# Patient Record
Sex: Female | Born: 1935 | Race: White | Hispanic: No | Smoking: Never smoker
Health system: Southern US, Community
[De-identification: ages and names within clinical notes are randomized; demographics above are authoritative.]

## PROBLEM LIST (undated history)

## (undated) DIAGNOSIS — M797 Fibromyalgia: Secondary | ICD-10-CM

## (undated) DIAGNOSIS — K219 Gastro-esophageal reflux disease without esophagitis: Secondary | ICD-10-CM

## (undated) DIAGNOSIS — K8020 Calculus of gallbladder without cholecystitis without obstruction: Secondary | ICD-10-CM

## (undated) DIAGNOSIS — G4700 Insomnia, unspecified: Secondary | ICD-10-CM

## (undated) DIAGNOSIS — E049 Nontoxic goiter, unspecified: Secondary | ICD-10-CM

## (undated) DIAGNOSIS — I10 Essential (primary) hypertension: Secondary | ICD-10-CM

## (undated) DIAGNOSIS — M1990 Unspecified osteoarthritis, unspecified site: Secondary | ICD-10-CM

## (undated) DIAGNOSIS — J189 Pneumonia, unspecified organism: Secondary | ICD-10-CM

## (undated) DIAGNOSIS — M549 Dorsalgia, unspecified: Secondary | ICD-10-CM

## (undated) DIAGNOSIS — M545 Low back pain, unspecified: Secondary | ICD-10-CM

## (undated) DIAGNOSIS — R7303 Prediabetes: Secondary | ICD-10-CM

## (undated) DIAGNOSIS — I82409 Acute embolism and thrombosis of unspecified deep veins of unspecified lower extremity: Secondary | ICD-10-CM

## (undated) DIAGNOSIS — E039 Hypothyroidism, unspecified: Secondary | ICD-10-CM

## (undated) DIAGNOSIS — H9319 Tinnitus, unspecified ear: Secondary | ICD-10-CM

## (undated) DIAGNOSIS — G8929 Other chronic pain: Secondary | ICD-10-CM

## (undated) DIAGNOSIS — R0602 Shortness of breath: Secondary | ICD-10-CM

## (undated) DIAGNOSIS — D689 Coagulation defect, unspecified: Secondary | ICD-10-CM

## (undated) DIAGNOSIS — E785 Hyperlipidemia, unspecified: Secondary | ICD-10-CM

## (undated) DIAGNOSIS — M041 Periodic fever syndromes: Secondary | ICD-10-CM

## (undated) DIAGNOSIS — Z8711 Personal history of peptic ulcer disease: Secondary | ICD-10-CM

## (undated) DIAGNOSIS — Z8719 Personal history of other diseases of the digestive system: Secondary | ICD-10-CM

## (undated) DIAGNOSIS — I809 Phlebitis and thrombophlebitis of unspecified site: Secondary | ICD-10-CM

## (undated) DIAGNOSIS — I2699 Other pulmonary embolism without acute cor pulmonale: Secondary | ICD-10-CM

## (undated) HISTORY — DX: Insomnia, unspecified: G47.00

## (undated) HISTORY — PX: HERNIA REPAIR: SHX51

## (undated) HISTORY — PX: OTHER SURGICAL HISTORY: SHX169

## (undated) HISTORY — PX: CATARACT EXTRACTION, BILATERAL: SHX1313

## (undated) HISTORY — DX: Hyperlipidemia, unspecified: E78.5

## (undated) HISTORY — PX: BREAST BIOPSY: SHX20

## (undated) HISTORY — PX: MIDDLE EAR SURGERY: SHX713

## (undated) HISTORY — DX: Coagulation defect, unspecified: D68.9

## (undated) HISTORY — DX: Other chronic pain: G89.29

## (undated) HISTORY — DX: Tinnitus, unspecified ear: H93.19

## (undated) HISTORY — DX: Dorsalgia, unspecified: M54.9

## (undated) HISTORY — DX: Acute embolism and thrombosis of unspecified deep veins of unspecified lower extremity: I82.409

## (undated) HISTORY — PX: EXTERNAL EAR SURGERY: SHX627

---

## 1958-06-09 HISTORY — PX: APPENDECTOMY: SHX54

## 1970-06-09 HISTORY — PX: ABDOMINAL HYSTERECTOMY: SHX81

## 1987-06-10 HISTORY — PX: CARPAL TUNNEL RELEASE: SHX101

## 1998-05-16 ENCOUNTER — Encounter: Admission: RE | Admit: 1998-05-16 | Discharge: 1998-08-14 | Payer: Self-pay | Admitting: Family Medicine

## 1998-08-15 ENCOUNTER — Ambulatory Visit (HOSPITAL_COMMUNITY): Admission: RE | Admit: 1998-08-15 | Discharge: 1998-08-15 | Payer: Self-pay | Admitting: Obstetrics & Gynecology

## 2000-11-10 ENCOUNTER — Ambulatory Visit (HOSPITAL_COMMUNITY): Admission: RE | Admit: 2000-11-10 | Discharge: 2000-11-10 | Payer: Self-pay | Admitting: *Deleted

## 2000-11-10 ENCOUNTER — Encounter: Payer: Self-pay | Admitting: *Deleted

## 2001-06-09 DIAGNOSIS — I2699 Other pulmonary embolism without acute cor pulmonale: Secondary | ICD-10-CM

## 2001-06-09 HISTORY — DX: Other pulmonary embolism without acute cor pulmonale: I26.99

## 2001-06-09 HISTORY — PX: THORACOTOMY: SUR1349

## 2001-06-25 ENCOUNTER — Encounter (INDEPENDENT_AMBULATORY_CARE_PROVIDER_SITE_OTHER): Payer: Self-pay | Admitting: *Deleted

## 2001-06-25 ENCOUNTER — Ambulatory Visit (HOSPITAL_COMMUNITY): Admission: RE | Admit: 2001-06-25 | Discharge: 2001-06-25 | Payer: Self-pay | Admitting: Gastroenterology

## 2001-11-29 ENCOUNTER — Encounter: Payer: Self-pay | Admitting: Family Medicine

## 2001-11-29 ENCOUNTER — Ambulatory Visit (HOSPITAL_COMMUNITY): Admission: RE | Admit: 2001-11-29 | Discharge: 2001-11-29 | Payer: Self-pay | Admitting: Family Medicine

## 2001-11-30 ENCOUNTER — Ambulatory Visit (HOSPITAL_COMMUNITY): Admission: RE | Admit: 2001-11-30 | Discharge: 2001-11-30 | Payer: Self-pay | Admitting: Family Medicine

## 2001-11-30 ENCOUNTER — Encounter: Payer: Self-pay | Admitting: Family Medicine

## 2001-12-07 HISTORY — PX: OTHER SURGICAL HISTORY: SHX169

## 2001-12-07 HISTORY — PX: MASS EXCISION: SHX2000

## 2001-12-09 ENCOUNTER — Ambulatory Visit (HOSPITAL_COMMUNITY)
Admission: RE | Admit: 2001-12-09 | Discharge: 2001-12-09 | Payer: Self-pay | Admitting: Thoracic Surgery (Cardiothoracic Vascular Surgery)

## 2001-12-24 ENCOUNTER — Encounter: Payer: Self-pay | Admitting: Thoracic Surgery (Cardiothoracic Vascular Surgery)

## 2001-12-28 ENCOUNTER — Encounter: Payer: Self-pay | Admitting: Thoracic Surgery (Cardiothoracic Vascular Surgery)

## 2001-12-28 ENCOUNTER — Encounter (INDEPENDENT_AMBULATORY_CARE_PROVIDER_SITE_OTHER): Payer: Self-pay | Admitting: *Deleted

## 2001-12-28 ENCOUNTER — Inpatient Hospital Stay (HOSPITAL_COMMUNITY)
Admission: RE | Admit: 2001-12-28 | Discharge: 2002-01-01 | Payer: Self-pay | Admitting: Thoracic Surgery (Cardiothoracic Vascular Surgery)

## 2001-12-29 ENCOUNTER — Encounter: Payer: Self-pay | Admitting: Thoracic Surgery (Cardiothoracic Vascular Surgery)

## 2001-12-30 ENCOUNTER — Encounter: Payer: Self-pay | Admitting: Thoracic Surgery (Cardiothoracic Vascular Surgery)

## 2001-12-31 ENCOUNTER — Encounter: Payer: Self-pay | Admitting: Thoracic Surgery (Cardiothoracic Vascular Surgery)

## 2001-12-31 ENCOUNTER — Encounter: Payer: Self-pay | Admitting: Internal Medicine

## 2002-01-01 ENCOUNTER — Encounter: Payer: Self-pay | Admitting: Thoracic Surgery (Cardiothoracic Vascular Surgery)

## 2002-01-19 ENCOUNTER — Encounter: Payer: Self-pay | Admitting: Thoracic Surgery (Cardiothoracic Vascular Surgery)

## 2002-01-19 ENCOUNTER — Encounter
Admission: RE | Admit: 2002-01-19 | Discharge: 2002-01-19 | Payer: Self-pay | Admitting: Thoracic Surgery (Cardiothoracic Vascular Surgery)

## 2002-01-19 ENCOUNTER — Inpatient Hospital Stay (HOSPITAL_COMMUNITY)
Admission: AD | Admit: 2002-01-19 | Discharge: 2002-01-25 | Payer: Self-pay | Admitting: Thoracic Surgery (Cardiothoracic Vascular Surgery)

## 2002-02-09 ENCOUNTER — Encounter: Payer: Self-pay | Admitting: Thoracic Surgery (Cardiothoracic Vascular Surgery)

## 2002-02-09 ENCOUNTER — Encounter
Admission: RE | Admit: 2002-02-09 | Discharge: 2002-02-09 | Payer: Self-pay | Admitting: Thoracic Surgery (Cardiothoracic Vascular Surgery)

## 2002-03-02 ENCOUNTER — Ambulatory Visit (HOSPITAL_COMMUNITY): Admission: RE | Admit: 2002-03-02 | Discharge: 2002-03-02 | Payer: Self-pay

## 2002-05-02 ENCOUNTER — Ambulatory Visit (HOSPITAL_COMMUNITY): Admission: RE | Admit: 2002-05-02 | Discharge: 2002-05-02 | Payer: Self-pay | Admitting: Family Medicine

## 2002-05-02 ENCOUNTER — Encounter: Payer: Self-pay | Admitting: Family Medicine

## 2002-08-25 ENCOUNTER — Ambulatory Visit: Admission: RE | Admit: 2002-08-25 | Discharge: 2002-08-25 | Payer: Self-pay | Admitting: Family Medicine

## 2002-11-02 ENCOUNTER — Ambulatory Visit (HOSPITAL_COMMUNITY): Admission: RE | Admit: 2002-11-02 | Discharge: 2002-11-02 | Payer: Self-pay | Admitting: Family Medicine

## 2002-11-02 ENCOUNTER — Encounter: Payer: Self-pay | Admitting: Family Medicine

## 2003-06-27 ENCOUNTER — Emergency Department (HOSPITAL_COMMUNITY): Admission: EM | Admit: 2003-06-27 | Discharge: 2003-06-28 | Payer: Self-pay | Admitting: Emergency Medicine

## 2003-06-30 ENCOUNTER — Inpatient Hospital Stay (HOSPITAL_COMMUNITY): Admission: RE | Admit: 2003-06-30 | Discharge: 2003-07-07 | Payer: Self-pay | Admitting: Family Medicine

## 2003-07-03 ENCOUNTER — Encounter (INDEPENDENT_AMBULATORY_CARE_PROVIDER_SITE_OTHER): Payer: Self-pay | Admitting: Cardiology

## 2004-08-05 ENCOUNTER — Encounter (INDEPENDENT_AMBULATORY_CARE_PROVIDER_SITE_OTHER): Payer: Self-pay | Admitting: *Deleted

## 2004-08-05 ENCOUNTER — Ambulatory Visit (HOSPITAL_COMMUNITY): Admission: RE | Admit: 2004-08-05 | Discharge: 2004-08-05 | Payer: Self-pay | Admitting: Gastroenterology

## 2006-11-13 ENCOUNTER — Ambulatory Visit: Payer: Self-pay | Admitting: Vascular Surgery

## 2010-10-25 NOTE — Discharge Summary (Signed)
[place]. [facility]  Patient:    Debbie Richards, Debbie Richards Visit Number: [id number] MRN: [id number]          Service Type: SUR Location: 2000 2015 01 Attending Physician:  Charlett Lango Dictated by:   Adair Patter, P.A. Admit Date:  12/28/2001 Discharge Date: 01/01/2002                             Discharge Summary  DATE OF BIRTH:  [birth year]  ADMITTING DIAGNOSES: 1. Paravertebral mass. 2. Anterior mediastinal mass.  DISCHARGE DIAGNOSES: 1. Bronchogenic cyst. 2. Thyroid goiter.  HOSPITAL COURSE:  The patient was admitted to [facility] on December 28, 2001, after being seen and evaluated by Dr. Viviann Spare C. Hendrickson for a paravertebral mass and anterior mediastinal mass.  Because of this, Dr. Dorris Fetch performed a right video-assisted thoracoscopy with mini-thoracotomy and resection of paravertebral mass and a needle biopsy of the anterior mediastinal mass.  No complications noted during the procedure. Postoperatively, the patient had a relatively uneventful hospital course.  She was able to tolerate regular diet postoperatively.  Pathology revealed that she had a bronchogenic cyst and thyroid goiter.  Hemoglobin and hematocrit were stable at 11.2 and 33.7.  BUN and creatinine were 9 and 1.0.  She was subsequently deemed stable for discharge on January 01, 2002.  DISCHARGE MEDICATIONS: 1. Tylox 1-2 tablets every 4-6 hours as needed for pain. 2. Flexeril 10 mg one daily at bedtime. 3. Premarin 0.625 mg one daily. 4. Atenolol 25 mg one daily. 5. Protonix 40 mg one daily. 6. Colchicine 0.6 mg one daily. 7. Voltaren 75 mg one tablet twice daily.  DISCHARGE ACTIVITIES:  The patient was told no driving, no strenuous activity or lifting heavy objects.  She was told to walk daily and continue breathing exercises.  DISCHARGE DIET:  Low fat and low salt.  WOUND CARE:  The patient was told she could shower and clean her incision with soap and  water.  DISPOSITION:  Home.  FOLLOWUP:  The patient was told to see Dr. Dorris Fetch on January 19, 2002 at 10:45 a.m.  She was told to go to the [facility] one hour before this appointment for chest x-ray. Dictated by:   Adair Patter, P.A. Attending Physician:  Charlett Lango DD:  12/31/01 TD:  01/04/02 Job: [id number]

## 2010-10-25 NOTE — Op Note (Signed)
[place]. [facility]  Patient:    Debbie Richards, Debbie Richards Visit Number: [id number] MRN: [id number]          Service Type: SUR Location: 2000 2015 01 Attending Physician:  Charlett Lango Proc. Date: 12/28/01 Admit Date:  12/28/2001                             Operative Report  INDICATION:  [name] is a 77 year old white female with the diagnosis of a paravertebral chest mass who is scheduled for thoracotomy/thoracoscopy with resection or biopsy of the chest mass and for whom epidural anesthesia required in the postoperative period.  DESCRIPTION OF PROCEDURE:  After termination of surgery still under general anesthesia, [name] remains in the left lateral decubitus position.  Her back is prepped with Betadine and draped in the usual sterile fashion.  Using a midline approach and loss of resistance technique, a peritoneal tap was accomplished at the T11 and T12 interspace with the 17 gauge Tuohy needle. After aspiration for blood and CSF were negative, a total of 10 cc of 1% lidocaine were infused.  This was followed by the passage of a peridural catheter at 20 cc cephalad.  The catheter was then checked for patency and affixed to the patients back.  The patient was then returned to the supine position, awakened, extubated, and brought to the post anesthesia care unit where her peridural catheter will be connected to a mixture of fentanyl 5 mcg per cc and Marcaine at an initial rate of 12 cc an hour to be adjusted as needed.  There were no complications.  She did do well and will be followed in the usual fashion. Attending Physician:  Charlett Lango DD:  12/28/01 TD:  12/31/01 Job: [id number]

## 2010-10-25 NOTE — Discharge Summary (Signed)
   NAME:  Debbie Richards, Debbie Richards                        ACCOUNT NO.:  [id number]   MEDICAL RECORD NO.:  [id number]                   PATIENT TYPE:  INP   LOCATION:  5742                                 FACILITY:  [facility]   PHYSICIAN:  Myrlene Broker, P.A.                DATE OF BIRTH:  1930/06/25   DATE OF ADMISSION:  01/19/2002  DATE OF DISCHARGE:  01/25/2002                                 DISCHARGE SUMMARY   ADDENDUM:  This patient was initially ready for discharge pending a  therapeutic INR on January 24, 2002. Nevertheless her INR was only 1.7.  On  January 25, 2002, her INR is 2.1. She will be going home today in stable  condition with her primary care physician following her Coumadin. She was  instructed to have her Coumadin level checked in a couple days at her  primary care physician's. She was to take 10 mg on January 25, 2002, and she  will have her blood drawn tomorrow at Dr. Otelia Santee office, and he  will let her know how much to take after that. All other medications are the  same. Her office will notify her of a followup time.                                               Myrlene Broker, P.A.    CML/MEDQ  D:  01/25/2002  T:  01/27/2002  Job:  [id number]   cc:   Meredith Staggers, M.D.

## 2010-10-25 NOTE — Op Note (Signed)
[place]. [facility]  Patient:    Debbie Richards, Debbie Richards Visit Number: [id number] MRN: [id number]          Service Type: SUR Location: 2000 2015 01 Attending Physician:  Charlett Lango Dictated by:   Salvatore Decent. Dorris Fetch, M.D. Proc. Date: 12/28/01 Admit Date:  12/28/2001 Discharge Date: 01/01/2002   CC:         Meredith Staggers, M.D.   Operative Report  PREOPERATIVE DIAGNOSIS:  Posterior mediastinal mass.  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  PROCEDURE: 1. Right thoracotomy with video assistance. 2. Resection of esophageal duplication cyst. 3. Biopsy of anterior mediastinal mass.  SURGEON:  Salvatore Decent. Dorris Fetch, M.D.  ASSISTANTS: 1. D. Karle Plumber, M.D. 2. Lissa Merlin, P.A.  ANESTHESIA:  General.  FINDINGS:  Approximately 4 cm in diameter thin-walled simple cyst in the posterior mediastinum, in close proximity to the esophagus.  No true communication with bronchus or esophagus identified.  Tru-Cut needle biopsies performed of anterior mediastinal mass which was vascular and had the appearance of thyroid.  CLINICAL NOTE:  The patient is a 77 year old female who presented with right-sided rib pain.  Her evaluation included a chest x-ray which showed possible mediastinal adenopathy.  A CT scan was performed which revealed a posterior mediastinal mass in a paravertebral location, as well as a right peritracheal mass which appeared to be in continuity with the thyroid gland. The patient was referred for diagnosis and treatment.  It is felt that the symptoms most likely were related to the posterior mediastinal mass given their radicular nature.  The plan was for resection of the posterior mass with a possible attempt at biopsy of the anterior mediastinal mass if possible. The indications, risks, benefits and alternative  treatments were discussed in detail with the patient.  She understood and accepted the risks and agreed to proceed.  OPERATIVE NOTE:  The patient was brought to the preop holding area on 12/28/01. Intravenous access was obtained.  Arterial blood pressure monitoring catheter was placed.  The patient was taken to the operating room, anesthetized and intubated with a double-lumen endotracheal tube.  Intravenous antibiotics were administered.  PAS hose were placed for DVT prophylaxis.  A Foley catheter was placed.  The patient was placed in the left lateral decubitus position, and the right chest was prepped and draped in usual fashion.  Single lung ventilation was carried out.  The left lung was ventilated.  The right lung was deflated.  Transverse incision was made in approximately the seventh intercostal space, carried through the skin and subcutaneous tissue.  The chest then was entered bluntly using a hemostat.  A port was inserted and the thoracoscope was inserted.  There was good exposure with good deflation of the right lung. Additional incisions were made in approximately the fifth interspace anteriorly and posteriorly for instrumentation.  In both cases, the chest was entered using thoracoscopic visualization.  The right upper lobe was grasped and retracted anteromedially to expose the right paravertebral space.  There was a smooth wall of what appeared to be a simple cyst in that region. Dr. Franky Macho from neurosurgery was asked to see this and evaluate the CT scans, to make sure this was not a syringocele prior to resection.  He felt that was unlikely given its location anterior to the vertebral body.  This most likely represented a bronchogenic cyst or esophageal duplication cyst.  The pleura was incised around the cyst.  There were large feeding veins which were displaced by the cyst, but the veins themselves were not in communication with the cyst or part of the cyst wall.  These  were doubly clipped and divided. During the dissection of the cyst, the thin wall ruptured.  There was clear fluid within the cyst with some fibrinous material as well.  The azygous vein was very carefully dissected off the cyst.  Bleeding from the azygous vein at one point in the dissection required repair with a 4-0 Prolene figure-of-eight suture.  The cyst then was continued to be dissected out using sharp and blunt dissection.  It was in close proximity to the esophagus but no true communication with the esophagus or bronchus could be established.  The base of the cyst was suture ligated with a 2-0 silk suture, and the cyst was removed and sent for permanent pathology.  Hemostasis was achieved.  Next the pleura was incised over the right peritracheal region.  Multiple normal-appearing lymph nodes were identified.  The right peritracheal mass was identified.  This appeared consistent with thyroid tissue.  Tru-Cut needle biopsies were performed and sent for permanent section.  Bleeding was controlled with direct pressure using a sponge stick.  The chest was irrigated with one liter of warm normal saline containing 1 g of vancomycin.  Hemostasis was achieved.  A single 36 French right-angled chest tube was placed through the seventh intercostal space incision and directed posteriorly along the paravertebral gutter overlying the esophagus near the dissection.  It was secured with a #1 silk suture.  The fourth interspace thoracotomy then was closed with interrupted #2 Vicryl pericostal sutures.  The lung was reinflated.  The serratus muscle was reattached laterally using a running 0 Vicryl suture.  The serratus was spared with a thoracotomy.  The latissimus fascia was closed with a running #1 Vicryl suture.  Subcutaneous tissue was closed with running 2-0 Vicryl suture, and the skin was closed with a 3-0 Vicryl subcuticular suture.  The remaining anterior port site was closed in standard  fashion with a 2-0 Vicryl fascial suture, a subcutaneous suture and a  3-0 Vicryl subcuticular suture.  The patient remained hemodynamically stable.  Intercostal blocks were performed with 0.25% Marcaine and epidural catheter then was placed by the anesthesiologist for postoperative pain control.  The patient was placed back in the supine position, was extubated in the operating room and taken to the postanesthetic care unit in stable condition.  All sponge, needle and instrument counts were correct at the end of the procedure.  There were no intraoperative complications. Dictated by:   Salvatore Decent Dorris Fetch, M.D. Attending Physician:  Charlett Lango DD:  12/28/01 TD:  12/31/01 Job: [id number]

## 2010-10-25 NOTE — Discharge Summary (Signed)
NAME:  Debbie Richards, Debbie Richards                        ACCOUNT NO.:  [id number]   MEDICAL RECORD NO.:  [id number]                   PATIENT TYPE:  INP   LOCATION:  5742                                 FACILITY:  [facility]   PHYSICIAN:  Salvatore Decent. Dorris Fetch, M.D.         DATE OF BIRTH:  Dec 08, 1930   DATE OF ADMISSION:  01/19/2002  DATE OF DISCHARGE:  01/25/2002                                 DISCHARGE SUMMARY   PRIMARY ADMISSION DIAGNOSIS:  Right deep venous thrombosis.   SECONDARY DIAGNOSES/PAST MEDICAL HISTORY:  1. Hypertension.  2. __________ .  3. Hiatal hernia.  4. Osteoarthritis.  5. Status post right video-assisted thoracic surgery/mini-thoracotomy with     removal of bronchogenic cyst.   NEW DIAGNOSES/DISCHARGE DIAGNOSES:  1. Right deep venous thrombosis.  2. Hypertension.  3. __________ .  4. Hiatal hernia.  5. Osteoarthritis.  6. Status post right video-assisted thoracic surgery/mini-thoracotomy with     removal of bronchogenic cyst.   PROCEDURES:  None.   HOSPITAL COURSE:  The patient was admitted on January 19, 2002 after being  seen at a followup appointment at Dr. Viviann Spare C. Hendrickson's office and was  found to have some right lower extremity swelling.  A venous Duplex was done  and revealed a right deep venous thrombosis.  The patient was admitted on  August 13th for IV heparinization, to keep her legs elevated and bedrest.  Heparin was started as well as Coumadin and the patient remained stable from  a cardiac as well as pulmonary standpoint.  She was ambulating without  difficulty, tolerating a regular diet.  Her INR had slowly increased.  Upon  the time it is greater than 2.0, she will be stable for discharge, which is  anticipated on January 24, 2002.   DISCHARGE CONDITION:  Stable.   DISCHARGE MEDICATIONS:  1. Flexeril 10 mg p.o. daily.  2. Premarin 0.625 mg daily.  3. Atenolol 25 mg daily.  4. Protonix 30 mg daily.  5. Colchicine 0.5 mg daily.  6.  Voltaren 75 mg twice a day.  7. Darvocet one to two tablets every four to six hours as needed for pain.   ACTIVITY:  The patient is instructed not to do any strenuous activity and to  keep her legs elevated.   DIET:  She is to continue her diet as before.   WOUND CARE:  There is no applicable wound care.   SPECIAL DISCHARGE INSTRUCTIONS:  She is to notify the office of any  temperature of 101 or if there is any increased swelling or pain in her legs  or if she has any increased shortness of breath.   FOLLOWUP:  She is to see Dr. Dorris Fetch in approximately two weeks and our  office will call her with that appointment date and time.     Myrlene Broker, P.A.  Salvatore Decent Dorris Fetch, M.D.    CML/MEDQ  D:  01/23/2002  T:  01/26/2002  Job:  [id number]

## 2010-10-25 NOTE — H&P (Signed)
NAME:  Debbie Richards, Debbie Richards                        ACCOUNT NO.:  [id number]   MEDICAL RECORD NO.:  [id number]                   PATIENT TYPE:  INP   LOCATION:  5742                                 FACILITY:  [facility]   PHYSICIAN:  Myrlene Broker, P.A.                DATE OF BIRTH:  15-Mar-1931   DATE OF ADMISSION:  01/19/2002  DATE OF DISCHARGE:                                HISTORY & PHYSICAL   CHIEF COMPLAINT:  Right deep venous thrombosis of the lower extremity.   HISTORY OF PRESENT ILLNESS:  This is a 77 year old Caucasian female who is  status post a right video assisted thoracoscopy with mini thoracotomy and  later diagnosed with a bronchogenic cyst.  She was sent home on January 01, 2002, after an unremarkable hospital admission.  Since she has been home,  she has been doing okay, although she has been having intermittent fevers  and her shortness of breath has persisted.  On follow-up visit today at Dr.  Viviann Spare C. Hendrickson's office, she was found to have a right lower  extremity deep venous thrombosis.   PAST MEDICAL HISTORY:  1. Hypertension.  2. Mediterranean fever.  3. Hiatal hernia.  4. Osteoarthritis.   PAST SURGICAL HISTORY:  1. On December 28, 2001, right VATS/mini thoracotomy.  2. Appendectomy.  3. Complete hysterectomy.  4. Three ear operations on the left side.  5. Ventral hernia repair.  6. Bladder tacking.  7. Carpal tunnel repair.   FAMILY HISTORY:  The patient's mother is deceased from congestive heart  failure at 6.  The patient's father is deceased from a stroke at 59.  The  patient's brother has heart disease.  There is no history of deep venous  thrombosis.   SOCIAL HISTORY:  The patient denies any tobacco, alcohol, or illicit drug  use.  She is widowed.  She has two children.  She is retired.  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   MEDICATIONS:  1. Flexeril 10 mg one tablet at bedtime.  2. Premarin 0.625 mg daily.  3. Atenolol 25 mg  daily.  4. Protonix 40 mg daily.  5. Colchicine 0.6 mg daily.  6. Voltaren 75 mg twice a day.   ALLERGIES:  No known drug allergies.   REVIEW OF SYMPTOMS:  GENERAL:  The patient states that she has had  intermittent fevers anywhere from 101-102 degrees since she has been home.  She denies any fatigue.  She does have some decreased appetite.  There have  been no weight changes.  HEENT:  Denies any headache, eyes, ears, nose, or  throat problems.  PULMONARY:  She does describe being short of breath with  exertion.  Denies any cough or hemoptysis.  She still is complaining of some  pain secondary to her surgery which inhibits her ability to take a deep  breath.  CARDIOVASCULAR:  She denies  any chest pain or unstable angina.  She  does have sharp pains intermittently, which is felt to be related to her  recent thoracotomy.  She denies any orthopnea, palpitations, history of  arrhythmia, coronary artery disease, or myocardial infarction.  GASTROINTESTINAL:  The patient denies any abdominal pain, history of GI  bleed, ulcers, or melena.  She does have a history of benign polyps which  have been removed, as well as a history of hemorrhoids.  GENITOURINARY:  She  denies any dysuria, hematuria, and frequency of urination.  ENDOCRINE:  She  denies any diabetes.  She does not have thyroid disease, although she does  have a benign growth on her thyroid gland.  RENAL:  She denies any history  of kidney disease.  CIRCULATORY:  Denies any history of peripheral vascular  disease or cranial, carotid, or cerebrovascular occlusive disease.  SKIN AND  MUSCULOSKELETAL:  She has a history of osteoarthritis.  No history of any  rashes.  NEUROLOGIC:  She denies any history of strokes, TIAs, amaurosis  fugax, or weakness over one side of her body more than the other.   PHYSICAL EXAMINATION:  VITAL SIGNS:  The blood pressure is 124/53, pulse 74,  respiratory rate 20, and temperature 97.9 degrees.  GENERAL  APPEARANCE:  This is a 77 year old Caucasian female who is mildly  obese.  She is in no acute distress.  She is alert and oriented x 3.  Sitting upright in a hospital bed.  HEENT:  The head is normocephalic and atraumatic.  The eyes are PERRLA/EOMI  without any cataracts, glaucoma, or macular degeneration.  NECK:  Supple without JVD, bruits, lymphadenopathy, or thyromegaly.  CHEST:  Clear to auscultation bilaterally without any rales, rhonchi, or  wheezes.  Her right lateral chest posteriorly demonstrates a well-healed  mini thoracotomy site, as well as chest tube side.  HEART:  Regular rate and rhythm without murmur, rub, or gallop.  ABDOMEN:  Mildly obese, soft, nontender, and nondistended.  She has positive  bowel sounds.  There are no masses.  EXTREMITIES:  The right lower extremity demonstrates swelling greater than  the left.  She does have calf tenderness in the right side, as well as  behind her knee.  She does have a positive Homan's sign.  She has 2+  dorsalis pedis and 2+ radial pulses bilaterally.  NEUROLOGIC:  She is alert and oriented x 3.  Her cranial nerves II-XII are  grossly intact.   LABORATORY DATA:  Currently pending.   IMPRESSION:  Right deep venous thrombosis, status post right mini  thoracotomy.   PLAN:  She is to be admitted to the hospital for IV heparinization, to keep  her legs elevated, and at bed rest for resolution of this deep venous  thrombosis, as well as investigation of this fever that she has been having.                                               Myrlene Broker, P.A.    CML/MEDQ  D:  01/19/2002  T:  01/22/2002  Job:  [id number]   cc:   [facility] Office

## 2010-10-25 NOTE — Op Note (Signed)
NAMENATSUMI, WHITSITT NO.:  [id number]   MEDICAL RECORD NO.:  [id number]          PATIENT TYPE:  AMB   LOCATION:  ENDO                         FACILITY:  [facility]   PHYSICIAN:  John C. Madilyn Fireman, M.D.    DATE OF BIRTH:  08-06-1930   DATE OF PROCEDURE:  08/05/2004  DATE OF DISCHARGE:                                 OPERATIVE REPORT   PROCEDURE:  Colonoscopy with polypectomy.   INDICATIONS FOR PROCEDURE:  History of adenomatous colon polyps on initial  colonoscopy three years ago.   DESCRIPTION OF PROCEDURE:  The patient was placed in the left lateral  decubitus position and placed on the pulse monitor with continuous low flow  oxygen delivered by nasal cannula.  She was sedated with 87.5 mcg of IV  Fentanyl and 8 mg of IV Versed.  The Olympus videocolonoscope was inserted  into the rectum and advanced to the cecum, confirmed by transillumination of  McBurney's point and visualization of the ileocecal valve and appendiceal  orifice.  The prep was excellent.  The cecum and ascending colon appeared  normal, with no masses, polyps, diverticula, or other mucosal abnormalities.  Within the transverse colon, there was an 8-mm sessile polyp which was  removed by snare.  The remainder of the transverse, descending, sigmoid, and  rectum appeared normal, with no further polyps, masses, diverticula, or  other mucosal abnormalities.  The scope was then withdrawn and the patient  returned to the recovery room in stable condition.  She tolerated the  procedure well, and there were no immediate complications.   IMPRESSION:  Transverse polyp.  Otherwise normal study.   PLAN:  Will await histology and probably repeat colonoscopy in five years.      JCH/MEDQ  D:  08/05/2004  T:  08/05/2004  Job:  [id number]   cc:   Deatra James, M.D.  Fax: [phone]

## 2010-10-25 NOTE — Procedures (Signed)
[facility]  Patient:    Debbie Richards, Debbie Richards Visit Number: [id number] MRN: [id number]          Service Type: END Location: ENDO Attending Physician:  Louie Bun Dictated by:   Everardo All Madilyn Fireman, M.D. Proc. Date: 06/25/01 Admit Date:  06/25/2001 Discharge Date: 06/25/2001   CC:         Meredith Staggers, M.D.   Procedure Report  PROCEDURE:  Colonoscopy with polypectomy.  INDICATION FOR PROCEDURE:  Screening colonoscopy in a 77 year old patient with no previous colon screening.  DESCRIPTION OF PROCEDURE:  The patient was placed in the left lateral decubitus position and placed on the pulse monitor with continuous low-flow oxygen delivered by nasal cannula.  She was sedated with 85 mg of IV Demerol and 7 mg of IV Versed.  The Olympus video colonoscope was inserted into the rectum and advanced to the cecum, confirmed by transillumination at McBurneys point and visualization of the ileocecal valve and appendiceal orifice.  The prep was excellent.  The cecum appeared normal with no masses, polyps, diverticula, or other mucosal abnormalities.  Within the ascending colon, there was seen an 8 mm sessile polyp which was fulgurated by hot biopsy.  The remainder of the ascending, transverse, and proximal descending colon appeared normal with no further polyps, masses, diverticula or other mucosal abnormalities.  Within the sigmoid colon there were seen several diverticula and no other abnormalities.  The rectum appeared normal, and retroflexed view of the anus revealed no obvious internal hemorrhoids.  The colonoscope was then withdrawn, and the patient returned to the recovery room in stable condition.  She tolerated the procedure well, and there were no immediate complications.  IMPRESSION: 1. Small ascending colon polyp. 2. Sigmoid diverticulosis.  PLAN:  Await histology for determination of method and interval for future colon  screening. Dictated by:   Everardo All Madilyn Fireman, M.D. Attending Physician:  Louie Bun DD:  06/25/01 TD:  06/27/01 Job: [id number]

## 2010-10-25 NOTE — Discharge Summary (Signed)
NAME:  Debbie Richards, Debbie Richards                        ACCOUNT NO.:  [id number]   MEDICAL RECORD NO.:  [id number]                   PATIENT TYPE:  INP   LOCATION:  0340                                 FACILITY:  [facility]   PHYSICIAN:  Deirdre Peer. Polite, M.D.              DATE OF BIRTH:  09/22/1930   DATE OF ADMISSION:  06/30/2003  DATE OF DISCHARGE:  07/07/2003                                 DISCHARGE SUMMARY   DISCHARGE DIAGNOSES:  1. Pulmonary embolus right upper lobe, right lower lobe. Left leg deep     venous thrombosis. INR at discharge 2.0.  2. History of goiter.  3. History of bronchogenic cyst.  4. History of __________ fever.  5. Hypertension.  6. History of direct visualization in August 2003.   DISCHARGE MEDICATIONS:  1. Coumadin 10 mg q.d. further dosing based on INR.  2. Atenolol 25 mg p.o. q.d.  3. Protonix 40 mg q.d.  4. __________ 0.6 mg q.d.  5. Synthroid 75 mcg q.d.   DISPOSITION:  The patient was discharged to home and asked to followup with  primary M.D. in one week.  The patient asked to have PT and INR measured in  one day.   HISTORY OF PRESENT ILLNESS:  A 77 year old female with above medical  problems who presented to the ED for evaluation of chest discomfort. The  patient had a CT which was significant for pulmonary embolus.  Admission was  recommended for further evaluation and treatment.   PAST MEDICAL HISTORY:  As stated above.   MEDICATIONS ON ADMISSION:  As stated above.   SOCIAL HISTORY:  Negative for tobacco, alcohol or drugs.   FAMILY HISTORY:  Noncontributory.   HOSPITAL COURSE:  #1.  PE, left leg DVT.  As stated Ms. Wilmott has had DVT in  the past August 2003 and now presents to the hospital for evaluation and has  been diagnosed with a PE.  The patient was treated with subcu Lovenox and  Coumadin. Because of no precipitating causes, hypocoagulable evaluation has  been obtained.  Studies are pending at the time of this dictation. The  patient's  hospital course was without complications; however, her INR was  very slow to rise. The patient was admitted on January 21. The patient's INR  did not become therapeutic until January 28.  Throughout this  hospitalization, there were no bleeding tendencies. As this was the  patient's second DVT, it is most likely recommended that she will require  prolonged i.e. lifelong anticoagulation.  As for the patient's  hypercoagulable studies, antithrombin 3 study 87 within normal limits,  protein C11, 70, protein C function 121,  protein S total 87, protein S function 97 above within normal limits.  Anticardiolipin antibody none detected.  As stated the patient is ready for  discharge at this time and will followup with her primary M.D. for further  evaluation of PT, INR.  Deirdre Peer. Polite, M.D.    RDP/MEDQ  D:  07/07/2003  T:  07/08/2003  Job:  [id number]

## 2010-10-25 NOTE — Discharge Summary (Signed)
   NAME:  Debbie Richards, Debbie Richards                        ACCOUNT NO.:  [id number]   MEDICAL RECORD NO.:  [id number]                   PATIENT TYPE:  INP   LOCATION:  2015                                 FACILITY:  [facility]   PHYSICIAN:  Salvatore Decent. Dorris Fetch, M.D.         DATE OF BIRTH:  25-Aug-1930   DATE OF ADMISSION:  12/28/2001  DATE OF DISCHARGE:  01/01/2002                                 DISCHARGE SUMMARY   ADMIT DIAGNOSES:  1. Paravertebral mass.  2. Anterior mediastinal mass.   DISCHARGE DIAGNOSES:  1. Paravertebral mass.  2. Anterior mediastinal mass.   HOSPITAL COURSE:  The patient was admitted to [facility] on December 28, 2001 after being seen and evaluated by Dr. Viviann Spare C. Hendrickson  secondary to admission diagnoses.  Because of this, Dr. Dorris Fetch  performed a right video-assisted thoracoscopy with minithoracotomy and  resection of paravertebral mass and needle biopsy of anterior mediastinal  mass.  No complications were noted during procedure.  Postoperatively, the  patient had an uneventful hospital course.  Her chest tubes were  discontinued appropriately.  Her pathology was consistent with a  bronchogenic cyst and the anterior mediastinal mass was consistent with a  thyroid goiter.  She was subsequently deemed stable for discharge home on  January 01, 2002.   MEDICATIONS AT TIME OF DISCHARGE:  1. Tylox one to two tablets every four to six hours as needed for pain.  2. Flexeril 10 mg one daily.  3. Premarin 0.625 mg one daily.  4. Atenolol 25 mg one daily.  5. Protonix 40 mg one daily.  6. Colchicine 0.6 mg one daily.  7. Voltaren 75 mg one tablet twice daily.   ACTIVITY:  The patient was told to avoid driving and strenuous activity  until seen in the office.  She was told to walk daily and continue breathing  exercises.   DISCHARGE DIET:  Low fat, low salt.   WOUND CARE:  The patient was told she could shower and clean her incision  with soap and  water.   DISPOSITION:  Home.   FOLLOWUP:  The patient was told to see Dr. Dorris Fetch on Wednesday, January 19, 2002 at 10:45 a.m.  She was told to go to the [facility] one hour before this appointment for chest x-ray.    Levin Erp. Steward, P.A.                      Salvatore Decent Dorris Fetch, M.D.   Henrene Hawking  D:  02/09/2002  T:  02/10/2002  Job:  [id number]

## 2011-07-03 DIAGNOSIS — Z7901 Long term (current) use of anticoagulants: Secondary | ICD-10-CM | POA: Diagnosis not present

## 2011-07-03 DIAGNOSIS — E7800 Pure hypercholesterolemia, unspecified: Secondary | ICD-10-CM | POA: Diagnosis not present

## 2011-07-31 DIAGNOSIS — E7800 Pure hypercholesterolemia, unspecified: Secondary | ICD-10-CM | POA: Diagnosis not present

## 2011-07-31 DIAGNOSIS — Z131 Encounter for screening for diabetes mellitus: Secondary | ICD-10-CM | POA: Diagnosis not present

## 2011-07-31 DIAGNOSIS — I4891 Unspecified atrial fibrillation: Secondary | ICD-10-CM | POA: Diagnosis not present

## 2011-08-14 DIAGNOSIS — Z7901 Long term (current) use of anticoagulants: Secondary | ICD-10-CM | POA: Diagnosis not present

## 2011-08-22 DIAGNOSIS — Z7901 Long term (current) use of anticoagulants: Secondary | ICD-10-CM | POA: Diagnosis not present

## 2011-08-29 DIAGNOSIS — Z7901 Long term (current) use of anticoagulants: Secondary | ICD-10-CM | POA: Diagnosis not present

## 2011-08-29 DIAGNOSIS — I4891 Unspecified atrial fibrillation: Secondary | ICD-10-CM | POA: Diagnosis not present

## 2011-09-09 ENCOUNTER — Encounter (HOSPITAL_COMMUNITY): Payer: Self-pay

## 2011-09-09 ENCOUNTER — Inpatient Hospital Stay (HOSPITAL_COMMUNITY)
Admission: EM | Admit: 2011-09-09 | Discharge: 2011-09-18 | DRG: 418 | Disposition: A | Discharge status: Home / Self Care | Payer: Medicare Other | Attending: General Surgery | Admitting: General Surgery

## 2011-09-09 ENCOUNTER — Emergency Department (HOSPITAL_COMMUNITY): Payer: Medicare Other

## 2011-09-09 ENCOUNTER — Other Ambulatory Visit: Payer: Self-pay

## 2011-09-09 DIAGNOSIS — R079 Chest pain, unspecified: Secondary | ICD-10-CM | POA: Diagnosis not present

## 2011-09-09 DIAGNOSIS — E049 Nontoxic goiter, unspecified: Secondary | ICD-10-CM | POA: Insufficient documentation

## 2011-09-09 DIAGNOSIS — D696 Thrombocytopenia, unspecified: Secondary | ICD-10-CM | POA: Diagnosis present

## 2011-09-09 DIAGNOSIS — K8000 Calculus of gallbladder with acute cholecystitis without obstruction: Secondary | ICD-10-CM | POA: Diagnosis present

## 2011-09-09 DIAGNOSIS — I2789 Other specified pulmonary heart diseases: Secondary | ICD-10-CM | POA: Diagnosis present

## 2011-09-09 DIAGNOSIS — K8066 Calculus of gallbladder and bile duct with acute and chronic cholecystitis without obstruction: Secondary | ICD-10-CM | POA: Diagnosis not present

## 2011-09-09 DIAGNOSIS — K8020 Calculus of gallbladder without cholecystitis without obstruction: Secondary | ICD-10-CM | POA: Diagnosis not present

## 2011-09-09 DIAGNOSIS — I2782 Chronic pulmonary embolism: Secondary | ICD-10-CM | POA: Diagnosis not present

## 2011-09-09 DIAGNOSIS — I82509 Chronic embolism and thrombosis of unspecified deep veins of unspecified lower extremity: Secondary | ICD-10-CM | POA: Diagnosis present

## 2011-09-09 DIAGNOSIS — Z86718 Personal history of other venous thrombosis and embolism: Secondary | ICD-10-CM | POA: Diagnosis not present

## 2011-09-09 DIAGNOSIS — IMO0001 Reserved for inherently not codable concepts without codable children: Secondary | ICD-10-CM | POA: Diagnosis present

## 2011-09-09 DIAGNOSIS — K8050 Calculus of bile duct without cholangitis or cholecystitis without obstruction: Secondary | ICD-10-CM | POA: Diagnosis present

## 2011-09-09 DIAGNOSIS — R0989 Other specified symptoms and signs involving the circulatory and respiratory systems: Secondary | ICD-10-CM | POA: Diagnosis not present

## 2011-09-09 DIAGNOSIS — R072 Precordial pain: Secondary | ICD-10-CM | POA: Diagnosis not present

## 2011-09-09 DIAGNOSIS — Z7901 Long term (current) use of anticoagulants: Secondary | ICD-10-CM

## 2011-09-09 DIAGNOSIS — Z01810 Encounter for preprocedural cardiovascular examination: Secondary | ICD-10-CM | POA: Diagnosis not present

## 2011-09-09 DIAGNOSIS — I10 Essential (primary) hypertension: Secondary | ICD-10-CM | POA: Diagnosis present

## 2011-09-09 DIAGNOSIS — I517 Cardiomegaly: Secondary | ICD-10-CM | POA: Diagnosis not present

## 2011-09-09 DIAGNOSIS — K8010 Calculus of gallbladder with chronic cholecystitis without obstruction: Secondary | ICD-10-CM | POA: Diagnosis not present

## 2011-09-09 DIAGNOSIS — R0609 Other forms of dyspnea: Secondary | ICD-10-CM | POA: Diagnosis not present

## 2011-09-09 DIAGNOSIS — R109 Unspecified abdominal pain: Secondary | ICD-10-CM | POA: Diagnosis not present

## 2011-09-09 DIAGNOSIS — R112 Nausea with vomiting, unspecified: Secondary | ICD-10-CM | POA: Diagnosis not present

## 2011-09-09 DIAGNOSIS — Z86711 Personal history of pulmonary embolism: Secondary | ICD-10-CM | POA: Insufficient documentation

## 2011-09-09 DIAGNOSIS — M797 Fibromyalgia: Secondary | ICD-10-CM | POA: Insufficient documentation

## 2011-09-09 DIAGNOSIS — K828 Other specified diseases of gallbladder: Secondary | ICD-10-CM | POA: Diagnosis not present

## 2011-09-09 DIAGNOSIS — R0602 Shortness of breath: Secondary | ICD-10-CM | POA: Diagnosis not present

## 2011-09-09 DIAGNOSIS — I4891 Unspecified atrial fibrillation: Secondary | ICD-10-CM | POA: Diagnosis not present

## 2011-09-09 HISTORY — DX: Other chronic pain: G89.29

## 2011-09-09 HISTORY — DX: Pneumonia, unspecified organism: J18.9

## 2011-09-09 HISTORY — DX: Nontoxic goiter, unspecified: E04.9

## 2011-09-09 HISTORY — DX: Personal history of peptic ulcer disease: Z87.11

## 2011-09-09 HISTORY — DX: Gastro-esophageal reflux disease without esophagitis: K21.9

## 2011-09-09 HISTORY — DX: Prediabetes: R73.03

## 2011-09-09 HISTORY — DX: Personal history of other diseases of the digestive system: Z87.19

## 2011-09-09 HISTORY — DX: Fibromyalgia: M79.7

## 2011-09-09 HISTORY — DX: Shortness of breath: R06.02

## 2011-09-09 HISTORY — DX: Low back pain: M54.5

## 2011-09-09 HISTORY — DX: Essential (primary) hypertension: I10

## 2011-09-09 HISTORY — DX: Low back pain, unspecified: M54.50

## 2011-09-09 HISTORY — DX: Phlebitis and thrombophlebitis of unspecified site: I80.9

## 2011-09-09 HISTORY — DX: Other pulmonary embolism without acute cor pulmonale: I26.99

## 2011-09-09 HISTORY — DX: Acute embolism and thrombosis of unspecified deep veins of unspecified lower extremity: I82.409

## 2011-09-09 HISTORY — DX: Periodic fever syndromes: M04.1

## 2011-09-09 HISTORY — DX: Unspecified osteoarthritis, unspecified site: M19.90

## 2011-09-09 HISTORY — DX: Hypothyroidism, unspecified: E03.9

## 2011-09-09 HISTORY — DX: Calculus of gallbladder without cholecystitis without obstruction: K80.20

## 2011-09-09 LAB — COMPREHENSIVE METABOLIC PANEL
ALT: 12 U/L (ref 0–35)
AST: 24 U/L (ref 0–37)
Albumin: 3.9 g/dL (ref 3.5–5.2)
Alkaline Phosphatase: 59 U/L (ref 39–117)
BUN: 21 mg/dL (ref 6–23)
CO2: 23 mEq/L (ref 19–32)
Calcium: 9.5 mg/dL (ref 8.4–10.5)
Chloride: 103 mEq/L (ref 96–112)
Creatinine, Ser: 0.71 mg/dL (ref 0.50–1.10)
GFR calc Af Amer: >90 mL/min (ref >90)
GFR calc non Af Amer: 80 mL/min — ABNORMAL LOW (ref >90)
Glucose, Bld: 159 mg/dL — ABNORMAL HIGH (ref 70–99)
Potassium: 3.9 mEq/L (ref 3.5–5.1)
Sodium: 139 mEq/L (ref 135–145)
Total Bilirubin: 0.5 mg/dL (ref 0.3–1.2)
Total Protein: 6.7 g/dL (ref 6.0–8.3)

## 2011-09-09 LAB — CBC
HCT: 43.3 % (ref 36.0–46.0)
Hemoglobin: 14.5 g/dL (ref 12.0–15.0)
MCH: 31.4 pg (ref 26.0–34.0)
MCHC: 33.5 g/dL (ref 30.0–36.0)
MCV: 93.7 fL (ref 78.0–100.0)
Platelets: 149 10*3/uL — ABNORMAL LOW (ref 150–400)
RBC: 4.62 MIL/uL (ref 3.87–5.11)
RDW: 12.5 % (ref 11.5–15.5)
WBC: 9.8 10*3/uL (ref 4.0–10.5)

## 2011-09-09 LAB — URINALYSIS, ROUTINE W REFLEX MICROSCOPIC
Bilirubin Urine: NEGATIVE
Glucose, UA: NEGATIVE mg/dL
Ketones, ur: NEGATIVE mg/dL
Nitrite: NEGATIVE
Protein, ur: NEGATIVE mg/dL
Specific Gravity, Urine: 1.03 (ref 1.005–1.030)
Urobilinogen, UA: 0.2 mg/dL (ref 0.0–1.0)
pH: 5 (ref 5.0–8.0)

## 2011-09-09 LAB — DIFFERENTIAL
Basophils Absolute: 0 10*3/uL (ref 0.0–0.1)
Basophils Relative: 0 % (ref 0–1)
Eosinophils Absolute: 0 10*3/uL (ref 0.0–0.7)
Eosinophils Relative: 0 % (ref 0–5)
Lymphocytes Relative: 12 % (ref 12–46)
Lymphs Abs: 1.2 10*3/uL (ref 0.7–4.0)
Monocytes Absolute: 0.4 10*3/uL (ref 0.1–1.0)
Monocytes Relative: 4 % (ref 3–12)
Neutro Abs: 8.2 10*3/uL — ABNORMAL HIGH (ref 1.7–7.7)
Neutrophils Relative %: 84 % — ABNORMAL HIGH (ref 43–77)

## 2011-09-09 LAB — HEPARIN LEVEL (UNFRACTIONATED): Heparin Unfractionated: 0.43 IU/mL (ref 0.30–0.70)

## 2011-09-09 LAB — URINE MICROSCOPIC-ADD ON

## 2011-09-09 LAB — PRO B NATRIURETIC PEPTIDE: Pro B Natriuretic peptide (BNP): 333.5 pg/mL (ref 0–450)

## 2011-09-09 LAB — PROTIME-INR
INR: 1.65 — ABNORMAL HIGH (ref 0.00–1.49)
Prothrombin Time: 19.8 seconds — ABNORMAL HIGH (ref 11.6–15.2)

## 2011-09-09 LAB — LIPASE, BLOOD: Lipase: 35 U/L (ref 11–59)

## 2011-09-09 MED ORDER — HYDROMORPHONE HCL PF 1 MG/ML IJ SOLN
1.0000 mg | Freq: Once | INTRAMUSCULAR | Status: AC
  Administered 2011-09-09: 1 mg via INTRAVENOUS
  Filled 2011-09-09: qty 1

## 2011-09-09 MED ORDER — HEPARIN (PORCINE) IN NACL 100-0.45 UNIT/ML-% IJ SOLN
1350.0000 [IU]/h | INTRAMUSCULAR | Status: AC
  Administered 2011-09-09 – 2011-09-10 (×2): 1350 [IU]/h via INTRAVENOUS
  Filled 2011-09-09 (×3): qty 250

## 2011-09-09 MED ORDER — SODIUM CHLORIDE 0.9 % IV SOLN
Freq: Once | INTRAVENOUS | Status: AC
  Administered 2011-09-09: 04:00:00 via INTRAVENOUS

## 2011-09-09 MED ORDER — MORPHINE SULFATE 2 MG/ML IJ SOLN: 1.0000 mg | INTRAMUSCULAR | Status: DC | PRN

## 2011-09-09 MED ORDER — DIPHENHYDRAMINE HCL 50 MG/ML IJ SOLN: 12.5000 mg | Freq: Four times a day (QID) | INTRAMUSCULAR | Status: DC | PRN

## 2011-09-09 MED ORDER — ACETAMINOPHEN 650 MG RE SUPP: 650.0000 mg | Freq: Four times a day (QID) | RECTAL | Status: DC | PRN

## 2011-09-09 MED ORDER — ONDANSETRON HCL 4 MG/2ML IJ SOLN
4.0000 mg | Freq: Four times a day (QID) | INTRAMUSCULAR | Status: DC | PRN
  Administered 2011-09-09: 4 mg via INTRAVENOUS
  Filled 2011-09-09: qty 2

## 2011-09-09 MED ORDER — KCL IN DEXTROSE-NACL 20-5-0.45 MEQ/L-%-% IV SOLN: INTRAVENOUS | Status: DC

## 2011-09-09 MED ORDER — LEVOTHYROXINE SODIUM 75 MCG PO TABS
75.0000 ug | ORAL_TABLET | Freq: Every day | ORAL | Status: DC
  Filled 2011-09-09: qty 1

## 2011-09-09 MED ORDER — ATENOLOL 25 MG PO TABS
25.0000 mg | ORAL_TABLET | Freq: Every day | ORAL | Status: DC
  Administered 2011-09-09 – 2011-09-11 (×3): 25 mg via ORAL
  Filled 2011-09-09 (×4): qty 1

## 2011-09-09 MED ORDER — PANTOPRAZOLE SODIUM 40 MG IV SOLR: 40.0000 mg | Freq: Every day | INTRAVENOUS | Status: DC

## 2011-09-09 MED ORDER — ONDANSETRON HCL 4 MG/2ML IJ SOLN
4.0000 mg | Freq: Once | INTRAMUSCULAR | Status: AC
  Administered 2011-09-09: 4 mg via INTRAVENOUS
  Filled 2011-09-09: qty 2

## 2011-09-09 MED ORDER — CHLORHEXIDINE GLUCONATE 0.12 % MT SOLN
15.0000 mL | Freq: Two times a day (BID) | OROMUCOSAL | Status: DC
  Administered 2011-09-09 – 2011-09-11 (×5): 15 mL via OROMUCOSAL
  Filled 2011-09-09 (×4): qty 15

## 2011-09-09 MED ORDER — DIPHENHYDRAMINE HCL 12.5 MG/5ML PO ELIX: 12.5000 mg | ORAL_SOLUTION | Freq: Four times a day (QID) | ORAL | Status: DC | PRN

## 2011-09-09 MED ORDER — KCL IN DEXTROSE-NACL 20-5-0.45 MEQ/L-%-% IV SOLN
INTRAVENOUS | Status: DC
  Administered 2011-09-09 – 2011-09-12 (×7): via INTRAVENOUS
  Administered 2011-09-13: 20 mL/h via INTRAVENOUS
  Administered 2011-09-13: 02:00:00 via INTRAVENOUS
  Filled 2011-09-09 (×15): qty 1000

## 2011-09-09 MED ORDER — ACETAMINOPHEN 325 MG PO TABS: 650.0000 mg | ORAL_TABLET | Freq: Four times a day (QID) | ORAL | Status: DC | PRN

## 2011-09-09 MED ORDER — ONDANSETRON HCL 4 MG/2ML IJ SOLN: 4.0000 mg | Freq: Four times a day (QID) | INTRAMUSCULAR | Status: DC | PRN

## 2011-09-09 MED ORDER — IOHEXOL 300 MG/ML  SOLN
100.0000 mL | Freq: Once | INTRAMUSCULAR | Status: AC | PRN
  Administered 2011-09-09: 100 mL via INTRAVENOUS

## 2011-09-09 MED ORDER — BIOTENE DRY MOUTH MT LIQD
15.0000 mL | Freq: Two times a day (BID) | OROMUCOSAL | Status: DC
  Administered 2011-09-09 – 2011-09-11 (×5): 15 mL via OROMUCOSAL

## 2011-09-09 MED ORDER — MORPHINE SULFATE 2 MG/ML IJ SOLN
1.0000 mg | INTRAMUSCULAR | Status: DC | PRN
  Administered 2011-09-09 (×3): 2 mg via INTRAVENOUS
  Filled 2011-09-09 (×4): qty 1

## 2011-09-09 MED ORDER — DIPHENHYDRAMINE HCL 12.5 MG/5ML PO ELIX
12.5000 mg | ORAL_SOLUTION | Freq: Four times a day (QID) | ORAL | Status: DC | PRN
  Filled 2011-09-09: qty 5

## 2011-09-09 MED ORDER — HEPARIN BOLUS VIA INFUSION
2000.0000 [IU] | Freq: Once | INTRAVENOUS | Status: AC
  Administered 2011-09-09: 2000 [IU] via INTRAVENOUS
  Filled 2011-09-09: qty 2000

## 2011-09-09 MED ORDER — PANTOPRAZOLE SODIUM 40 MG IV SOLR
40.0000 mg | Freq: Every day | INTRAVENOUS | Status: DC
  Administered 2011-09-09 – 2011-09-11 (×3): 40 mg via INTRAVENOUS
  Filled 2011-09-09 (×4): qty 40

## 2011-09-09 MED ORDER — ONDANSETRON HCL 4 MG/2ML IJ SOLN
INTRAMUSCULAR | Status: AC
  Administered 2011-09-09: 4 mg
  Filled 2011-09-09: qty 2

## 2011-09-09 MED ORDER — LEVOTHYROXINE SODIUM 75 MCG PO TABS
75.0000 ug | ORAL_TABLET | Freq: Every day | ORAL | Status: DC
  Administered 2011-09-09 – 2011-09-11 (×3): 75 ug via ORAL
  Filled 2011-09-09 (×5): qty 1

## 2011-09-09 MED ORDER — COLCHICINE 0.6 MG PO TABS
0.6000 mg | ORAL_TABLET | Freq: Every day | ORAL | Status: DC
  Administered 2011-09-09 – 2011-09-11 (×3): 0.6 mg via ORAL
  Filled 2011-09-09 (×4): qty 1

## 2011-09-09 NOTE — Consult Note (Signed)
Admit date: 09/09/2011 Referring Physician  [facility] Primary Physician  Joycelyn Rua, MD Primary Cardiologist:  S. Donnie Aho, MD Reason for Consultation:  Chest pain  ASSESSMENT: 1. Dyspnea on exertion, uncertain cause. This problem is chronic and long-standing  2. Probable biliary colic, diagnosed 09/09/2011  3. History of DVT and prior pulmonary embolism  4. Hypertension  5. Lower chest, interscapular,  jaw, and epigastric discomfort, uncertain cause  PLAN:  1. Agree with 2-D Doppler echocardiogram to assess right heart and to exclude pulmonary hypertension.  2. Further recommendations pending echo results.  3. Lexiscan nuclear study to r/o CAD    HPI: The patient was admitted with nausea and epigastric discomfort. Workup has identified an impacted cystic duct. Cardiology consultation has been obtained due to dyspnea and epigastric discomfort. We are asked to clear the patient for surgery. She has no prior history of heart disease. She had a normal myocardial perfusion study in 2003 and [facility] cardiology by Dr. Deborah Chalk   PMH:   Past Medical History  Diagnosis Date  . Hypertension   . Fever   . Familial Mediterranean fever      PSH:   Past Surgical History  Procedure Date  . Appendectomy   . Abdominal hysterectomy   . Thoracotomy 2003  . Excision of goiter   . Middle ear surgery     x3  . Bladder tack   . Ventral hernia repair     Allergies:  Review of patient's allergies indicates no known allergies. Prior to Admit Meds:   Prescriptions prior to admission  Medication Sig Dispense Refill  . acetaminophen (TYLENOL) 500 MG tablet Take 500 mg by mouth every 6 (six) hours as needed. For pain      . atenolol (TENORMIN) 25 MG tablet Take 25 mg by mouth daily.      . colesevelam (WELCHOL) 625 MG tablet Take 1,250 mg by mouth daily after lunch daily after lunch.      . levothyroxine (SYNTHROID, LEVOTHROID) 75 MCG tablet Take 75 mcg by mouth daily.      Marland Kitchen  warfarin (COUMADIN) 1 MG tablet Take 7-8 mg by mouth daily. Alternating doses. Take 2 tablets (1mg ) along with a 5mg  tablet to make a total of 7mg  one day. The next day take 3 tablets (1mg ) along with 5mg  tablet to make a total dose of 8mg .      . warfarin (COUMADIN) 5 MG tablet Take 7-8 mg by mouth daily. Alternating doses. Take 1 tablet (5mg ) plus two 1mg  tablets to make a total of 7mg  one day. On the next day take 1 tablet (5mg ) and three 1mg  tablets to make a total of 8mg .       Fam HX:    Family History  Problem Relation Age of Onset  . Heart disease Mother     had CHF   Social HX:    History   Social History  . Marital Status: Widowed    Spouse Name: N/A    Number of Children: N/A  . Years of Education: N/A   Occupational History  . Not on file.   Social History Main Topics  . Smoking status: Never Smoker   . Smokeless tobacco: Not on file  . Alcohol Use: No  . Drug Use:   . Sexually Active:    Other Topics Concern  . Not on file   Social History Narrative  . No narrative on file     Review of Systems: Multiple complaints including  decreased hearing, fibromyalgia, and arthritis.  Physical Exam: Blood pressure 137/79, pulse 92, temperature 97.4 F (36.3 C), temperature source Oral, resp. rate 22, height 5\' 10"  (1.778 m), weight 118.888 kg (262 lb 1.6 oz), SpO2 92.00%. Weight change:   Pale.  Chest clear Cardiac without murmur or rub. Rhythm is irregularly irregular. Abdomen is tender. Bowel sounds diminished. Neuro demonstrates decreased hearing. Labs:   Lab Results  Component Value Date   WBC 9.8 09/09/2011   HGB 14.5 09/09/2011   HCT 43.3 09/09/2011   MCV 93.7 09/09/2011   PLT 149* 09/09/2011    Lab 09/09/11 0354  NA 139  K 3.9  CL 103  CO2 23  BUN 21  CREATININE 0.71  CALCIUM 9.5  PROT 6.7  BILITOT 0.5  ALKPHOS 59  ALT 12  AST 24  GLUCOSE 159*   No results found for this basename: PTT   Lab Results  Component Value Date   INR 1.65* 09/09/2011        Radiology:  Dg Chest 2 View  09/09/2011  *RADIOLOGY REPORT*  Clinical Data: Shortness of breath, nausea, vomiting.  CHEST - 2 VIEW  Comparison: Report from study 06/27/2003  Findings: Mild cardiomegaly.  No confluent airspace opacities, effusions or edema.  Degenerative changes in the thoracic spine.  IMPRESSION: Mild cardiomegaly.  No active disease.  Original Report Authenticated By: Cyndie Chime, M.D.   US Abdomen Complete  09/09/2011  *RADIOLOGY REPORT*  Clinical Data:  Abdominal pain, gallstones  COMPLETE ABDOMINAL ULTRASOUND  Comparison:  CT scan same day  Findings:  Gallbladder:  Again noted distention of the gallbladder.  Gallstone in the gallbladder neck region again noted measures 1.1 cm.  No thickening of gallbladder wall.  There is positive sonographic Murphy's sign.  Clinical correlation is necessary to exclude early cholecystitis.  Common bile duct:  Measures 4.8 mm in diameter within normal limits.  Liver:  No focal lesion identified.  Within normal limits in parenchymal echogenicity.  IVC:  Appears normal.  Pancreas:  Limited visualization due to abundant bowel gas.  Spleen:  Measures 9.1 cm in length.  Normal echogenicity.  Right Kidney:  Measures 11.5 cm in length.  No mass, hydronephrosis or diagnostic renal calculus  Left Kidney:  Measures 10.7 cm in length.  No mass, hydronephrosis or diagnostic renal calculi  Abdominal aorta:  No aneurysm identified. Measures up to 2.4 cm in diameter.  Limited visualization due to abundant bowel gas.  IMPRESSION:  1.  Again noted distended gallbladder.  Gallstones and gallbladder neck region measures 1.1 cm.  No thickening of gallbladder wall. There is positive sonographic Murphy's sign.  Clinical correlation is necessary to exclude early cholecystitis. 2.  Normal CBD. 3.  No hydronephrosis or diagnostic renal calculus.  Original Report Authenticated By: Natasha Mead, M.D.   Ct Abdomen Pelvis W Contrast  09/09/2011  *RADIOLOGY REPORT*  Clinical Data:  Abdominal pain and distension.  CT ABDOMEN AND PELVIS WITH CONTRAST  Technique:  Multidetector CT imaging of the abdomen and pelvis was performed following the standard protocol during bolus administration of intravenous contrast.  Contrast:  100 ml Omnipaque 300  Comparison: None.  Findings: Fibrosis or atelectasis in the lung bases.  The liver, spleen, pancreas, adrenal glands, and retroperitoneal lymph nodes are unremarkable.  Stone in the gallbladder neck with moderate gallbladder distension.  No pericholecystic inflammatory change.  Parenchymal and parapelvic cysts in the kidneys.  No solid renal mass or hydronephrosis.  Small accessory spleen.  The proximal stomach  is distended but the pyloric region appears to be decompressed with suggestion of wall thickening.  This could be due to normal peristalsis but does appear persistent early and delayed images.  Gastric mass, stricture, or inflammatory process are not excluded.  Consider endoscopy if the patient has symptoms referable to the stomach.  Small bowel are decompressed.  Stool filled colon without distension.  Prominent visceral adipose tissue.  No free air or free fluid in the abdomen.  Surgical clips along the midline abdominal wall.  Pelvis:  The uterus and appendix are surgically absent.  No abnormal adnexal mass lesions.  No significant pelvic lymphadenopathy.  No free or loculated pelvic fluid collections. The bladder wall is not thickened.  No inflammatory changes in the sigmoid colon despite scattered diverticula.  Degenerative changes in the lumbar spine.  IMPRESSION: Stone in the gallbladder neck with moderate gallbladder distension. No wall thickening or inflammatory infiltration.  Decompression of the distal stomach with suggestion of wall thickening.  Changes may represent normal peristalsis versus inflammatory or neoplastic process. Consider endoscopy.  Original Report Authenticated By: Marlon Pel, M.D.   CXR:  RADIOLOGY REPORT*    Clinical Data: Shortness of breath, nausea, vomiting.  CHEST - 2 VIEW  Comparison: Report from study 06/27/2003  Findings: Mild cardiomegaly. No confluent airspace opacities,  effusions or edema. Degenerative changes in the thoracic spine.  IMPRESSION:  Mild cardiomegaly. No active disease.  Original Report Authenticated By: Cyndie Chime, M.D.  EKG:  Normal sinus rhythm. Nonspecific T wave abnormality.    Lesleigh Noe 09/09/2011 3:12 PM

## 2011-09-09 NOTE — H&P (Signed)
Debbie Richards 22-Mar-1931  [id number].   Primary Care MD: Dr. Lanier Ensign  Requesting MD: Dr. 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  The patient states that last night she developed epigastric pain that radiated to her chest and her back, between her shoulder blades.  She then developed nausea and vomiting.  Her pain persisted and states that it remained epigastric but could also feel it in her RUQ.  Due to persistent pain, she presented to the [facility].  She had a work up which revealed no evidence of acute cholecystitis, but she does have evidence of a stone lodged in the neck of her gallbladder.  All of her labs are normal, but her pain persists.  We have been asked to evaluate secondary to this finding.  She does admit to some chest pain related to this episode, but otherwise no other chest pain.  She does admit to shortness of breath with exertion most of the time.  She states that just walking from the garage down her driveway makes her short of breath.  She sleeps in a hospital bed at night with the [facility] at 45 degrees or so.  She states this is related to her reflux and not secondary to her SOB.  Review of Systems: Please see HPI, otherwise all other systems are essentially negative, except she does complain of leg pain with palpation (her daughter states she always has this), she also admits to chronic anticoagulation secondary to her DVTs and PEs.  + hard of hearing  Family History  Problem Relation Age of Onset  . Heart disease Mother     had CHF    Past Medical History  Diagnosis Date  . Hypertension   . Fever   . Familial Mediterranean fever   Fibromyalgia Shortness of breath on exertion Goiter, s/p removal Chronic anticoagulation H/o DVTs and PEs  Past Surgical History  Procedure Date  . Appendectomy   . Abdominal hysterectomy   .  Thoracotomy 2003  . Excision of goiter   . Middle ear surgery     x3  . Bladder tack   . Ventral hernia repair     Social History:  reports that she has never smoked. She does not have any smokeless tobacco history on file. She reports that she does not drink alcohol. Her drug history not on file.  Allergies: No Known Allergies  Medications Prior to Admission  Medication Dose Route Frequency Provider Last Rate Last Dose  . 0.9 %  sodium chloride infusion   Intravenous Once Dione Booze, MD 125 mL/hr at 09/09/11 0415    . HYDROmorphone (DILAUDID) injection 1 mg  1 mg Intravenous Once Dione Booze, MD   1 mg at 09/09/11 0415  . HYDROmorphone (DILAUDID) injection 1 mg  1 mg Intravenous Once Dione Booze, MD   1 mg at 09/09/11 0735  . HYDROmorphone (DILAUDID) injection 1 mg  1 mg Intravenous Once Toy Baker, MD   1 mg at 09/09/11 1001  . iohexol (OMNIPAQUE) 300 MG/ML solution 100 mL  100 mL Intravenous Once PRN Medication Radiologist, MD   100 mL at 09/09/11 0541  . ondansetron (ZOFRAN) 4 MG/2ML injection        4 mg at 09/09/11 0519  . ondansetron (ZOFRAN) 4 MG/2ML injection        4 mg at 09/09/11 1007  .  ondansetron (ZOFRAN) injection 4 mg  4 mg Intravenous Once Dione Booze, MD   4 mg at 09/09/11 0415  . pantoprazole (PROTONIX) injection 40 mg  40 mg Intravenous QHS Letha Cape, PA      . DISCONTD: acetaminophen (TYLENOL) suppository 650 mg  650 mg Rectal Q6H PRN Letha Cape, PA      . DISCONTD: acetaminophen (TYLENOL) tablet 650 mg  650 mg Oral Q6H PRN Letha Cape, PA      . DISCONTD: dextrose 5 % and 0.45 % NaCl with KCl 20 mEq/L infusion   Intravenous Continuous Letha Cape, PA      . DISCONTD: diphenhydrAMINE (BENADRYL) 12.5 MG/5ML elixir 12.5 mg  12.5 mg Oral Q6H PRN Letha Cape, PA      . DISCONTD: diphenhydrAMINE (BENADRYL) injection 12.5 mg  12.5 mg Intravenous Q6H PRN Letha Cape, PA      . DISCONTD: morphine 2 MG/ML injection 1-4 mg  1-4 mg Intravenous  Q2H PRN Letha Cape, PA      . DISCONTD: ondansetron (ZOFRAN) injection 4 mg  4 mg Intravenous Q6H PRN Letha Cape, PA       Medications Prior to Admission  Medication Sig Dispense Refill  . atenolol (TENORMIN) 25 MG tablet Take 25 mg by mouth daily.      . colesevelam (WELCHOL) 625 MG tablet Take 1,250 mg by mouth daily after lunch daily after lunch.      . levothyroxine (SYNTHROID, LEVOTHROID) 75 MCG tablet Take 75 mcg by mouth daily.      Marland Kitchen warfarin (COUMADIN) 1 MG tablet Take 7-8 mg by mouth daily. Alternating doses. Take 2 tablets (1mg ) along with a 5mg  tablet to make a total of 7mg  one day. The next day take 3 tablets (1mg ) along with 5mg  tablet to make a total dose of 8mg .        Blood pressure 125/39, pulse 84, temperature 97.8 F (36.6 C), temperature source Oral, resp. rate 20, SpO2 100.00%. Physical Exam: General: pleasant, obese white female who is laying in bed in NAD HEENT: head is normocephalic, atraumatic.  Sclera are noninjected.  PERRL.  Ears and nose without any masses or lesions.  Mouth is pink and moist Heart: regular, rate, and rhythm.  Normal s1,s2. No obvious murmurs, gallops, or rubs noted.  Palpable radial and pedal pulses bilaterally Lungs: CTAB, no wheezes, rhonchi, or rales noted.  Respiratory effort nonlabored, she does have a nasal cannula in place with 2 L of O2  Abd: soft, tender in epigastrum and RUQ.  No murphy's sign noted. ND, +BS, no masses, hernias, or organomegaly.  She does have a paramedian and lower midline scars noted from her prior abdominal operations. MS: all 4 extremities are symmetrical with no cyanosis, clubbing.  She does have some edema noted, right leg seems chronically more edematous than left (suspect secondary to DVT).  Some chronic venous stasis changes noted to LEs as well. Skin: warm and dry with no masses, lesions, or rashes Psych: A&Ox3 with an appropriate affect. She is hard of hearing    Results for orders placed during  the hospital encounter of 09/09/11 (from the past 48 hour(s))  CBC     Status: Abnormal   Collection Time   09/09/11  3:54 AM      Component Value Range Comment   WBC 9.8  4.0 - 10.5 (K/uL)    RBC 4.62  3.87 - 5.11 (MIL/uL)    Hemoglobin 14.5  12.0 - 15.0 (g/dL)    HCT 11.9  14.7 - 82.9 (%)    MCV 93.7  78.0 - 100.0 (fL)    MCH 31.4  26.0 - 34.0 (pg)    MCHC 33.5  30.0 - 36.0 (g/dL)    RDW 56.2  13.0 - 86.5 (%)    Platelets 149 (*) 150 - 400 (K/uL)   DIFFERENTIAL     Status: Abnormal   Collection Time   09/09/11  3:54 AM      Component Value Range Comment   Neutrophils Relative 84 (*) 43 - 77 (%)    Neutro Abs 8.2 (*) 1.7 - 7.7 (K/uL)    Lymphocytes Relative 12  12 - 46 (%)    Lymphs Abs 1.2  0.7 - 4.0 (K/uL)    Monocytes Relative 4  3 - 12 (%)    Monocytes Absolute 0.4  0.1 - 1.0 (K/uL)    Eosinophils Relative 0  0 - 5 (%)    Eosinophils Absolute 0.0  0.0 - 0.7 (K/uL)    Basophils Relative 0  0 - 1 (%)    Basophils Absolute 0.0  0.0 - 0.1 (K/uL)   COMPREHENSIVE METABOLIC PANEL     Status: Abnormal   Collection Time   09/09/11  3:54 AM      Component Value Range Comment   Sodium 139  135 - 145 (mEq/L)    Potassium 3.9  3.5 - 5.1 (mEq/L)    Chloride 103  96 - 112 (mEq/L)    CO2 23  19 - 32 (mEq/L)    Glucose, Bld 159 (*) 70 - 99 (mg/dL)    BUN 21  6 - 23 (mg/dL)    Creatinine, Ser 7.84  0.50 - 1.10 (mg/dL)    Calcium 9.5  8.4 - 10.5 (mg/dL)    Total Protein 6.7  6.0 - 8.3 (g/dL)    Albumin 3.9  3.5 - 5.2 (g/dL)    AST 24  0 - 37 (U/L)    ALT 12  0 - 35 (U/L)    Alkaline Phosphatase 59  39 - 117 (U/L)    Total Bilirubin 0.5  0.3 - 1.2 (mg/dL)    GFR calc non Af Amer 80 (*) >90 (mL/min)    GFR calc Af Amer >90  >90 (mL/min)   LIPASE, BLOOD     Status: Normal   Collection Time   09/09/11  3:54 AM      Component Value Range Comment   Lipase 35  11 - 59 (U/L)   PROTIME-INR     Status: Abnormal   Collection Time   09/09/11  3:54 AM      Component Value Range Comment    Prothrombin Time 19.8 (*) 11.6 - 15.2 (seconds)    INR 1.65 (*) 0.00 - 1.49    URINALYSIS, ROUTINE W REFLEX MICROSCOPIC     Status: Abnormal   Collection Time   09/09/11  5:58 AM      Component Value Range Comment   Color, Urine YELLOW  YELLOW     APPearance CLEAR  CLEAR     Specific Gravity, Urine 1.030  1.005 - 1.030     pH 5.0  5.0 - 8.0     Glucose, UA NEGATIVE  NEGATIVE (mg/dL)    Hgb urine dipstick MODERATE (*) NEGATIVE     Bilirubin Urine NEGATIVE  NEGATIVE     Ketones, ur NEGATIVE  NEGATIVE (mg/dL)    Protein, ur NEGATIVE  NEGATIVE (  mg/dL)    Urobilinogen, UA 0.2  0.0 - 1.0 (mg/dL)    Nitrite NEGATIVE  NEGATIVE     Leukocytes, UA SMALL (*) NEGATIVE    URINE MICROSCOPIC-ADD ON     Status: Abnormal   Collection Time   09/09/11  5:58 AM      Component Value Range Comment   Squamous Epithelial / LPF FEW (*) RARE     WBC, UA 3-6  <3 (WBC/hpf)    RBC / HPF 3-6  <3 (RBC/hpf)    Bacteria, UA FEW (*) RARE     Casts HYALINE CASTS (*) NEGATIVE     Urine-Other MUCOUS PRESENT      US Abdomen Complete  09/09/2011  *RADIOLOGY REPORT*  Clinical Data:  Abdominal pain, gallstones  COMPLETE ABDOMINAL ULTRASOUND  Comparison:  CT scan same day  Findings:  Gallbladder:  Again noted distention of the gallbladder.  Gallstone in the gallbladder neck region again noted measures 1.1 cm.  No thickening of gallbladder wall.  There is positive sonographic Murphy's sign.  Clinical correlation is necessary to exclude early cholecystitis.  Common bile duct:  Measures 4.8 mm in diameter within normal limits.  Liver:  No focal lesion identified.  Within normal limits in parenchymal echogenicity.  IVC:  Appears normal.  Pancreas:  Limited visualization due to abundant bowel gas.  Spleen:  Measures 9.1 cm in length.  Normal echogenicity.  Right Kidney:  Measures 11.5 cm in length.  No mass, hydronephrosis or diagnostic renal calculus  Left Kidney:  Measures 10.7 cm in length.  No mass, hydronephrosis or diagnostic renal  calculi  Abdominal aorta:  No aneurysm identified. Measures up to 2.4 cm in diameter.  Limited visualization due to abundant bowel gas.  IMPRESSION:  1.  Again noted distended gallbladder.  Gallstones and gallbladder neck region measures 1.1 cm.  No thickening of gallbladder wall. There is positive sonographic Murphy's sign.  Clinical correlation is necessary to exclude early cholecystitis. 2.  Normal CBD. 3.  No hydronephrosis or diagnostic renal calculus.  Original Report Authenticated By: Natasha Mead, M.D.   Ct Abdomen Pelvis W Contrast  09/09/2011  *RADIOLOGY REPORT*  Clinical Data: Abdominal pain and distension.  CT ABDOMEN AND PELVIS WITH CONTRAST  Technique:  Multidetector CT imaging of the abdomen and pelvis was performed following the standard protocol during bolus administration of intravenous contrast.  Contrast:  100 ml Omnipaque 300  Comparison: None.  Findings: Fibrosis or atelectasis in the lung bases.  The liver, spleen, pancreas, adrenal glands, and retroperitoneal lymph nodes are unremarkable.  Stone in the gallbladder neck with moderate gallbladder distension.  No pericholecystic inflammatory change.  Parenchymal and parapelvic cysts in the kidneys.  No solid renal mass or hydronephrosis.  Small accessory spleen.  The proximal stomach is distended but the pyloric region appears to be decompressed with suggestion of wall thickening.  This could be due to normal peristalsis but does appear persistent early and delayed images.  Gastric mass, stricture, or inflammatory process are not excluded.  Consider endoscopy if the patient has symptoms referable to the stomach.  Small bowel are decompressed.  Stool filled colon without distension.  Prominent visceral adipose tissue.  No free air or free fluid in the abdomen.  Surgical clips along the midline abdominal wall.  Pelvis:  The uterus and appendix are surgically absent.  No abnormal adnexal mass lesions.  No significant pelvic lymphadenopathy.  No free  or loculated pelvic fluid collections. The bladder wall is not thickened.  No inflammatory changes in the sigmoid colon despite scattered diverticula.  Degenerative changes in the lumbar spine.  IMPRESSION: Stone in the gallbladder neck with moderate gallbladder distension. No wall thickening or inflammatory infiltration.  Decompression of the distal stomach with suggestion of wall thickening.  Changes may represent normal peristalsis versus inflammatory or neoplastic process. Consider endoscopy.  Original Report Authenticated By: Marlon Pel, M.D.       Assessment/Plan 1. Biliary colic  [Apparent stone impacted in the neck of the GB.  DN] 2. SOB on exertion 3. Chronic anticoagulation 4. H/o DVTs, and PEs  Plan: 1. I have d/w Dr. Ezzard Standing as well as Dr. Jerral Ralph with the hospitalist service.  We will admit the patient for eventual cholecystectomy.    However, in the meantime, as the patient has not had a cardiac evaluation since 2003 and her SOB, we will have the hospitalist medically evaluate her and if needed get a cardiology consult prior to surgical intervention.  He has also recommended she be put on a heparin drip right now while she is subtherapeutic on her coumadin.  We will hold her coumadin and ask pharmacy to consult for heparin drip.  We will stop this about 8 hours prior to surgical intervention once we are able to proceed.    We will put her on clear liquids for now and make NPO p MN in case we can proceed tomorrow.  We appreciate all consultants assistance with this pleasant patient.  I have d/w the patient and her daughter who is present all of this information.  They understand and are willing to proceed.  The patient is understandably nervous about surgery given her history of DVT and PE after surgery.  I did inform her that this is a risk, but we were going to start her on medicine to help reduce that possibility.  OSBORNE,KELLY E 09/09/2011, 11:02 AM  Ovidio Kin, MD,  [facility] Surgery Pager: [phone] Office phone:  [phone]

## 2011-09-09 NOTE — ED Notes (Signed)
Pt from home with complaints of abdominal pain and distention since yesterday. Pt denies any shortness of breath or n/v/d. EMS report re-producable upon palpation, pt has hot flashes in route without diaphoresis.

## 2011-09-09 NOTE — Progress Notes (Signed)
ANTICOAGULATION CONSULT NOTE - Initial Consult  Pharmacy Consult for Heparin Indication: Hx multiple PEs, subtherapeutic INR  No Known Allergies  Patient Measurements:  Weight: 120kg Heparin Dosing Weight: 96kg  Vital Signs: Temp: 97.8 F (36.6 C) (04/02 0613) Temp src: Oral (04/02 0613) BP: 125/39 mmHg (04/02 0800) Pulse Rate: 84  (04/02 0800)  Labs:  Basename 09/09/11 0354  HGB 14.5  HCT 43.3  PLT 149*  APTT --  LABPROT 19.8*  INR 1.65*  HEPARINUNFRC --  CREATININE 0.71  CKTOTAL --  CKMB --  TROPONINI --   CrCl is unknown because there is no height on file for the current visit.  Medical History: Past Medical History  Diagnosis Date  . Hypertension   . Fever   . Familial Mediterranean fever     Medications:  See electronic Med Rec  Assessment: 80yof to start heparin for h/o multiple PEs with INR currently subtherapeutic (1.65). Coumadin is currently on hold. Patient reports home Coumadin regimen is 8mg  alternating with 7mg  doses - last dose was last PM (4/1 - 8mg ). Pt reported weight 120kg (heparin dosing weight 96kg).  - H/H wnl, Plts low normal - Patient reports no bleeding  Goal of Therapy:  Heparin level 0.3-0.7 units/ml   Plan:  1. Heparin IV bolus 2000 units x 1 2. Heparin drip 1350 units/hr (13.5 ml/hr) 3. Check heparin level 8 hours after heparin initiation 4. Daily heparin level and CBC  Cleon Dew [phone] 09/09/2011,11:17 AM

## 2011-09-09 NOTE — ED Provider Notes (Signed)
History     CSN: [id number]  Arrival date & time 09/09/11  0227   First MD Initiated Contact with Patient 09/09/11 0340      Chief Complaint  Patient presents with  . Abdominal Pain    (Consider location/radiation/quality/duration/timing/severity/associated sxs/prior treatment) Patient is a 78 y.o. female presenting with abdominal pain. The history is provided by the patient.  Abdominal Pain The primary symptoms of the illness include abdominal pain.  She had onset at 8:30 PM of a burning pain in her upper abdomen without radiation. Pain got progressively worse and became severe. It did not radiate. There was no associated nausea but she forced herself to vomit which factor for any relief. There's been no diarrhea. She denies fever, chills, sweats. Pain is worse when she lays down and better when she sits up. She rates the pain at 8/10 currently but it has been as severe as 10 out of 10. She has not had pain microscope or.  Past Medical History  Diagnosis Date  . Hypertension   . Fever   . Familial Mediterranean fever     Past Surgical History  Procedure Date  . Appendectomy   . Abdominal hysterectomy     No family history on file.  History  Substance Use Topics  . Smoking status: Not on file  . Smokeless tobacco: Not on file  . Alcohol Use:     OB History    Grav Para Term Preterm Abortions TAB SAB Ect Mult Living                  Review of Systems  Gastrointestinal: Positive for abdominal pain.  All other systems reviewed and are negative.    Allergies  Review of patient's allergies indicates no known allergies.  Home Medications   Current Outpatient Rx  Name Route Sig Dispense Refill  . ACETAMINOPHEN 500 MG PO TABS Oral Take 500 mg by mouth every 6 (six) hours as needed. For pain    . ATENOLOL 25 MG PO TABS Oral Take 25 mg by mouth daily.    . COLESEVELAM HCL 625 MG PO TABS Oral Take 1,250 mg by mouth daily after lunch daily after lunch.    Marland Kitchen  LEVOTHYROXINE SODIUM 75 MCG PO TABS Oral Take 75 mcg by mouth daily.    . WARFARIN SODIUM 1 MG PO TABS Oral Take 7-8 mg by mouth daily. Alternating doses. Take 2 tablets (1mg ) along with a 5mg  tablet to make a total of 7mg  one day. The next day take 3 tablets (1mg ) along with 5mg  tablet to make a total dose of 8mg .    . WARFARIN SODIUM 5 MG PO TABS Oral Take 7-8 mg by mouth daily. Alternating doses. Take 1 tablet (5mg ) plus two 1mg  tablets to make a total of 7mg  one day. On the next day take 1 tablet (5mg ) and three 1mg  tablets to make a total of 8mg .      BP 150/57  Resp 20  SpO2 100%  Physical Exam  Nursing note and vitals reviewed.  78 year old female appears uncomfortable. Vital signs are significant for mild hypertension with blood pressure 150/57. Oxygen saturation is 100% which is normal. Head is normocephalic and atraumatic. PERRLA, EOMI. There is no scleral icterus. Oropharynx is clear. Neck is nontender and supple. Lungs are clear without rales, wheezes, or rhonchi. Heart has regular rate and rhythm without murmur. Abdomen is soft, flat, with moderate tenderness across the upper abdomen without rebound or guarding. Peristalsis is  diminished. There's no CVA tenderness. Extremities have 1+ edema and venous stasis changes are present. Skin is warm and dry with no rash other than changes of venous stasis. Neurologic: Mental status is normal, cranial nerves are intact, there are no focal motor or sensory deficits.  ED Course  Procedures (including critical care time)  Results for orders placed during the hospital encounter of 09/09/11  CBC      Component Value Range   WBC 9.8  4.0 - 10.5 (K/uL)   RBC 4.62  3.87 - 5.11 (MIL/uL)   Hemoglobin 14.5  12.0 - 15.0 (g/dL)   HCT 16.1  09.6 - 04.5 (%)   MCV 93.7  78.0 - 100.0 (fL)   MCH 31.4  26.0 - 34.0 (pg)   MCHC 33.5  30.0 - 36.0 (g/dL)   RDW 40.9  81.1 - 91.4 (%)   Platelets 149 (*) 150 - 400 (K/uL)  DIFFERENTIAL      Component Value  Range   Neutrophils Relative 84 (*) 43 - 77 (%)   Neutro Abs 8.2 (*) 1.7 - 7.7 (K/uL)   Lymphocytes Relative 12  12 - 46 (%)   Lymphs Abs 1.2  0.7 - 4.0 (K/uL)   Monocytes Relative 4  3 - 12 (%)   Monocytes Absolute 0.4  0.1 - 1.0 (K/uL)   Eosinophils Relative 0  0 - 5 (%)   Eosinophils Absolute 0.0  0.0 - 0.7 (K/uL)   Basophils Relative 0  0 - 1 (%)   Basophils Absolute 0.0  0.0 - 0.1 (K/uL)  COMPREHENSIVE METABOLIC PANEL      Component Value Range   Sodium 139  135 - 145 (mEq/L)   Potassium 3.9  3.5 - 5.1 (mEq/L)   Chloride 103  96 - 112 (mEq/L)   CO2 23  19 - 32 (mEq/L)   Glucose, Bld 159 (*) 70 - 99 (mg/dL)   BUN 21  6 - 23 (mg/dL)   Creatinine, Ser 7.82  0.50 - 1.10 (mg/dL)   Calcium 9.5  8.4 - 95.6 (mg/dL)   Total Protein 6.7  6.0 - 8.3 (g/dL)   Albumin 3.9  3.5 - 5.2 (g/dL)   AST 24  0 - 37 (U/L)   ALT 12  0 - 35 (U/L)   Alkaline Phosphatase 59  39 - 117 (U/L)   Total Bilirubin 0.5  0.3 - 1.2 (mg/dL)   GFR calc non Af Amer 80 (*) >90 (mL/min)   GFR calc Af Amer >90  >90 (mL/min)  LIPASE, BLOOD      Component Value Range   Lipase 35  11 - 59 (U/L)  URINALYSIS, ROUTINE W REFLEX MICROSCOPIC      Component Value Range   Color, Urine YELLOW  YELLOW    APPearance CLEAR  CLEAR    Specific Gravity, Urine 1.030  1.005 - 1.030    pH 5.0  5.0 - 8.0    Glucose, UA NEGATIVE  NEGATIVE (mg/dL)   Hgb urine dipstick MODERATE (*) NEGATIVE    Bilirubin Urine NEGATIVE  NEGATIVE    Ketones, ur NEGATIVE  NEGATIVE (mg/dL)   Protein, ur NEGATIVE  NEGATIVE (mg/dL)   Urobilinogen, UA 0.2  0.0 - 1.0 (mg/dL)   Nitrite NEGATIVE  NEGATIVE    Leukocytes, UA SMALL (*) NEGATIVE   PROTIME-INR      Component Value Range   Prothrombin Time 19.8 (*) 11.6 - 15.2 (seconds)   INR 1.65 (*) 0.00 - 1.49   URINE MICROSCOPIC-ADD ON  Component Value Range   Squamous Epithelial / LPF FEW (*) RARE    WBC, UA 3-6  <3 (WBC/hpf)   RBC / HPF 3-6  <3 (RBC/hpf)   Bacteria, UA FEW (*) RARE    Casts  HYALINE CASTS (*) NEGATIVE    Urine-Other MUCOUS PRESENT     Ct Abdomen Pelvis W Contrast  09/09/2011  *RADIOLOGY REPORT*  Clinical Data: Abdominal pain and distension.  CT ABDOMEN AND PELVIS WITH CONTRAST  Technique:  Multidetector CT imaging of the abdomen and pelvis was performed following the standard protocol during bolus administration of intravenous contrast.  Contrast:  100 ml Omnipaque 300  Comparison: None.  Findings: Fibrosis or atelectasis in the lung bases.  The liver, spleen, pancreas, adrenal glands, and retroperitoneal lymph nodes are unremarkable.  Stone in the gallbladder neck with moderate gallbladder distension.  No pericholecystic inflammatory change.  Parenchymal and parapelvic cysts in the kidneys.  No solid renal mass or hydronephrosis.  Small accessory spleen.  The proximal stomach is distended but the pyloric region appears to be decompressed with suggestion of wall thickening.  This could be due to normal peristalsis but does appear persistent early and delayed images.  Gastric mass, stricture, or inflammatory process are not excluded.  Consider endoscopy if the patient has symptoms referable to the stomach.  Small bowel are decompressed.  Stool filled colon without distension.  Prominent visceral adipose tissue.  No free air or free fluid in the abdomen.  Surgical clips along the midline abdominal wall.  Pelvis:  The uterus and appendix are surgically absent.  No abnormal adnexal mass lesions.  No significant pelvic lymphadenopathy.  No free or loculated pelvic fluid collections. The bladder wall is not thickened.  No inflammatory changes in the sigmoid colon despite scattered diverticula.  Degenerative changes in the lumbar spine.  IMPRESSION: Stone in the gallbladder neck with moderate gallbladder distension. No wall thickening or inflammatory infiltration.  Decompression of the distal stomach with suggestion of wall thickening.  Changes may represent normal peristalsis versus  inflammatory or neoplastic process. Consider endoscopy.  Original Report Authenticated By: Marlon Pel, M.D.    ECG shows normal sinus rhythm with a rate of 82, no ectopy. Normal axis. Normal P wave. Normal QRS. Normal intervals. Normal ST and T waves. Impression: normal ECG. When compared with ECG of 07/01/2003, at QT interval has shortened.  She was given hydromorphone and ondansetron with mild relief of pain. She's given an additional dose of hydromorphone. CT shows a stone lodged in the neck of the gallbladder with gallbladder distention but no wall thickening or inflammatory changes. Ultrasound has been ordered to evaluate for possible cholecystitis. Of note, liver enzymes are normal. Consultation is being obtained with Dr. Ezzard Standing of [facility] surgery who agrees to see the patient in the ED.  1. Abdominal pain   2. Cholelithiasis       MDM  Abdominal pain etiology of which is unclear. She will be sent for CT scan. Laboratory workup was initiated and INR will be checked. She will be given hydromorphone and ondansetron for pain and nausea.        Dione Booze, MD 09/09/11 [phone]

## 2011-09-09 NOTE — Progress Notes (Signed)
ANTICOAGULATION CONSULT NOTE - Follow Up Consult  Pharmacy Consult for Heparin Indication: pulmonary embolus (history) with subtherapeutic INR, bridging for possible surgery  No Known Allergies  Patient Measurements: Height: 5\' 10"  (177.8 cm) Weight: 262 lb 1.6 oz (118.888 kg) IBW/kg (Calculated) : 68.5  Heparin Dosing Weight: 96 kg  Vital Signs: Temp: 98.4 F (36.9 C) (04/02 1741) BP: 140/62 mmHg (04/02 1741) Pulse Rate: 74  (04/02 1741)  Labs:  Basename 09/09/11 2019 09/09/11 0354  HGB -- 14.5  HCT -- 43.3  PLT -- 149*  APTT -- --  LABPROT -- 19.8*  INR -- 1.65*  HEPARINUNFRC 0.43 --  CREATININE -- 0.71  CKTOTAL -- --  CKMB -- --  TROPONINI -- --   Estimated Creatinine Clearance: 81.2 ml/min (by C-G formula based on Cr of 0.71).   Medications:  Scheduled:    . sodium chloride   Intravenous Once  . antiseptic oral rinse  15 mL Mouth Rinse q12n4p  . atenolol  25 mg Oral Daily  . chlorhexidine  15 mL Mouth Rinse BID  . colchicine  0.6 mg Oral Daily  . heparin  2,000 Units Intravenous Once  . HYDROmorphone  1 mg Intravenous Once  . HYDROmorphone  1 mg Intravenous Once  .  HYDROmorphone (DILAUDID) injection  1 mg Intravenous Once  . levothyroxine  75 mcg Oral QAC breakfast  . ondansetron      . ondansetron      . ondansetron (ZOFRAN) IV  4 mg Intravenous Once  . pantoprazole (PROTONIX) IV  40 mg Intravenous QHS  . DISCONTD: levothyroxine  75 mcg Oral QAC breakfast  . DISCONTD: pantoprazole (PROTONIX) IV  40 mg Intravenous QHS   Infusions:    . dextrose 5 % and 0.45 % NaCl with KCl 20 mEq/L 100 mL/hr at 09/09/11 1231  . heparin 1,350 Units/hr (09/09/11 1235)  . DISCONTD: dextrose 5 % and 0.45 % NaCl with KCl 20 mEq/L      Assessment: 78 yo F on heparin for history of multiple PEs.  Pt is currently off home Coumadin pending surgical eval for biliary colic.  Heparin level is therapeutic on 1350 units/hr.  Goal of Therapy:  Heparin level 0.3-0.7 units/ml   Plan:  Continue heparin at 1350 units/hr. Follow up with AM heparin level and CBC. Follow up with surgical plans.  [employer], Pharm.D., BCPS Clinical Pharmacist Pager [phone] 09/09/2011 9:02 PM

## 2011-09-09 NOTE — Consult Note (Signed)
Internal Medicine Consult done by Jeoffrey Massed, MD.  PATIENT DETAILS Name: Debbie Richards Age: 78 y.o. Sex: female Date of Birth: [birth year] Admit Date: 09/09/2011 NWG:[id number], Jeannett Senior, MD, MD Requesting MD: Bishop Limbo, MD, On 09/09/2011    REASON FOR CONSULTATION:  Exertional Dyspnea  HPI: Patient is a 78 year old female, Past medical history of chronic Venous Thromboembolism-with h/o PE and DVT in the past,presented today to the ED for abdominal pain.The patient states that last night she developed epigastric pain that radiated to her chest and her back, between her shoulder blades. She then developed nausea and vomiting. Her pain persisted and states that it remained epigastric but could also feel it in her RUQ. Due to persistent pain, she presented to the [facility]. She had a work up which revealed no evidence of acute cholecystitis, but she does have evidence of a stone lodged in the neck of her gallbladder. She does admit to shortness of breath with exertion most of the time and as a result the hospitalist service was consulted. She claims to get short of breath just walking 20 feet, apparently this has been going on for years, no orthopnea or PND, no h/o COPD or chronic smoking. No h/o of prior oxygen use.Claims she has had this evaluated by Dr Donnie Aho a few years back.  ALLERGIES:  No Known Allergies  PAST MEDICAL HISTORY: Past Medical History  Diagnosis Date  . Hypertension   . Fever   . Familial Mediterranean fever     PAST SURGICAL HISTORY: Past Surgical History  Procedure Date  . Appendectomy   . Abdominal hysterectomy   . Thoracotomy 2003  . Excision of goiter   . Middle ear surgery     x3  . Bladder tack   . Ventral hernia repair     MEDICATIONS AT HOME: Prior to Admission medications   Medication Sig Start Date End Date Taking? Authorizing Provider  acetaminophen (TYLENOL) 500 MG tablet Take 500 mg by mouth every 6 (six) hours as needed. For pain   Yes Historical  Provider, MD  atenolol (TENORMIN) 25 MG tablet Take 25 mg by mouth daily.   Yes Historical Provider, MD  colesevelam (WELCHOL) 625 MG tablet Take 1,250 mg by mouth daily after lunch daily after lunch.   Yes Historical Provider, MD  levothyroxine (SYNTHROID, LEVOTHROID) 75 MCG tablet Take 75 mcg by mouth daily.   Yes Historical Provider, MD  warfarin (COUMADIN) 1 MG tablet Take 7-8 mg by mouth daily. Alternating doses. Take 2 tablets (1mg ) along with a 5mg  tablet to make a total of 7mg  one day. The next day take 3 tablets (1mg ) along with 5mg  tablet to make a total dose of 8mg .   Yes Historical Provider, MD  warfarin (COUMADIN) 5 MG tablet Take 7-8 mg by mouth daily. Alternating doses. Take 1 tablet (5mg ) plus two 1mg  tablets to make a total of 7mg  one day. On the next day take 1 tablet (5mg ) and three 1mg  tablets to make a total of 8mg .   Yes Historical Provider, MD    FAMILY HISTORY: Family History  Problem Relation Age of Onset  . Heart disease Mother     had CHF    SOCIAL HISTORY:  reports that she has never smoked. She does not have any smokeless tobacco history on file. She reports that she does not drink alcohol. Her drug history not on file.  REVIEW OF SYSTEMS:  Constitutional:   No  weight loss, night sweats,  Fevers,  chills, fatigue.  HEENT:    No headaches, Difficulty swallowing,Tooth/dental problems,Sore throat,  No sneezing, itching, ear ache, nasal congestion, post nasal drip,   Cardio-vascular: No chest pain,  Orthopnea, PND, swelling in lower extremities, anasarca,  dizziness, palpitations  GI:  No heartburn, indigestion,diarrhea, change in  bowel habits, loss of appetite  Resp: No shortness of breath with exertion or at rest.  No excess mucus, no productive cough, No non-productive cough,  No coughing up of blood.No change in color of mucus.No wheezing.No chest wall deformity  Skin:  no rash or lesions.  GU:  no dysuria, change in color of urine, no urgency or  frequency.  No flank pain.  Musculoskeletal: No joint pain or swelling.  No decreased range of motion.  No back pain.  Psych: No change in mood or affect. No depression or anxiety.  No memory loss.   PHYSICAL EXAM: Blood pressure 137/79, pulse 92, temperature 97.4 F (36.3 C), temperature source Oral, resp. rate 22, height 5\' 10"  (1.778 m), weight 118.888 kg (262 lb 1.6 oz), SpO2 92.00%.  General appearance :Awake, alert, not in any distress. Speech Clear. Not toxic Looking HEENT: Atraumatic and Normocephalic, pupils equally reactive to light and accomodation Neck: supple, no JVD. No cervical lymphadenopathy.  Chest:Good air entry bilaterally, no added sounds  CVS: S1 S2 regular, no murmurs.  Abdomen: Bowel sounds present, Non tender and not distended with no gaurding, rigidity or rebound. Extremities: B/L Lower Ext shows no edema, both legs are warm to touch, with  dorsalis pedis pulses palpable. Neurology: Awake alert, and oriented X 3, CN II-XII intact, Non focal, Deep Tendon Reflex-2+ all over, plantar's downgoing B/L, sensory exam is grossly intact.  Skin:No Rash Wounds:N/A  LABS ON ADMISSION:   Basename 09/09/11 0354  NA 139  K 3.9  CL 103  CO2 23  GLUCOSE 159*  BUN 21  CREATININE 0.71  CALCIUM 9.5  MG --  PHOS --    Basename 09/09/11 0354  AST 24  ALT 12  ALKPHOS 59  BILITOT 0.5  PROT 6.7  ALBUMIN 3.9    Basename 09/09/11 0354  LIPASE 35  AMYLASE --    Basename 09/09/11 0354  WBC 9.8  NEUTROABS 8.2*  HGB 14.5  HCT 43.3  MCV 93.7  PLT 149*   No results found for this basename: CKTOTAL:3,CKMB:3,CKMBINDEX:3,TROPONINI:3 in the last 72 hours No results found for this basename: DDIMER:2 in the last 72 hours No components found with this basename: POCBNP:3   RADIOLOGIC STUDIES ON ADMISSION: Dg Chest 2 View  09/09/2011  *RADIOLOGY REPORT*  Clinical Data: Shortness of breath, nausea, vomiting.  CHEST - 2 VIEW  Comparison: Report from study 06/27/2003   Findings: Mild cardiomegaly.  No confluent airspace opacities, effusions or edema.  Degenerative changes in the thoracic spine.  IMPRESSION: Mild cardiomegaly.  No active disease.  Original Report Authenticated By: Cyndie Chime, M.D.   US Abdomen Complete  09/09/2011  *RADIOLOGY REPORT*  Clinical Data:  Abdominal pain, gallstones  COMPLETE ABDOMINAL ULTRASOUND  Comparison:  CT scan same day  Findings:  Gallbladder:  Again noted distention of the gallbladder.  Gallstone in the gallbladder neck region again noted measures 1.1 cm.  No thickening of gallbladder wall.  There is positive sonographic Murphy's sign.  Clinical correlation is necessary to exclude early cholecystitis.  Common bile duct:  Measures 4.8 mm in diameter within normal limits.  Liver:  No focal lesion identified.  Within normal limits in parenchymal echogenicity.  IVC:  Appears normal.  Pancreas:  Limited visualization due to abundant bowel gas.  Spleen:  Measures 9.1 cm in length.  Normal echogenicity.  Right Kidney:  Measures 11.5 cm in length.  No mass, hydronephrosis or diagnostic renal calculus  Left Kidney:  Measures 10.7 cm in length.  No mass, hydronephrosis or diagnostic renal calculi  Abdominal aorta:  No aneurysm identified. Measures up to 2.4 cm in diameter.  Limited visualization due to abundant bowel gas.  IMPRESSION:  1.  Again noted distended gallbladder.  Gallstones and gallbladder neck region measures 1.1 cm.  No thickening of gallbladder wall. There is positive sonographic Murphy's sign.  Clinical correlation is necessary to exclude early cholecystitis. 2.  Normal CBD. 3.  No hydronephrosis or diagnostic renal calculus.  Original Report Authenticated By: Natasha Mead, M.D.   Ct Abdomen Pelvis W Contrast  09/09/2011  *RADIOLOGY REPORT*  Clinical Data: Abdominal pain and distension.  CT ABDOMEN AND PELVIS WITH CONTRAST  Technique:  Multidetector CT imaging of the abdomen and pelvis was performed following the standard protocol  during bolus administration of intravenous contrast.  Contrast:  100 ml Omnipaque 300  Comparison: None.  Findings: Fibrosis or atelectasis in the lung bases.  The liver, spleen, pancreas, adrenal glands, and retroperitoneal lymph nodes are unremarkable.  Stone in the gallbladder neck with moderate gallbladder distension.  No pericholecystic inflammatory change.  Parenchymal and parapelvic cysts in the kidneys.  No solid renal mass or hydronephrosis.  Small accessory spleen.  The proximal stomach is distended but the pyloric region appears to be decompressed with suggestion of wall thickening.  This could be due to normal peristalsis but does appear persistent early and delayed images.  Gastric mass, stricture, or inflammatory process are not excluded.  Consider endoscopy if the patient has symptoms referable to the stomach.  Small bowel are decompressed.  Stool filled colon without distension.  Prominent visceral adipose tissue.  No free air or free fluid in the abdomen.  Surgical clips along the midline abdominal wall.  Pelvis:  The uterus and appendix are surgically absent.  No abnormal adnexal mass lesions.  No significant pelvic lymphadenopathy.  No free or loculated pelvic fluid collections. The bladder wall is not thickened.  No inflammatory changes in the sigmoid colon despite scattered diverticula.  Degenerative changes in the lumbar spine.  IMPRESSION: Stone in the gallbladder neck with moderate gallbladder distension. No wall thickening or inflammatory infiltration.  Decompression of the distal stomach with suggestion of wall thickening.  Changes may represent normal peristalsis versus inflammatory or neoplastic process. Consider endoscopy.  Original Report Authenticated By: Marlon Pel, M.D.    ASSESSMENT AND PLAN: Present on Admission:  .Biliary colic -will need cholecystectomy, CCS managiing  .Shortness of breath on exertion -CXR normal and no anemia, ? Pul HTN/CHF -apparently patient  has SOB on exertion even on walking just 20 feet.She denies a h/o CHF, claims to have seen Dr Donnie Aho in the past, will try to get recoards and input from Dr Donnie Aho. She has a h/o VTE-both DVT and PE and could have Pulmonary HTN causing the symptoms. In interim will get a 2 D Echo, and await cards input-before proceeding to cholecystectomy.  DVT/PE -subtherapeutic INR -Place on heparin gtt for perioperative period and resume coumadin when able  Thank you for the consult, we will follow the patient with you in the Hospital.  Total time spent 45 minutes.  Jeoffrey Massed 09/09/2011, 1:43 PM

## 2011-09-09 NOTE — Progress Notes (Signed)
UR completed. Genella Rife [facility] 09/09/2011

## 2011-09-10 ENCOUNTER — Inpatient Hospital Stay (HOSPITAL_COMMUNITY): Payer: Medicare Other

## 2011-09-10 LAB — HEPARIN LEVEL (UNFRACTIONATED): Heparin Unfractionated: 0.37 IU/mL (ref 0.30–0.70)

## 2011-09-10 LAB — CBC
HCT: 40.5 % (ref 36.0–46.0)
Hemoglobin: 13.7 g/dL (ref 12.0–15.0)
MCH: 31.8 pg (ref 26.0–34.0)
MCHC: 33.8 g/dL (ref 30.0–36.0)
MCV: 94 fL (ref 78.0–100.0)
Platelets: 124 10*3/uL — ABNORMAL LOW (ref 150–400)
RBC: 4.31 MIL/uL (ref 3.87–5.11)
RDW: 12.7 % (ref 11.5–15.5)
WBC: 11.2 10*3/uL — ABNORMAL HIGH (ref 4.0–10.5)

## 2011-09-10 LAB — COMPREHENSIVE METABOLIC PANEL
ALT: 10 U/L (ref 0–35)
AST: 19 U/L (ref 0–37)
Albumin: 3.3 g/dL — ABNORMAL LOW (ref 3.5–5.2)
Alkaline Phosphatase: 58 U/L (ref 39–117)
BUN: 12 mg/dL (ref 6–23)
CO2: 27 mEq/L (ref 19–32)
Calcium: 8.7 mg/dL (ref 8.4–10.5)
Chloride: 102 mEq/L (ref 96–112)
Creatinine, Ser: 0.71 mg/dL (ref 0.50–1.10)
GFR calc Af Amer: >90 mL/min (ref >90)
GFR calc non Af Amer: 80 mL/min — ABNORMAL LOW (ref >90)
Glucose, Bld: 157 mg/dL — ABNORMAL HIGH (ref 70–99)
Potassium: 4 mEq/L (ref 3.5–5.1)
Sodium: 136 mEq/L (ref 135–145)
Total Bilirubin: 1.1 mg/dL (ref 0.3–1.2)
Total Protein: 6 g/dL (ref 6.0–8.3)

## 2011-09-10 LAB — PROTIME-INR
INR: 2.27 — ABNORMAL HIGH (ref 0.00–1.49)
Prothrombin Time: 25.4 seconds — ABNORMAL HIGH (ref 11.6–15.2)

## 2011-09-10 MED ORDER — REGADENOSON 0.4 MG/5ML IV SOLN
0.4000 mg | Freq: Once | INTRAVENOUS | Status: AC
  Administered 2011-09-10: 0.4 mg via INTRAVENOUS
  Filled 2011-09-10: qty 5

## 2011-09-10 MED ORDER — CIPROFLOXACIN IN D5W 400 MG/200ML IV SOLN
400.0000 mg | Freq: Two times a day (BID) | INTRAVENOUS | Status: DC
  Administered 2011-09-10 – 2011-09-13 (×8): 400 mg via INTRAVENOUS
  Filled 2011-09-10 (×10): qty 200

## 2011-09-10 NOTE — Progress Notes (Signed)
Unfortunately, she will require a 2 day Nuclear study(obesity) to r/o ischemia. Will clear if no evidence of ischemia.

## 2011-09-10 NOTE — Progress Notes (Signed)
Lexiscan infusion completed.  No arrhythmia or significant ST segment changes. Rest images tomorrow.

## 2011-09-10 NOTE — Progress Notes (Signed)
  Echocardiogram 2D Echocardiogram has been performed.  Mercy Moore 09/10/2011, 12:47 PM

## 2011-09-10 NOTE — Progress Notes (Signed)
ANTICOAGULATION CONSULT NOTE - Follow Up Consult  Pharmacy Consult for Heparin Indication: Hx PE, bridging for possible surgery in setting of subtherapeutic INR  No Known Allergies  Patient Measurements: Height: 5\' 10"  (177.8 cm) Weight: 161 lb 1.6 oz (118.888 kg) IBW/kg (Calculated) : 68.5  Heparin Dosing Weight: 96 kg  Vital Signs: Temp: 98.5 F (36.9 C) (04/03 1119) Temp src: Oral (04/03 0554) BP: 155/47 mmHg (04/03 0944) Pulse Rate: 91  (04/03 1119)  Labs:  Basename 09/10/11 0530 09/09/11 2019 09/09/11 0354  HGB 13.7 -- 14.5  HCT 40.5 -- 43.3  PLT 124* -- 149*  APTT -- -- --  LABPROT 25.4* -- 19.8*  INR 2.27* -- 1.65*  HEPARINUNFRC 0.37 0.43 --  CREATININE 0.71 -- 0.71  CKTOTAL -- -- --  CKMB -- -- --  TROPONINI -- -- --   Estimated Creatinine Clearance: 81.2 ml/min (by C-G formula based on Cr of 0.71).   Medications:  Scheduled:     . antiseptic oral rinse  15 mL Mouth Rinse q12n4p  . atenolol  25 mg Oral Daily  . chlorhexidine  15 mL Mouth Rinse BID  . ciprofloxacin  400 mg Intravenous Q12H  . colchicine  0.6 mg Oral Daily  . levothyroxine  75 mcg Oral QAC breakfast  . pantoprazole (PROTONIX) IV  40 mg Intravenous QHS  . regadenoson  0.4 mg Intravenous Once  . DISCONTD: levothyroxine  75 mcg Oral QAC breakfast   Infusions:     . dextrose 5 % and 0.45 % NaCl with KCl 20 mEq/L 100 mL/hr at 09/10/11 0530  . heparin 1,350 Units/hr (09/10/11 0425)    Assessment: 78 yo F on heparin for history of multiple PEs currently off of home warfarin while awaiting surgery for biliary colic/cholelithiasis on Thursday (if cleared by cardiology).  Heparin level this a.m is therapeutic (HL 0.37, goal of 0.3-0.7). Noted plans to hold tomorrow a.m at 0200 if cleared by cardiology to go to surgery on Thursday. INR trended up today despite last warfarin dose on 4/1 (INR 2.27 << 1.65) --discussed with surgery team and it was decided not to hold heparin at this time. Will  re-address if INR continues to rise tomorrow.   Goal of Therapy:  Heparin level 0.3-0.7 units/ml   Plan:  1. Continue heparin at current rate of 1350 units/hr (13.5 ml/hr) 2. Will continue to monitor INR rise, surgery plans, signs/symptoms of bleeding, and will follow up with heparin level in the a.m.   Georgina Pillion, PharmD, BCPS Clinical Pharmacist Pager: [phone] 09/10/2011 1:50 PM

## 2011-09-10 NOTE — Consult Note (Signed)
Followup Consult Note  Debbie Richards NWG:[id number] DOB: 1930/12/29 DOA: 09/09/2011 PCP: Joycelyn Rua, MD, MD  Attending service: General surgery  Brief narrative: 78 year old woman present with a history of epigastric pain. Found to have evidence of stone lodged in neck of gallbladder. Admitted by surgical service for biliary colic. Abdominal pain and distention times one day prior to admission.  Past medical history: Multiple DVT, pulmonary embolism, hypertension, familial Mediterranean fever  Other Consultants:  Cardiology   Interim History: Chart reviewed in detail. Low-grade temperature. Vital signs otherwise stable.  Subjective: No complaints.  Objective: Filed Vitals:   09/10/11 0940 09/10/11 0942 09/10/11 0944 09/10/11 1119  BP: 154/48 157/50 155/47   Pulse: 109 109 104 91  Temp:    98.5 F (36.9 C)  TempSrc:      Resp:      Height:      Weight:      SpO2:    95%    Intake/Output Summary (Last 24 hours) at 09/10/11 1153 Last data filed at 09/10/11 0825  Gross per 24 hour  Intake 1995.33 ml  Output    253 ml  Net 1742.33 ml    Exam:   General:  Appears calm and comfortable.  Cardiovascular: Regular rate and rhythm. No murmur, rub, gallop. No lower extremity edema  Respiratory: Clear to auscultation bilaterally. No wheezes, rales, rhonchi. Normal respiratory effort  Psychiatric: Grossly normal mood and affect. Speech fluent and appropriate  Data Reviewed: Basic Metabolic Panel:  Lab 09/10/11 5784 09/09/11 0354  NA 136 139  K 4.0 3.9  CL 102 103  CO2 27 23  GLUCOSE 157* 159*  BUN 12 21  CREATININE 0.71 0.71  CALCIUM 8.7 9.5  MG -- --  PHOS -- --   Liver Function Tests:  Lab 09/10/11 0530 09/09/11 0354  AST 19 24  ALT 10 12  ALKPHOS 58 59  BILITOT 1.1 0.5  PROT 6.0 6.7  ALBUMIN 3.3* 3.9    Lab 09/09/11 0354  LIPASE 35  AMYLASE --   CBC:  Lab 09/10/11 0530 09/09/11 0354  WBC 11.2* 9.8  NEUTROABS -- 8.2*  HGB 13.7 14.5    HCT 40.5 43.3  MCV 94.0 93.7  PLT 124* 149*   Studies: Dg Chest 2 View  09/09/2011  *RADIOLOGY REPORT*  Clinical Data: Shortness of breath, nausea, vomiting.  CHEST - 2 VIEW  Comparison: Report from study 06/27/2003  Findings: Mild cardiomegaly.  No confluent airspace opacities, effusions or edema.  Degenerative changes in the thoracic spine.  IMPRESSION: Mild cardiomegaly.  No active disease.  Original Report Authenticated By: Cyndie Chime, M.D.   US Abdomen Complete  09/09/2011  *RADIOLOGY REPORT*  Clinical Data:  Abdominal pain, gallstones  COMPLETE ABDOMINAL ULTRASOUND  Comparison:  CT scan same day  Findings:  Gallbladder:  Again noted distention of the gallbladder.  Gallstone in the gallbladder neck region again noted measures 1.1 cm.  No thickening of gallbladder wall.  There is positive sonographic Murphy's sign.  Clinical correlation is necessary to exclude early cholecystitis.  Common bile duct:  Measures 4.8 mm in diameter within normal limits.  Liver:  No focal lesion identified.  Within normal limits in parenchymal echogenicity.  IVC:  Appears normal.  Pancreas:  Limited visualization due to abundant bowel gas.  Spleen:  Measures 9.1 cm in length.  Normal echogenicity.  Right Kidney:  Measures 11.5 cm in length.  No mass, hydronephrosis or diagnostic renal calculus  Left Kidney:  Measures 10.7 cm  in length.  No mass, hydronephrosis or diagnostic renal calculi  Abdominal aorta:  No aneurysm identified. Measures up to 2.4 cm in diameter.  Limited visualization due to abundant bowel gas.  IMPRESSION:  1.  Again noted distended gallbladder.  Gallstones and gallbladder neck region measures 1.1 cm.  No thickening of gallbladder wall. There is positive sonographic Murphy's sign.  Clinical correlation is necessary to exclude early cholecystitis. 2.  Normal CBD. 3.  No hydronephrosis or diagnostic renal calculus.  Original Report Authenticated By: Natasha Mead, M.D.   Ct Abdomen Pelvis W  Contrast  09/09/2011  *RADIOLOGY REPORT*  Clinical Data: Abdominal pain and distension.  CT ABDOMEN AND PELVIS WITH CONTRAST  Technique:  Multidetector CT imaging of the abdomen and pelvis was performed following the standard protocol during bolus administration of intravenous contrast.  Contrast:  100 ml Omnipaque 300  Comparison: None.  Findings: Fibrosis or atelectasis in the lung bases.  The liver, spleen, pancreas, adrenal glands, and retroperitoneal lymph nodes are unremarkable.  Stone in the gallbladder neck with moderate gallbladder distension.  No pericholecystic inflammatory change.  Parenchymal and parapelvic cysts in the kidneys.  No solid renal mass or hydronephrosis.  Small accessory spleen.  The proximal stomach is distended but the pyloric region appears to be decompressed with suggestion of wall thickening.  This could be due to normal peristalsis but does appear persistent early and delayed images.  Gastric mass, stricture, or inflammatory process are not excluded.  Consider endoscopy if the patient has symptoms referable to the stomach.  Small bowel are decompressed.  Stool filled colon without distension.  Prominent visceral adipose tissue.  No free air or free fluid in the abdomen.  Surgical clips along the midline abdominal wall.  Pelvis:  The uterus and appendix are surgically absent.  No abnormal adnexal mass lesions.  No significant pelvic lymphadenopathy.  No free or loculated pelvic fluid collections. The bladder wall is not thickened.  No inflammatory changes in the sigmoid colon despite scattered diverticula.  Degenerative changes in the lumbar spine.  IMPRESSION: Stone in the gallbladder neck with moderate gallbladder distension. No wall thickening or inflammatory infiltration.  Decompression of the distal stomach with suggestion of wall thickening.  Changes may represent normal peristalsis versus inflammatory or neoplastic process. Consider endoscopy.  Original Report Authenticated By:  Marlon Pel, M.D.    Scheduled Meds:   . antiseptic oral rinse  15 mL Mouth Rinse q12n4p  . atenolol  25 mg Oral Daily  . chlorhexidine  15 mL Mouth Rinse BID  . ciprofloxacin  400 mg Intravenous Q12H  . colchicine  0.6 mg Oral Daily  . heparin  2,000 Units Intravenous Once  . levothyroxine  75 mcg Oral QAC breakfast  . pantoprazole (PROTONIX) IV  40 mg Intravenous QHS  . regadenoson  0.4 mg Intravenous Once  . DISCONTD: levothyroxine  75 mcg Oral QAC breakfast   Continuous Infusions:   . dextrose 5 % and 0.45 % NaCl with KCl 20 mEq/L 100 mL/hr at 09/10/11 0530  . heparin 1,350 Units/hr (09/10/11 0425)     Assessment/Plan: 1. Biliary colic: Per general surgery. 2. Dyspnea on exertion/shortness of breath: Followup 2-D echocardiogram. Cardiology evaluation. 3. Chest pain: Per cardiology. Followup rest images tomorrow. 4. Thrombocytopenia: Followup in a.m. 5. History of DVT/PE: Heparin infusion in the perioperative period. Resume Coumadin when able.  Code Status: Full code  Disposition Plan: Per surgery   Brendia Sacks, MD  [facility] Pager [phone] 09/10/2011, 11:53  AM    LOS: 1 day

## 2011-09-10 NOTE — Progress Notes (Signed)
Nutrition Brief Note:  Pt determined to be at nutrition risk via health history screen; pt reported dysphagia.  Discussed dysphagia with pt who described rare difficultly swallowing since enlarged thyroid surgery 10 yrs ago.  Pt denies concern for swallowing or intake.  She reports that last week she attempted to diet for wt loss, however no other recent wt change or change in dietary habits.  Pt reports hunger due to clear liquid diet.  Feels like she has been eating sweets all day- 'juice, jello, soda.'  No nutrition dx or warranted interventions at this time.  RD to continue to screen for nutritional adequacy and change in status.  Please consult if warranted.  Hoyt Koch Pager: [phone]

## 2011-09-10 NOTE — Progress Notes (Signed)
Subjective: Low grade temperature overnight. Patient states her pain is much improved today. No nausea or emesis. No BM or flatus noted. SCDs in place.   Objective: Vital signs in last 24 hours: Temp:  [97.4 F (36.3 C)-100.5 F (38.1 C)] 99.6 F (37.6 C) (04/03 0554) Pulse Rate:  [74-92] 76  (04/03 0554) Resp:  [18-22] 18  (04/03 0554) BP: (99-140)/(39-79) 137/64 mmHg (04/03 0554) SpO2:  [92 %-100 %] 94 % (04/03 0554) Weight:  [262 lb 1.6 oz (118.888 kg)] 262 lb 1.6 oz (118.888 kg) (04/02 1201) Weight change:  Last BM Date: 09/08/11  Intake/Output from previous day: 04/02 0701 - 04/03 0700 In: 1995.3 [P.O.:120; I.V.:1875.3] Out: 3 [Urine:2; Emesis/NG output:1] Intake/Output this shift:    General appearance: alert, cooperative and no distress Head: Normocephalic, without obvious abnormality, atraumatic Resp: clear to auscultation bilaterally Cardio: irregular rhythm at times. no murmur. GI: Soft, ND. TTP in periumbilical and RUQ, LUQ. BS present but diminished.  Extremities: edema R>L nonpitting, trace-1+. Tender at baseline. Skin changes of venous insufficiency. Neurologic: Grossly normal  Lab Results:  Basename 09/10/11 0530 09/09/11 0354  WBC 11.2* 9.8  HGB 13.7 14.5  HCT 40.5 43.3  PLT 124* 149*   BMET  Basename 09/10/11 0530 09/09/11 0354  NA 136 139  K 4.0 3.9  CL 102 103  CO2 27 23  GLUCOSE 157* 159*  BUN 12 21  CREATININE 0.71 0.71  CALCIUM 8.7 9.5    Studies/Results: Dg Chest 2 View  09/09/2011  *RADIOLOGY REPORT*  Clinical Data: Shortness of breath, nausea, vomiting.  CHEST - 2 VIEW  Comparison: Report from study 06/27/2003  Findings: Mild cardiomegaly.  No confluent airspace opacities, effusions or edema.  Degenerative changes in the thoracic spine.  IMPRESSION: Mild cardiomegaly.  No active disease.  Original Report Authenticated By: Cyndie Chime, M.D.   US Abdomen Complete  09/09/2011  *RADIOLOGY REPORT*  Clinical Data:  Abdominal pain,  gallstones  COMPLETE ABDOMINAL ULTRASOUND  Comparison:  CT scan same day  Findings:  Gallbladder:  Again noted distention of the gallbladder.  Gallstone in the gallbladder neck region again noted measures 1.1 cm.  No thickening of gallbladder wall.  There is positive sonographic Murphy's sign.  Clinical correlation is necessary to exclude early cholecystitis.  Common bile duct:  Measures 4.8 mm in diameter within normal limits.  Liver:  No focal lesion identified.  Within normal limits in parenchymal echogenicity.  IVC:  Appears normal.  Pancreas:  Limited visualization due to abundant bowel gas.  Spleen:  Measures 9.1 cm in length.  Normal echogenicity.  Right Kidney:  Measures 11.5 cm in length.  No mass, hydronephrosis or diagnostic renal calculus  Left Kidney:  Measures 10.7 cm in length.  No mass, hydronephrosis or diagnostic renal calculi  Abdominal aorta:  No aneurysm identified. Measures up to 2.4 cm in diameter.  Limited visualization due to abundant bowel gas.  IMPRESSION:  1.  Again noted distended gallbladder.  Gallstones and gallbladder neck region measures 1.1 cm.  No thickening of gallbladder wall. There is positive sonographic Murphy's sign.  Clinical correlation is necessary to exclude early cholecystitis. 2.  Normal CBD. 3.  No hydronephrosis or diagnostic renal calculus.  Original Report Authenticated By: Natasha Mead, M.D.   Ct Abdomen Pelvis W Contrast  09/09/2011  *RADIOLOGY REPORT*  Clinical Data: Abdominal pain and distension.  CT ABDOMEN AND PELVIS WITH CONTRAST  Technique:  Multidetector CT imaging of the abdomen and pelvis was performed following the standard  protocol during bolus administration of intravenous contrast.  Contrast:  100 ml Omnipaque 300  Comparison: None.  Findings: Fibrosis or atelectasis in the lung bases.  The liver, spleen, pancreas, adrenal glands, and retroperitoneal lymph nodes are unremarkable.  Stone in the gallbladder neck with moderate gallbladder distension.  No  pericholecystic inflammatory change.  Parenchymal and parapelvic cysts in the kidneys.  No solid renal mass or hydronephrosis.  Small accessory spleen.  The proximal stomach is distended but the pyloric region appears to be decompressed with suggestion of wall thickening.  This could be due to normal peristalsis but does appear persistent early and delayed images.  Gastric mass, stricture, or inflammatory process are not excluded.  Consider endoscopy if the patient has symptoms referable to the stomach.  Small bowel are decompressed.  Stool filled colon without distension.  Prominent visceral adipose tissue.  No free air or free fluid in the abdomen.  Surgical clips along the midline abdominal wall.  Pelvis:  The uterus and appendix are surgically absent.  No abnormal adnexal mass lesions.  No significant pelvic lymphadenopathy.  No free or loculated pelvic fluid collections. The bladder wall is not thickened.  No inflammatory changes in the sigmoid colon despite scattered diverticula.  Degenerative changes in the lumbar spine.  IMPRESSION: Stone in the gallbladder neck with moderate gallbladder distension. No wall thickening or inflammatory infiltration.  Decompression of the distal stomach with suggestion of wall thickening.  Changes may represent normal peristalsis versus inflammatory or neoplastic process. Consider endoscopy.  Original Report Authenticated By: Marlon Pel, M.D.    Medications:  Scheduled:   . antiseptic oral rinse  15 mL Mouth Rinse q12n4p  . atenolol  25 mg Oral Daily  . chlorhexidine  15 mL Mouth Rinse BID  . colchicine  0.6 mg Oral Daily  . heparin  2,000 Units Intravenous Once  .  HYDROmorphone (DILAUDID) injection  1 mg Intravenous Once  . levothyroxine  75 mcg Oral QAC breakfast  . ondansetron      . pantoprazole (PROTONIX) IV  40 mg Intravenous QHS  . DISCONTD: levothyroxine  75 mcg Oral QAC breakfast  . DISCONTD: pantoprazole (PROTONIX) IV  40 mg Intravenous QHS    ZOX:[id number], acetaminophen, diphenhydrAMINE, diphenhydrAMINE, morphine, ondansetron, DISCONTD: acetaminophen, DISCONTD: acetaminophen, DISCONTD: diphenhydrAMINE, DISCONTD: diphenhydrAMINE, DISCONTD: morphine, DISCONTD: ondansetron  Assessment/Plan: 78 yo female with history PE/DVT presents with biliary colic, cholelithiasis of gallbladder neck.  1. Abdominal pain/cholelithiasis. Increase in WBC, temperature-likely some developing cholecystitis with known 1cm stone at GB neck. Will start ciprofloxacin IV today. Dilaudid prn for pain. Surgical clearance pending cardiac evaluation. Patient is NPO for cardiac stress today, but may have clears after this until midnight NPO.   She actually feels a lot better.  She jokingly said that she is ready to go home.  2. Cardiac. Echo pending. Patient is NPO pending myoview planned by Cards.   Seen by Dr. Mendel Ryder (appreciate his assistance)  Echo results pending.  3. Hx VTE. Recurrent post-operative DVT and PE, possible pulm HTN with chronic SOB. Heparin bridge and SCDs. Plan to DC heparin at 2am for pre-operative preparation in am Thursday, as long as patient has been cleared by cardiology.   4.  Thrombocytopenia - to follow 5.  PT/INR going the wrong way - will recheck tomorrow AM.  May need Vit K to correct if not better.  DN   LOS: 1 day   KONKOL,JILL PGY-2 09/10/2011, 7:53 AM  Ovidio Kin, MD, [facility] Surgery  Pager: [phone] Office phone:  [phone]

## 2011-09-10 NOTE — Progress Notes (Signed)
Clinical Social Worker received referral due to pt "living alone".  CSW went to pt's room.  Pt currently in with medical personnel.  CSW to return to complete assessment when pt is available.  CSW to continue to follow and assist as needed.  Angelia Mould, MSW, [place] [phone]

## 2011-09-11 ENCOUNTER — Other Ambulatory Visit (HOSPITAL_COMMUNITY): Payer: Medicare Other

## 2011-09-11 LAB — DIFFERENTIAL
Basophils Absolute: 0 10*3/uL (ref 0.0–0.1)
Basophils Relative: 0 % (ref 0–1)
Eosinophils Absolute: 0.1 10*3/uL (ref 0.0–0.7)
Eosinophils Relative: 1 % (ref 0–5)
Lymphocytes Relative: 18 % (ref 12–46)
Lymphs Abs: 1.9 10*3/uL (ref 0.7–4.0)
Monocytes Absolute: 0.7 10*3/uL (ref 0.1–1.0)
Monocytes Relative: 7 % (ref 3–12)
Neutro Abs: 7.8 10*3/uL — ABNORMAL HIGH (ref 1.7–7.7)
Neutrophils Relative %: 74 % (ref 43–77)

## 2011-09-11 LAB — PROTIME-INR
INR: 1.66 — ABNORMAL HIGH (ref 0.00–1.49)
Prothrombin Time: 19.9 seconds — ABNORMAL HIGH (ref 11.6–15.2)

## 2011-09-11 LAB — CBC
HCT: 42.2 % (ref 36.0–46.0)
Hemoglobin: 13.9 g/dL (ref 12.0–15.0)
MCH: 31.6 pg (ref 26.0–34.0)
MCHC: 32.9 g/dL (ref 30.0–36.0)
MCV: 95.9 fL (ref 78.0–100.0)
Platelets: 127 10*3/uL — ABNORMAL LOW (ref 150–400)
RBC: 4.4 MIL/uL (ref 3.87–5.11)
RDW: 12.8 % (ref 11.5–15.5)
WBC: 10.5 10*3/uL (ref 4.0–10.5)

## 2011-09-11 LAB — HEPARIN LEVEL (UNFRACTIONATED)
Heparin Unfractionated: <0.1 IU/mL — ABNORMAL LOW (ref 0.30–0.70)
Heparin Unfractionated: 0.18 IU/mL — ABNORMAL LOW (ref 0.30–0.70)

## 2011-09-11 MED ORDER — TECHNETIUM TC 99M TETROFOSMIN IV KIT
30.0000 | PACK | Freq: Once | INTRAVENOUS | Status: AC | PRN
  Administered 2011-09-11: 30 via INTRAVENOUS

## 2011-09-11 MED ORDER — HEPARIN (PORCINE) IN NACL 100-0.45 UNIT/ML-% IJ SOLN
1550.0000 [IU]/h | INTRAMUSCULAR | Status: DC
  Administered 2011-09-11: 1350 [IU]/h via INTRAVENOUS
  Administered 2011-09-11: 1550 [IU]/h via INTRAVENOUS
  Filled 2011-09-11 (×2): qty 250

## 2011-09-11 MED ORDER — HEPARIN BOLUS VIA INFUSION
2000.0000 [IU] | Freq: Once | INTRAVENOUS | Status: AC
  Administered 2011-09-11: 2000 [IU] via INTRAVENOUS
  Filled 2011-09-11: qty 2000

## 2011-09-11 NOTE — Progress Notes (Signed)
Subjective: Pain resolved. No nausea or emesis. No BM or flatus. Patient is hungry for solids, but NPO currently.  Objective: Vital signs in last 24 hours: Temp:  [98 F (36.7 C)-99.8 F (37.7 C)] 98.2 F (36.8 C) (04/04 0635) Pulse Rate:  [64-109] 64  (04/04 0635) Resp:  [16-18] 16  (04/04 0635) BP: (114-157)/(47-56) 129/53 mmHg (04/04 0635) SpO2:  [93 %-100 %] 95 % (04/04 0635) Weight change:  Last BM Date: 09/08/11  Intake/Output from previous day: 04/03 0701 - 04/04 0700 In: 1281 [P.O.:240; I.V.:841; IV Piggyback:200] Out: 700 [Urine:700] Intake/Output this shift:    General appearance: alert, cooperative and no distress Head: Normocephalic, without obvious abnormality, atraumatic Resp: clear to auscultation bilaterally Cardio: irregular rhythm at times. no murmur. GI: Soft, ND. TTP in periumbilical area, LUQ. BS present but diminished.  Extremities: nonpitting edema bilateral LE, trace-1+. Tender at baseline. Skin changes of venous insufficiency. Neurologic: Grossly normal  Lab Results:  Basename 09/11/11 0655 09/10/11 0530  WBC 10.5 11.2*  HGB 13.9 13.7  HCT 42.2 40.5  PLT 127* 124*   BMET  Basename 09/10/11 0530 09/09/11 0354  NA 136 139  K 4.0 3.9  CL 102 103  CO2 27 23  GLUCOSE 157* 159*  BUN 12 21  CREATININE 0.71 0.71  CALCIUM 8.7 9.5   INR/Prothrombin Time Lab Results  Component Value Date   INR 1.66* 09/11/2011   INR 2.27* 09/10/2011   INR 1.65* 09/09/2011     Studies/Results: Dg Chest 2 View  09/09/2011  *RADIOLOGY REPORT*  Clinical Data: Shortness of breath, nausea, vomiting.  CHEST - 2 VIEW  Comparison: Report from study 06/27/2003  Findings: Mild cardiomegaly.  No confluent airspace opacities, effusions or edema.  Degenerative changes in the thoracic spine.  IMPRESSION: Mild cardiomegaly.  No active disease.  Original Report Authenticated By: Cyndie Chime, M.D.   US Abdomen Complete  09/09/2011  *RADIOLOGY REPORT*  Clinical Data:  Abdominal  pain, gallstones  COMPLETE ABDOMINAL ULTRASOUND  Comparison:  CT scan same day  Findings:  Gallbladder:  Again noted distention of the gallbladder.  Gallstone in the gallbladder neck region again noted measures 1.1 cm.  No thickening of gallbladder wall.  There is positive sonographic Murphy's sign.  Clinical correlation is necessary to exclude early cholecystitis.  Common bile duct:  Measures 4.8 mm in diameter within normal limits.  Liver:  No focal lesion identified.  Within normal limits in parenchymal echogenicity.  IVC:  Appears normal.  Pancreas:  Limited visualization due to abundant bowel gas.  Spleen:  Measures 9.1 cm in length.  Normal echogenicity.  Right Kidney:  Measures 11.5 cm in length.  No mass, hydronephrosis or diagnostic renal calculus  Left Kidney:  Measures 10.7 cm in length.  No mass, hydronephrosis or diagnostic renal calculi  Abdominal aorta:  No aneurysm identified. Measures up to 2.4 cm in diameter.  Limited visualization due to abundant bowel gas.  IMPRESSION:  1.  Again noted distended gallbladder.  Gallstones and gallbladder neck region measures 1.1 cm.  No thickening of gallbladder wall. There is positive sonographic Murphy's sign.  Clinical correlation is necessary to exclude early cholecystitis. 2.  Normal CBD. 3.  No hydronephrosis or diagnostic renal calculus.  Original Report Authenticated By: Natasha Mead, M.D.   2D Echo: EF 55-60%, trivial mitral valve prolapse, PAP 33 mmHg  Medications:  Scheduled:    . antiseptic oral rinse  15 mL Mouth Rinse q12n4p  . atenolol  25 mg Oral Daily  .  chlorhexidine  15 mL Mouth Rinse BID  . ciprofloxacin  400 mg Intravenous Q12H  . colchicine  0.6 mg Oral Daily  . levothyroxine  75 mcg Oral QAC breakfast  . pantoprazole (PROTONIX) IV  40 mg Intravenous QHS  . regadenoson  0.4 mg Intravenous Once   AOZ:[id number], acetaminophen, diphenhydrAMINE, diphenhydrAMINE, morphine, ondansetron  Assessment/Plan: 77 yo female with history  PE/DVT presents with biliary colic, cholelithiasis of gallbladder neck.  1. Abdominal pain/cholelithiasis. Mild component of cholecystitis with low grade temp and leukocytosis yesterday, stable and improved on IV ciprofloxacin. Dilaudid prn for pain-minimal. Surgical clearance pending cardiac evaluation-2 day myoview test. Patient is NPO for cardiac stress today, but may have clears after this until midnight NPO.   2. Cardiac. Echo shows normal systolic function, mild increase in PA pressure. Completion of myoview planned for today, appreciate cards following.   Per Dr. Katrinka Blazing - if nuclear scan negative, can proceed with surgery.  DN  3. Hx VTE. Hx of recurrent post-operative DVT and PE. Heparin bridge and SCDs while INR subtherapeutic. Plan to DC heparin at 2am for pre-operative preparation Friday, as long as patient has been cleared by cardiology. F/u daily INR, pharmacy to dose heparin gtt.   PT/INR - 19.9/1.66 - 09/11/2011.  I'd like the INR just a little lower.  Repeat labs in AM.  DN  4.  Thrombocytopenia - to follow, stable today on heparin.  Plts - 127,000 - 4/4/42013  5. HTN. On home dose atenolol, stable.    LOS: 2 days   KONKOL,JILL PGY-2 09/11/2011, 7:31 AM   Ovidio Kin, MD, [facility] Surgery Pager: [phone] Office phone:  [phone]

## 2011-09-11 NOTE — Consult Note (Signed)
Followup Consult Note  Debbie Richards ZOX:[id number] DOB: 1930/08/08 DOA: 09/09/2011 PCP: Joycelyn Rua, MD, MD  Attending service: General surgery  Brief narrative: 78 year old woman present with a history of epigastric pain. Found to have evidence of stone lodged in neck of gallbladder. Admitted by surgical service for biliary colic. Abdominal pain and distention times one day prior to admission.  Past medical history: Multiple DVT, pulmonary embolism, hypertension, familial Mediterranean fever  Other Consultants:  Cardiology  Interim History: Interval documentation reviewed.  Subjective: No complaints.  Objective: Filed Vitals:   09/10/11 1748 09/10/11 2121 09/11/11 0145 09/11/11 0635  BP: 118/48 130/52 135/52 129/53  Pulse: 86 78 79 64  Temp: 99.4 F (37.4 C) 99.8 F (37.7 C) 98 F (36.7 C) 98.2 F (36.8 C)  TempSrc:  Oral Oral Oral  Resp:  18 16 16   Height:      Weight:      SpO2: 93% 96% 100% 95%    Intake/Output Summary (Last 24 hours) at 09/11/11 1325 Last data filed at 09/10/11 1400  Gross per 24 hour  Intake   1041 ml  Output      0 ml  Net   1041 ml    Exam:   General:  Appears calm and comfortable.  Cardiovascular: Regular rate and rhythm. No murmur, rub, gallop. No lower extremity edema  Respiratory: Clear to auscultation bilaterally. No wheezes, rales, rhonchi. Normal respiratory effort  Psychiatric: Grossly normal mood and affect. Speech fluent and appropriate  Data Reviewed: Basic Metabolic Panel:  Lab 09/10/11 8119 09/09/11 0354  NA 136 139  K 4.0 3.9  CL 102 103  CO2 27 23  GLUCOSE 157* 159*  BUN 12 21  CREATININE 0.71 0.71  CALCIUM 8.7 9.5  MG -- --  PHOS -- --   Liver Function Tests:  Lab 09/10/11 0530 09/09/11 0354  AST 19 24  ALT 10 12  ALKPHOS 58 59  BILITOT 1.1 0.5  PROT 6.0 6.7  ALBUMIN 3.3* 3.9    Lab 09/09/11 0354  LIPASE 35  AMYLASE --   CBC:  Lab 09/11/11 0655 09/10/11 0530 09/09/11 0354  WBC 10.5  11.2* 9.8  NEUTROABS 7.8* -- 8.2*  HGB 13.9 13.7 14.5  HCT 42.2 40.5 43.3  MCV 95.9 94.0 93.7  PLT 127* 124* 149*   Studies:  Scheduled Meds:    . antiseptic oral rinse  15 mL Mouth Rinse q12n4p  . atenolol  25 mg Oral Daily  . chlorhexidine  15 mL Mouth Rinse BID  . ciprofloxacin  400 mg Intravenous Q12H  . colchicine  0.6 mg Oral Daily  . heparin  2,000 Units Intravenous Once  . levothyroxine  75 mcg Oral QAC breakfast  . pantoprazole (PROTONIX) IV  40 mg Intravenous QHS   Continuous Infusions:    . dextrose 5 % and 0.45 % NaCl with KCl 20 mEq/L 100 mL/hr at 09/10/11 1719  . heparin 1,350 Units/hr (09/10/11 0425)  . heparin 1,350 Units/hr (09/11/11 0830)     Assessment/Plan: 1. Biliary colic: Per general surgery. 2. Dyspnea on exertion/shortness of breath: 2-D echocardiogram notable for an ejection fraction of 55-60%. Normal wall motion. Deferred to cardiology. 3. Chest pain: Per cardiology. Results of nuclear medicine myocardial perfusion scan pending. Defer interpretation and recommendations to cardiology. 4. Thrombocytopenia: Stable. 5. History of DVT/PE: Heparin infusion in the perioperative period. Resume Coumadin when able.  Medical issues appear to be stable. I will sign off at this time. I remain available throughout  the coming weekend. Please contact me if I can be of further assistance.  Code Status: Full code  Disposition Plan: Per surgery   Brendia Sacks, MD  [facility] Pager [phone] 09/11/2011, 1:25 PM    LOS: 2 days

## 2011-09-11 NOTE — Progress Notes (Signed)
ANTICOAGULATION CONSULT NOTE - Follow Up Consult  Pharmacy Consult for Heparin Indication: Hx PE, bridging for possible surgery in setting of subtherapeutic INR  No Known Allergies  Patient Measurements: Height: 5\' 10"  (177.8 cm) Weight: 262 lb 1.6 oz (118.888 kg) IBW/kg (Calculated) : 68.5  Heparin Dosing Weight: 96 kg  Vital Signs: Temp: 98.2 F (36.8 C) (04/04 0635) Temp src: Oral (04/04 0635) BP: 129/53 mmHg (04/04 0635) Pulse Rate: 64  (04/04 0635)  Labs:  Basename 09/11/11 0655 09/10/11 0530 09/09/11 2019 09/09/11 0354  HGB 13.9 13.7 -- --  HCT 42.2 40.5 -- 43.3  PLT 127* 124* -- 149*  APTT -- -- -- --  LABPROT 19.9* 25.4* -- 19.8*  INR 1.66* 2.27* -- 1.65*  HEPARINUNFRC <0.10* 0.37 0.43 --  CREATININE -- 0.71 -- 0.71  CKTOTAL -- -- -- --  CKMB -- -- -- --  TROPONINI -- -- -- --   Estimated Creatinine Clearance: 81.2 ml/min (by C-G formula based on Cr of 0.71).   Medications:  Scheduled:     . antiseptic oral rinse  15 mL Mouth Rinse q12n4p  . atenolol  25 mg Oral Daily  . chlorhexidine  15 mL Mouth Rinse BID  . ciprofloxacin  400 mg Intravenous Q12H  . colchicine  0.6 mg Oral Daily  . levothyroxine  75 mcg Oral QAC breakfast  . pantoprazole (PROTONIX) IV  40 mg Intravenous QHS  . regadenoson  0.4 mg Intravenous Once   Infusions:     . dextrose 5 % and 0.45 % NaCl with KCl 20 mEq/L 100 mL/hr at 09/10/11 1719  . heparin 1,350 Units/hr (09/10/11 0425)    Assessment: 78 yo F on heparin for history of multiple PEs currently off of home warfarin while awaiting surgery for biliary colic/cholelithiasis on Friday (if cleared by cardiology). Heparin was turned off at 0200 this a.m for potential surgery today, however this has been postponed. Heparin is now to be resumed for bridging with INR <2 and potential surgery plans for Friday. INR today is 1.66 so will only give 1/2 bolus of heparin and resume at rate previously known to be therapeutic. Hgb/Hct/Plt  stable. No s/sx of bleeding noted. Noted plans to discontinue drip at 0200 -- will f/u plans post op  Goal of Therapy:  Heparin level 0.3-0.7 units/ml   Plan:  1. Heparin bolus of 2000 units x 1 2. Initiate heparin drip at previous rate of 1350 units/hr (13.5 ml/hr) 3. Noted plans to d/c heparin drip at 0200 -- will f/u heparin/warfarin plans post-op 4. Will continue to monitor for any signs/symptoms of bleeding and will follow up with heparin level in 8 hours   Georgina Pillion, PharmD, BCPS Clinical Pharmacist Pager: [phone] 09/11/2011 8:12 AM

## 2011-09-11 NOTE — Progress Notes (Signed)
If the nuclear study today is negative for ischemia or is low risk, she would be cleared for surgery. We will followup as needed.

## 2011-09-11 NOTE — Progress Notes (Signed)
ANTICOAGULATION CONSULT NOTE - Follow Up Consult  Pharmacy Consult for Heparin Indication: Hx PE, bridging for possible surgery in setting of subtherapeutic INR  No Known Allergies  Patient Measurements: Height: 5\' 10"  (177.8 cm) Weight: 262 lb 1.6 oz (118.888 kg) IBW/kg (Calculated) : 68.5  Heparin Dosing Weight: 96 kg  Vital Signs: Temp: 98.2 F (36.8 C) (04/04 1811) Temp src: Oral (04/04 1811) BP: 117/42 mmHg (04/04 1811) Pulse Rate: 73  (04/04 1811)  Labs:  Basename 09/11/11 1737 09/11/11 0655 09/10/11 0530 09/09/11 0354  HGB -- 13.9 13.7 --  HCT -- 42.2 40.5 43.3  PLT -- 127* 124* 149*  APTT -- -- -- --  LABPROT -- 19.9* 25.4* 19.8*  INR -- 1.66* 2.27* 1.65*  HEPARINUNFRC 0.18* <0.10* 0.37 --  CREATININE -- -- 0.71 0.71  CKTOTAL -- -- -- --  CKMB -- -- -- --  TROPONINI -- -- -- --   Estimated Creatinine Clearance: 81.2 ml/min (by C-G formula based on Cr of 0.71).   Medications:  Scheduled:     . antiseptic oral rinse  15 mL Mouth Rinse q12n4p  . atenolol  25 mg Oral Daily  . chlorhexidine  15 mL Mouth Rinse BID  . ciprofloxacin  400 mg Intravenous Q12H  . colchicine  0.6 mg Oral Daily  . heparin  2,000 Units Intravenous Once  . levothyroxine  75 mcg Oral QAC breakfast  . pantoprazole (PROTONIX) IV  40 mg Intravenous QHS   Infusions:     . dextrose 5 % and 0.45 % NaCl with KCl 20 mEq/L 100 mL/hr at 09/11/11 1527  . heparin 1,350 Units/hr (09/10/11 0425)  . heparin 1,350 Units/hr (09/11/11 0830)    Assessment: 78 yo F on heparin for history of multiple PEs currently off of home warfarin while awaiting surgery for biliary colic/cholelithiasis on Friday (if cleared by cardiology)  Current heparin level only 0.18 (less than goal).  Goal of Therapy:  Heparin level 0.3-0.7 units/ml   Plan:  1. Heparin bolus of 2000 units x 1 2. Increase heparin 1550 units/hr. 3. Noted plans to d/c heparin drip at 0200 -- will f/u heparin/warfarin plans  post-op  Datra Clary S. Merilynn Finland, PharmD, BCPS Clinical Staff Pharmacist Pager [phone] 09/11/11, [phone]

## 2011-09-11 NOTE — Progress Notes (Signed)
Clinical Social Work Department BRIEF PSYCHOSOCIAL ASSESSMENT 09/11/2011  Patient:  Debbie Richards, Debbie Richards     Account Number:  [id number]     Admit date:  09/09/2011  Clinical Social Worker:  Margaree Mackintosh  Date/Time:  09/11/2011 02:05 PM  Referred by:  Physician  Date Referred:  09/10/2011 Referred for  Advanced Directives   Other Referral:   Interview type:  Patient Other interview type:   with dtr present    PSYCHOSOCIAL DATA Living Status:  ALONE Admitted from facility:   Level of care:   Primary support name:  [phone] Primary support relationship to patient:  CHILD, ADULT Degree of support available:   Adequate    CURRENT CONCERNS Current Concerns  Other - See comment   Other Concerns:   Advanced Directives    SOCIAL WORK ASSESSMENT / PLAN Clinical Social Worker met with pt and pt's dtr at bedside. CSW introduced self and explained role.  CSW provided emotional support.  CSW confirmed pt's interests in [place]. CSW noted that pt and dtr were familiar with HCPOA paperwork and planned to have paperwork notarized outside of the hospital.  CSW provided HCPOA paperwork to dtr and pt.  CSW to sign off at this time, please reconsult if needed.   Assessment/plan status:   Other assessment/ plan:   Information/referral to community resources:    PATIENT'S/FAMILY'S RESPONSE TO PLAN OF CARE:  Pt and dtr were pleasant and appropriately engaged.  Pt and dtr thanked CSW for intervention.        Angelia Mould, MSW, [place] [phone]

## 2011-09-12 ENCOUNTER — Inpatient Hospital Stay (HOSPITAL_COMMUNITY): Payer: Medicare Other

## 2011-09-12 ENCOUNTER — Encounter (HOSPITAL_COMMUNITY): Admission: EM | Disposition: A | Discharge status: Home / Self Care | Payer: Self-pay | Source: Home / Self Care

## 2011-09-12 ENCOUNTER — Encounter (HOSPITAL_COMMUNITY): Payer: Self-pay | Admitting: Anesthesiology

## 2011-09-12 ENCOUNTER — Inpatient Hospital Stay (HOSPITAL_COMMUNITY): Payer: Medicare Other | Admitting: Anesthesiology

## 2011-09-12 HISTORY — PX: CHOLECYSTECTOMY: SHX55

## 2011-09-12 LAB — DIFFERENTIAL
Basophils Absolute: 0 10*3/uL (ref 0.0–0.1)
Basophils Relative: 0 % (ref 0–1)
Eosinophils Absolute: 0.2 10*3/uL (ref 0.0–0.7)
Eosinophils Relative: 2 % (ref 0–5)
Lymphocytes Relative: 16 % (ref 12–46)
Lymphs Abs: 1.3 10*3/uL (ref 0.7–4.0)
Monocytes Absolute: 0.6 10*3/uL (ref 0.1–1.0)
Monocytes Relative: 8 % (ref 3–12)
Neutro Abs: 5.9 10*3/uL (ref 1.7–7.7)
Neutrophils Relative %: 74 % (ref 43–77)

## 2011-09-12 LAB — CBC
HCT: 38.8 % (ref 36.0–46.0)
Hemoglobin: 13 g/dL (ref 12.0–15.0)
MCH: 31.9 pg (ref 26.0–34.0)
MCHC: 33.5 g/dL (ref 30.0–36.0)
MCV: 95.1 fL (ref 78.0–100.0)
Platelets: 121 10*3/uL — ABNORMAL LOW (ref 150–400)
RBC: 4.08 MIL/uL (ref 3.87–5.11)
RDW: 12.7 % (ref 11.5–15.5)
WBC: 8 10*3/uL (ref 4.0–10.5)

## 2011-09-12 LAB — SURGICAL PCR SCREEN
MRSA, PCR: NEGATIVE
Staphylococcus aureus: NEGATIVE

## 2011-09-12 LAB — PROTIME-INR
INR: 1.56 — ABNORMAL HIGH (ref 0.00–1.49)
Prothrombin Time: 19 seconds — ABNORMAL HIGH (ref 11.6–15.2)

## 2011-09-12 SURGERY — LAPAROSCOPIC CHOLECYSTECTOMY WITH INTRAOPERATIVE CHOLANGIOGRAM
Anesthesia: General | Site: Abdomen | Wound class: Contaminated

## 2011-09-12 MED ORDER — ONDANSETRON HCL 4 MG PO TABS: 4.0000 mg | ORAL_TABLET | Freq: Four times a day (QID) | ORAL | Status: DC | PRN

## 2011-09-12 MED ORDER — BUPIVACAINE-EPINEPHRINE 0.25% -1:200000 IJ SOLN
INTRAMUSCULAR | Status: DC | PRN
  Administered 2011-09-12: 30 mL

## 2011-09-12 MED ORDER — HYDROCODONE-ACETAMINOPHEN 5-325 MG PO TABS
1.0000 | ORAL_TABLET | ORAL | Status: DC | PRN
  Administered 2011-09-16: 1 via ORAL
  Filled 2011-09-12: qty 1

## 2011-09-12 MED ORDER — ONDANSETRON HCL 4 MG/2ML IJ SOLN
4.0000 mg | Freq: Once | INTRAMUSCULAR | Status: AC | PRN
  Administered 2011-09-12: 4 mg via INTRAVENOUS

## 2011-09-12 MED ORDER — LACTATED RINGERS IV SOLN
INTRAVENOUS | Status: DC | PRN
  Administered 2011-09-12 (×2): via INTRAVENOUS

## 2011-09-12 MED ORDER — SODIUM CHLORIDE 0.9 % IV SOLN
INTRAVENOUS | Status: DC | PRN
  Administered 2011-09-12: 11:00:00

## 2011-09-12 MED ORDER — HYDROMORPHONE HCL PF 1 MG/ML IJ SOLN
0.2500 mg | INTRAMUSCULAR | Status: DC | PRN
  Administered 2011-09-12: 0.5 mg via INTRAVENOUS

## 2011-09-12 MED ORDER — LIDOCAINE HCL (CARDIAC) 20 MG/ML IV SOLN
INTRAVENOUS | Status: DC | PRN
  Administered 2011-09-12: 40 mg via INTRAVENOUS

## 2011-09-12 MED ORDER — GLYCOPYRROLATE 0.2 MG/ML IJ SOLN
INTRAMUSCULAR | Status: DC | PRN
  Administered 2011-09-12: .4 mg via INTRAVENOUS

## 2011-09-12 MED ORDER — ROCURONIUM BROMIDE 100 MG/10ML IV SOLN
INTRAVENOUS | Status: DC | PRN
  Administered 2011-09-12: 10 mg via INTRAVENOUS
  Administered 2011-09-12: 5 mg via INTRAVENOUS
  Administered 2011-09-12: 35 mg via INTRAVENOUS

## 2011-09-12 MED ORDER — ACETAMINOPHEN 325 MG PO TABS: 650.0000 mg | ORAL_TABLET | ORAL | Status: DC | PRN

## 2011-09-12 MED ORDER — PROPOFOL 10 MG/ML IV EMUL
INTRAVENOUS | Status: DC | PRN
  Administered 2011-09-12: 200 mg via INTRAVENOUS

## 2011-09-12 MED ORDER — LABETALOL HCL 5 MG/ML IV SOLN
INTRAVENOUS | Status: DC | PRN
  Administered 2011-09-12: 5 mg via INTRAVENOUS

## 2011-09-12 MED ORDER — ONDANSETRON HCL 4 MG/2ML IJ SOLN: 4.0000 mg | Freq: Four times a day (QID) | INTRAMUSCULAR | Status: DC | PRN

## 2011-09-12 MED ORDER — ONDANSETRON HCL 4 MG/2ML IJ SOLN
INTRAMUSCULAR | Status: AC
  Filled 2011-09-12: qty 2

## 2011-09-12 MED ORDER — HEPARIN SODIUM (PORCINE) 5000 UNIT/ML IJ SOLN
5000.0000 [IU] | Freq: Three times a day (TID) | INTRAMUSCULAR | Status: DC
  Administered 2011-09-12 – 2011-09-17 (×15): 5000 [IU] via SUBCUTANEOUS
  Filled 2011-09-12 (×20): qty 1

## 2011-09-12 MED ORDER — MORPHINE SULFATE 2 MG/ML IJ SOLN
1.0000 mg | INTRAMUSCULAR | Status: DC | PRN
  Administered 2011-09-12: 2 mg via INTRAVENOUS
  Filled 2011-09-12: qty 1

## 2011-09-12 MED ORDER — CIPROFLOXACIN IN D5W 400 MG/200ML IV SOLN
INTRAVENOUS | Status: AC
  Filled 2011-09-12: qty 200

## 2011-09-12 MED ORDER — SODIUM CHLORIDE 0.9 % IR SOLN
Status: DC | PRN
  Administered 2011-09-12 (×2): 1000 mL

## 2011-09-12 MED ORDER — HEMOSTATIC AGENTS (NO CHARGE) OPTIME
TOPICAL | Status: DC | PRN
  Administered 2011-09-12: 1 via TOPICAL

## 2011-09-12 MED ORDER — LACTATED RINGERS IV SOLN
INTRAVENOUS | Status: DC
  Administered 2011-09-12: 09:00:00 via INTRAVENOUS

## 2011-09-12 MED ORDER — 0.9 % SODIUM CHLORIDE (POUR BTL) OPTIME
TOPICAL | Status: DC | PRN
  Administered 2011-09-12: 1000 mL

## 2011-09-12 MED ORDER — FENTANYL CITRATE 0.05 MG/ML IJ SOLN
INTRAMUSCULAR | Status: DC | PRN
  Administered 2011-09-12 (×4): 50 ug via INTRAVENOUS
  Administered 2011-09-12: 100 ug via INTRAVENOUS

## 2011-09-12 MED ORDER — ONDANSETRON HCL 4 MG/2ML IJ SOLN
INTRAMUSCULAR | Status: DC | PRN
  Administered 2011-09-12: 4 mg via INTRAVENOUS

## 2011-09-12 MED ORDER — LIDOCAINE HCL 4 % MT SOLN
OROMUCOSAL | Status: DC | PRN
  Administered 2011-09-12: 4 mL via TOPICAL

## 2011-09-12 MED ORDER — NEOSTIGMINE METHYLSULFATE 1 MG/ML IJ SOLN
INTRAMUSCULAR | Status: DC | PRN
  Administered 2011-09-12: 3 mg via INTRAVENOUS

## 2011-09-12 SURGICAL SUPPLY — 56 items
ADH SKN CLS APL DERMABOND .7 (GAUZE/BANDAGES/DRESSINGS) ×1
APPLIER CLIP ROT 10 11.4 M/L (STAPLE) ×2
APR CLP MED LRG 11.4X10 (STAPLE) ×1
BAG SPEC RTRVL LRG 6X4 10 (ENDOMECHANICALS) ×1
BLADE SURG ROTATE 9660 (MISCELLANEOUS) IMPLANT
CANISTER SUCTION 2500CC (MISCELLANEOUS) ×2 IMPLANT
CHLORAPREP W/TINT 26ML (MISCELLANEOUS) ×2 IMPLANT
CHOLANGIOGRAM CATH TAUT (CATHETERS) ×2 IMPLANT
CLIP APPLIE ROT 10 11.4 M/L (STAPLE) ×1 IMPLANT
CLOTH BEACON ORANGE TIMEOUT ST (SAFETY) ×2 IMPLANT
COVER MAYO STAND STRL (DRAPES) ×2 IMPLANT
COVER SURGICAL LIGHT HANDLE (MISCELLANEOUS) ×2 IMPLANT
DECANTER SPIKE VIAL GLASS SM (MISCELLANEOUS) ×2 IMPLANT
DERMABOND ADVANCED (GAUZE/BANDAGES/DRESSINGS) ×1
DERMABOND ADVANCED .7 DNX12 (GAUZE/BANDAGES/DRESSINGS) ×1 IMPLANT
DRAPE C-ARM 42X72 X-RAY (DRAPES) ×2 IMPLANT
DRAPE UTILITY 15X26 W/TAPE STR (DRAPE) ×4 IMPLANT
ELECT REM PT RETURN 9FT ADLT (ELECTROSURGICAL) ×2
ELECTRODE REM PT RTRN 9FT ADLT (ELECTROSURGICAL) ×1 IMPLANT
ENDOLOOP SUT PDS II  0 18 (SUTURE) ×2
ENDOLOOP SUT PDS II 0 18 (SUTURE) IMPLANT
FILTER SMOKE EVAC LAPAROSHD (FILTER) ×2 IMPLANT
GLOVE BIO SURGEON STRL SZ7.5 (GLOVE) ×1 IMPLANT
GLOVE BIOGEL M STRL SZ7.5 (GLOVE) ×1 IMPLANT
GLOVE BIOGEL PI IND STRL 7.0 (GLOVE) IMPLANT
GLOVE BIOGEL PI IND STRL 7.5 (GLOVE) IMPLANT
GLOVE BIOGEL PI IND STRL 8 (GLOVE) IMPLANT
GLOVE BIOGEL PI INDICATOR 7.0 (GLOVE) ×1
GLOVE BIOGEL PI INDICATOR 7.5 (GLOVE) ×1
GLOVE BIOGEL PI INDICATOR 8 (GLOVE) ×1
GLOVE SURG SIGNA 7.5 PF LTX (GLOVE) ×2 IMPLANT
GLOVE SURG SS PI 6.5 STRL IVOR (GLOVE) ×1 IMPLANT
GOWN STRL NON-REIN LRG LVL3 (GOWN DISPOSABLE) ×5 IMPLANT
GOWN STRL REIN XL XLG (GOWN DISPOSABLE) ×3 IMPLANT
HEMOSTAT SURGICEL 2X14 (HEMOSTASIS) ×1 IMPLANT
IV CATH 14GX2 1/4 (CATHETERS) ×2 IMPLANT
KIT BASIN OR (CUSTOM PROCEDURE TRAY) ×2 IMPLANT
KIT ROOM TURNOVER OR (KITS) ×2 IMPLANT
NS IRRIG 1000ML POUR BTL (IV SOLUTION) ×2 IMPLANT
PAD ARMBOARD 7.5X6 YLW CONV (MISCELLANEOUS) ×4 IMPLANT
POUCH SPECIMEN RETRIEVAL 10MM (ENDOMECHANICALS) ×2 IMPLANT
SCALPEL HARMONIC ACE (MISCELLANEOUS) IMPLANT
SCISSORS LAP 5X35 DISP (ENDOMECHANICALS) IMPLANT
SET IRRIG TUBING LAPAROSCOPIC (IRRIGATION / IRRIGATOR) ×2 IMPLANT
SLEEVE Z-THREAD 5X100MM (TROCAR) ×2 IMPLANT
SPECIMEN JAR SMALL (MISCELLANEOUS) ×2 IMPLANT
STOPCOCK 4 WAY LG BORE MALE ST (IV SETS) ×2 IMPLANT
SUT VIC AB 5-0 PS2 18 (SUTURE) ×3 IMPLANT
TOWEL OR 17X24 6PK STRL BLUE (TOWEL DISPOSABLE) ×2 IMPLANT
TOWEL OR 17X26 10 PK STRL BLUE (TOWEL DISPOSABLE) ×2 IMPLANT
TRAY LAPAROSCOPIC (CUSTOM PROCEDURE TRAY) ×2 IMPLANT
TROCAR XCEL BLUNT TIP 100MML (ENDOMECHANICALS) ×2 IMPLANT
TROCAR Z-THREAD FIOS 11X100 BL (TROCAR) ×2 IMPLANT
TROCAR Z-THREAD FIOS 5X100MM (TROCAR) ×2 IMPLANT
TUBING EXTENTION W/L.L. (IV SETS) ×2 IMPLANT
WATER STERILE IRR 1000ML POUR (IV SOLUTION) IMPLANT

## 2011-09-12 NOTE — Progress Notes (Signed)
Patient received from PACU alert and orient,s/p lap chole, v/s stable, will continue to monitor

## 2011-09-12 NOTE — Progress Notes (Signed)
Subjective: Pain resolved. No nausea or emesis. Low grade temperature overnight. Patient is NPO for surgery this am.  Objective: Vital signs in last 24 hours: Temp:  [98.2 F (36.8 C)-100.2 F (37.9 C)] 98.6 F (37 C) (04/05 0756) Pulse Rate:  [70-88] 88  (04/05 0756) Resp:  [16-18] 18  (04/05 0756) BP: (117-146)/(42-67) 146/67 mmHg (04/05 0756) SpO2:  [98 %-100 %] 100 % (04/05 0756) Weight change:  Last BM Date: 09/11/11  Intake/Output from previous day: 04/04 0701 - 04/05 0700 In: 800 [I.V.:800] Out: -  Intake/Output this shift:    General appearance: alert, cooperative and no distress Head: Normocephalic, without obvious abnormality, atraumatic Resp: clear to auscultation bilaterally Cardio: irregular rhythm at times. no murmur. GI: Soft, ND. TTP in periumbilical area, LUQ. BS present but diminished.  Extremities: nonpitting edema bilateral LE, trace-1+. Tender at baseline. Skin changes of venous insufficiency. Neurologic: Grossly normal  Lab Results:  Basename 09/12/11 0545 09/11/11 0655  WBC 8.0 10.5  HGB 13.0 13.9  HCT 38.8 42.2  PLT 121* 127*   BMET  Basename 09/10/11 0530  NA 136  K 4.0  CL 102  CO2 27  GLUCOSE 157*  BUN 12  CREATININE 0.71  CALCIUM 8.7   INR/Prothrombin Time Lab Results  Component Value Date   INR 1.56* 09/12/2011   INR 1.66* 09/11/2011   INR 2.27* 09/10/2011     Studies/Results: Nm Myocar Multi W/spect W/wall Motion / Ef  09/11/2011  *RADIOLOGY REPORT*  Clinical Data:  Chest pain.  Preop.  MYOCARDIAL IMAGING WITH SPECT (REST AND PHARMACOLOGIC-STRESS - 2 DAY PROTOCOL) GATED LEFT VENTRICULAR WALL MOTION STUDY LEFT VENTRICULAR EJECTION FRACTION  Technique:  Standard myocardial SPECT imaging was performed after intravenous injection of 30 mCi Tc-14m tetrofosmin at rest.  On a different day, intravenous infusion of  Lexiscan was performed under supervision of the Cardiology staff.  At peak effect of the drug, 30 mCi Tc-19m tetrofosmin the  was injected intravenously and standard myocardial SPECT imaging was performed.  Quantitative gated imaging was also performed to evaluate left ventricular wall motion and estimate left ventricular ejection fraction.  Comparison:  None  Findings: SPECT imaging demonstrates no reversible or irreversible defects to suggest ischemia or infarction.  Quantitative gated analysis shows normal wall motion.  The resting left ventricular ejection fraction is 76% with end- diastolic volume of 77 ml and end-systolic volume of 18 ml.  IMPRESSION: No evidence of ischemia or infarction.  Ejection fraction 76%.  Original Report Authenticated By: Cyndie Chime, M.D.   2D Echo: EF 55-60%, trivial mitral valve prolapse, PAP 33 mmHg  Myoview negative for ischemic changes  Medications:  Scheduled:    . antiseptic oral rinse  15 mL Mouth Rinse q12n4p  . atenolol  25 mg Oral Daily  . chlorhexidine  15 mL Mouth Rinse BID  . ciprofloxacin  400 mg Intravenous Q12H  . colchicine  0.6 mg Oral Daily  . heparin  2,000 Units Intravenous Once  . heparin  2,000 Units Intravenous Once  . levothyroxine  75 mcg Oral QAC breakfast  . pantoprazole (PROTONIX) IV  40 mg Intravenous QHS   ZOX:[id number], acetaminophen, diphenhydrAMINE, diphenhydrAMINE, morphine, ondansetron, technetium tetrofosmin, technetium tetrofosmin  Assessment/Plan: 78 yo female with history PE/DVT presents with biliary colic, cholelithiasis of gallbladder neck.  1. Abdominal pain/cholelithiasis. Mild component of cholecystitis with low grade temp and leukocytosis yesterday, improved on IV ciprofloxacin. Dilaudid prn for pain-minimal. Surgical clearance for laparoscopic cholecystectomy today.   On Cipro.  2.  Cardiac. No evidence of cardiac dysfunction or inducible ischemia, appreciate cards clearance for surgery.    3. Hx VTE. Hx of recurrent post-operative DVT and PE. Heparin bridge and SCDs preoperatively while INR subtherapeutic. Heparin stopped  at 2am for pre-operative preparation. INR still elevated mildly, will restart heparin/coumadin post-operatively pending operative course/bleeding propensity.   4.  Thrombocytopenia - to follow, stable today on heparin.  Plts - 121,000 - 4/5/42013  5. HTN. On home dose atenolol, stable.    LOS: 3 days   KONKOL,JILL PGY-2 09/12/2011, 7:58 AM  Patient cleared by cardiology.  Her heparin has been held for 8 hours.  She is ready for surgery.  To OR for lap chole.  The patient has two daughters here today in the waiting area.  DN  Ovidio Kin, MD, [facility] Surgery Pager: [phone] Office phone:  [phone]

## 2011-09-12 NOTE — Progress Notes (Addendum)
Patient transferred to 5000 unit, report given to receiving nurse, pt. was accompanied by son, comfortable and no complaints

## 2011-09-12 NOTE — Progress Notes (Signed)
Patient to OR

## 2011-09-12 NOTE — Preoperative (Signed)
Beta Blockers   Reason not to administer Beta Blockers:Not Applicable 

## 2011-09-12 NOTE — Op Note (Signed)
Debbie Richards, GOEL NO.:  [id number]  MEDICAL RECORD NO.:  [id number]  LOCATION:  5022                         FACILITY:  [facility]  PHYSICIAN:  Sandria Bales. Ezzard Standing, M.D.  DATE OF BIRTH:  Feb 01, 1931  DATE OF PROCEDURE:  12 September 2011                              OPERATIVE REPORT   PREOPERATIVE DIAGNOSIS:  Cholecystitis cholelithiasis.  POSTOPERATIVE DIAGNOSIS:  Acute hemorrhagic cholecystitis with empyema of the gallbladder.  PROCEDURE:  Laparoscopic cholecystectomy with intraoperative cholangiogram. Enterolysis of adhesions for 15 minutes.  SURGEON:  Sandria Bales. Ezzard Standing, MD  FIRST ASSIST:  Mary Sella. Andrey Campanile, MD, FACS.  Letha Cape, PA.  ANESTHESIA:  General endotracheal.  ESTIMATED BLOOD LOSS:  Minimal.  ANESTHESIA:  Local anesthetic with 30 mL of 0.25% Marcaine.  INDICATION FOR PROCEDURE:  Debbie Richards is a 78 year old white female who is a patient Dr. Joycelyn Rua, who was admitted on September 09, 2011, with biliary colic/cholecystitis.  She had 2 issues, which had to be addressed.  She was on chronic anticoagulation for DVT, so she switch over on heparin IV and she has some issues from cardiac and was seen by Dr. Verdis Prime and cleared from that standpoint.  Her heparin was stopped 8 hours prior to surgery.  She now comes to the operating room for a laparoscopic cholecystectomy with intraoperative cholangiogram.  The potential complications of procedure were explained to the patient.  The potential complications include, but are not limited to, bleeding, infection, common bile duct injury, and the possibility of open surgery.  She has also had at least 2 prior lower abdominal incisions for an appendix and for hysterectomy and had a hernia repair. It was unclear of what kind of adhesions we were run into and doing her surgery, and this was discussed with her.  OPERATIVE NOTE:  The patient was taken to the room 17, underwent a general endotracheal anesthesia,  supervised Dr. Diamantina Monks.  A time-out was held and surgical checklist run.  She was on Cipro as an antibiotic.  Her abdomen was prepped with ChloraPrep and sterilely draped.  I went through an infraumbilical incision and got into the abdominal cavity.  I did encounter at least 1 piece of prior wire suture, which I removed, but I was able to get into the abdominal cavity.  She did have adhesions along the midline and up to the falciform ligament.  So, I put an additional port in the left upper quadrant to help take down these adhesions.  I probably spent about 15 minutes doing enterolysis.  I was able to create a space to better visualize the gallbladder.  The gallbladder was acutely inflamed and hemorrhagic appearing.  It had omentum and duodenum stuck, which I peeled off bluntly.  I then identified the cystic duct-gallbladder junction and started intraoperative cholangiogram.  A clip was placed on the gallbladder side of the cystic duct.  I used a cutoff Taut catheter through a 14-gauge Jelco into the abdominal cavity and this was inserted into the cystic duct.  Cholangiogram, using about 50 mL of Hypaque solution, showed free flow of contrast down the cystic duct, down the common bile duct into the duodenum.  I  put the patient in a mild Trendelenburg and this had filled up the hepatic radicals.  This was felt to be a normal intraoperative cholangiogram with no filling defect.  The Taut catheter was then removed.  The cystic duct triply endoclipped and divided.  The cystic artery was triply endoclipped and divided.  The gallbladder then bluntly and sharply dissected from the gallbladder bed.  I did have to decompress the gallbladder.  She had both white bile and pus in her gallbladder.  She says she has an empyema of the gallbladder.  After complete division of the gallbladder from the gallbladder bed, I placed the gallbladder in the EndoCatch bag.  I re-looked at  the gallbladder bed, which had some oozing and I controlled this with Bovie electrocautery and then placed a Surgicel in the gallbladder bed.  The gallbladder was then delivered through the umbilicus and sent to Pathology.  The abdomen was irrigated with 1 L of saline.  The triangle of Calot was again visualized and gallbladder bed was visualized, there was no bleeding or bile leak.  I will keep the patient on antibiotics for 5-7 days postop, but she is already in the hospital.  She was transferred to recovery room in good condition.  Sponge, needle counts were correct at the end of the case.   Sandria Bales. Ezzard Standing, M.D., FACS   DHN/MEDQ  D:  09/12/2011  T:  09/12/2011  Job:  [id number]  cc:   Joycelyn Rua, M.D. Lyn Records, M.D.

## 2011-09-12 NOTE — Progress Notes (Signed)
Utilization review completed. Magic Mohler Diane4/10/2011  

## 2011-09-12 NOTE — Progress Notes (Signed)
Patient Name: Debbie Richards Date of Encounter: 09/12/2011    SUBJECTIVE:Not interviewed  TELEMETRY:  NSR: Filed Vitals:   09/11/11 0635 09/11/11 1811 09/11/11 2126 09/12/11 0517  BP: 129/53 117/42 117/44 121/49  Pulse: 64 73 74 70  Temp: 98.2 F (36.8 C) 98.2 F (36.8 C) 100.2 F (37.9 C) 98.7 F (37.1 C)  TempSrc: Oral Oral Oral   Resp: 16 17 16 17   Height:      Weight:      SpO2: 95% 98% 98% 98%    Intake/Output Summary (Last 24 hours) at 09/12/11 0659 Last data filed at 09/11/11 2200  Gross per 24 hour  Intake    800 ml  Output      0 ml  Net    800 ml    LABS: Basic Metabolic Panel:  Basename 09/10/11 0530  NA 136  K 4.0  CL 102  CO2 27  GLUCOSE 157*  BUN 12  CREATININE 0.71  CALCIUM 8.7  MG --  PHOS --   CBC:  Basename 09/12/11 0545 09/11/11 0655  WBC 8.0 10.5  NEUTROABS 5.9 7.8*  HGB 13.0 13.9  HCT 38.8 42.2  MCV 95.1 95.9  PLT 121* 127*   Radiology/Studies:  Nuclear perfusion study: IMPRESSION:  No evidence of ischemia or infarction.  Ejection fraction 76%.  Original Report Authenticated By: Cyndie Chime, M.D.   Physical Exam: Blood pressure 121/49, pulse 70, temperature 98.7 F (37.1 C), temperature source Oral, resp. rate 17, height 5\' 10"  (1.778 m), weight 118.888 kg (262 lb 1.6 oz), SpO2 98.00%. Weight change:    Not examined  ASSESSMENT:  1. Pre-op evaluation from CV perspective is complete and there is no evidence of ischemia by nuclear perfusion   Plan:  1. Cleared for general anesthesia and surgery today.  Selinda Eon 09/12/2011, 6:59 AM

## 2011-09-12 NOTE — Transfer of Care (Signed)
Immediate Anesthesia Transfer of Care Note  Patient: Debbie Richards  Procedure(s) Performed: Procedure(s) (LRB): LAPAROSCOPIC CHOLECYSTECTOMY WITH INTRAOPERATIVE CHOLANGIOGRAM (N/A)  Patient Location: PACU  Anesthesia Type: General  Level of Consciousness: awake and alert   Airway & Oxygen Therapy: Patient Spontanous Breathing and Patient connected to face mask oxygen  Post-op Assessment: Report given to PACU RN and Post -op Vital signs reviewed and stable  Post vital signs: Reviewed and stable  Complications: No apparent anesthesia complications

## 2011-09-12 NOTE — Anesthesia Preprocedure Evaluation (Addendum)
Anesthesia Evaluation  Patient identified by MRN, date of birth, ID band Patient awake    Reviewed: Allergy & Precautions, H&P , NPO status , Patient's Chart, lab work & pertinent test results, reviewed documented beta blocker date and time   History of Anesthesia Complications (+) AWARENESS UNDER ANESTHESIA  Airway Mallampati: I TM Distance: >3 FB Neck ROM: full    Dental   Pulmonary shortness of breath,          Cardiovascular hypertension, Pt. on home beta blockers Rhythm:regular Rate:Normal     Neuro/Psych  Headaches,  Neuromuscular disease    GI/Hepatic GERD-  Controlled,  Endo/Other  Hypothyroidism   Renal/GU      Musculoskeletal  (+) Fibromyalgia -  Abdominal   Peds  Hematology   Anesthesia Other Findings   Reproductive/Obstetrics                          Anesthesia Physical Anesthesia Plan  ASA: II  Anesthesia Plan: General   Post-op Pain Management:    Induction: Intravenous  Airway Management Planned: Oral ETT  Additional Equipment:   Intra-op Plan:   Post-operative Plan: Extubation in OR  Informed Consent: I have reviewed the patients History and Physical, chart, labs and discussed the procedure including the risks, benefits and alternatives for the proposed anesthesia with the patient or authorized representative who has indicated his/her understanding and acceptance.   Dental advisory given  Plan Discussed with: CRNA, Anesthesiologist and Surgeon  Anesthesia Plan Comments:         Anesthesia Quick Evaluation

## 2011-09-12 NOTE — Brief Op Note (Addendum)
09/09/2011 - 09/12/2011  11:25 AM  PATIENT:  Debbie Richards, 78 y.o., female, MRN: [id number]  PREOP DIAGNOSIS:  Cholecystitis/cholelithias  POSTOP DIAGNOSIS:   Hemorrhagic acute cholecystitis with empyema of gall bladder.  PROCEDURE:   Procedure(s): LAPAROSCOPIC CHOLECYSTECTOMY WITH INTRAOPERATIVE CHOLANGIOGRAM, 15 minutes with enterolysis  SURGEON:   Ovidio Kin, M.D.  ASSISTANTAndrey Campanile, MD  ANESTHESIA:   general  Rivka Barbara, MD - Anesthesiologist Kristopher Key, CRNA - CRNA  General  EBL:  minimal  ml  BLOOD ADMINISTERED: none  DRAINS: none   LOCAL MEDICATIONS USED:   30 cc 1/4% marcaine  SPECIMEN:   Gall bladder  COUNTS CORRECT:  YES  INDICATIONS FOR PROCEDURE:  Debbie Richards is a 78 y.o. (DOB: 07-01-1930) white female whose primary care physician is Joycelyn Rua, MD, MD and comes for cholecystectomy and cholangiogram.  She had been hospitalized waiting for cardiac clearance.   The indications and risks of the surgery were explained to the patient.  The risks include, but are not limited to, infection, bleeding, and nerve injury.  Note dictated to:   #[id number]

## 2011-09-12 NOTE — Progress Notes (Signed)
Patient was still on mild pain 30 mins. after giving Morphine, refused to have another pain meds, claimed that it will slowly subside, repositioned and was made comfortable, son at bedside

## 2011-09-13 MED ORDER — WARFARIN SODIUM 10 MG PO TABS
10.0000 mg | ORAL_TABLET | Freq: Once | ORAL | Status: AC
  Administered 2011-09-13: 10 mg via ORAL
  Filled 2011-09-13: qty 1

## 2011-09-13 MED ORDER — WARFARIN - PHARMACIST DOSING INPATIENT
Freq: Every day | Status: DC
  Administered 2011-09-16: 18:00:00

## 2011-09-13 MED ORDER — LEVOTHYROXINE SODIUM 75 MCG PO TABS
75.0000 ug | ORAL_TABLET | Freq: Every day | ORAL | Status: DC
  Administered 2011-09-13 – 2011-09-18 (×6): 75 ug via ORAL
  Filled 2011-09-13 (×6): qty 1

## 2011-09-13 MED ORDER — ATENOLOL 25 MG PO TABS
25.0000 mg | ORAL_TABLET | Freq: Every day | ORAL | Status: DC
  Administered 2011-09-13 – 2011-09-18 (×6): 25 mg via ORAL
  Filled 2011-09-13 (×6): qty 1

## 2011-09-13 MED ORDER — COLCHICINE 0.6 MG PO TABS
0.6000 mg | ORAL_TABLET | Freq: Every day | ORAL | Status: DC
  Administered 2011-09-13 – 2011-09-18 (×6): 0.6 mg via ORAL
  Filled 2011-09-13 (×6): qty 1

## 2011-09-13 NOTE — Progress Notes (Signed)
SUBJECTIVE:  Postop day 1 from cholecystectomy doing well  OBJECTIVE:   Vitals:   Filed Vitals:   09/12/11 1300 09/12/11 1322 09/12/11 2136 09/13/11 0528  BP:  160/100 158/47 139/58  Pulse: 80 83 87 69  Temp:  98.1 F (36.7 C) 97.9 F (36.6 C) 98.6 F (37 C)  TempSrc:      Resp: 18 18 18 20   Height:      Weight:      SpO2: 96% 98% 97% 99%   I&O's:   Intake/Output Summary (Last 24 hours) at 09/13/11 1104 Last data filed at 09/13/11 0519  Gross per 24 hour  Intake 3331.67 ml  Output    150 ml  Net 3181.67 ml   TELEMETRY: Reviewed telemetry pt in NSR:     PHYSICAL EXAM General: Well developed, well nourished, in no acute distress Head: Eyes PERRLA, No xanthomas.   Normal cephalic and atramatic  Lungs:   Clear bilaterally to auscultation and percussion. Heart:   HRRR S1 S2 Pulses are 2+ & equal. Abdomen: Bowel sounds are positive, abdomen soft and non-tender without masses  Extremities:   No clubbing, cyanosis or edema.  DP +1 Neuro: Alert and oriented X 3. Psych:  Good affect, responds appropriately   LABS: CBC:  Basename 09/12/11 0545 09/11/11 0655  WBC 8.0 10.5  NEUTROABS 5.9 7.8*  HGB 13.0 13.9  HCT 38.8 42.2  MCV 95.1 95.9  PLT 121* 127*   Coag Panel:   Lab Results  Component Value Date   INR 1.56* 09/12/2011   INR 1.66* 09/11/2011   INR 2.27* 09/10/2011    RADIOLOGY: Dg Chest 2 View  09/09/2011  *RADIOLOGY REPORT*  Clinical Data: Shortness of breath, nausea, vomiting.  CHEST - 2 VIEW  Comparison: Report from study 06/27/2003  Findings: Mild cardiomegaly.  No confluent airspace opacities, effusions or edema.  Degenerative changes in the thoracic spine.  IMPRESSION: Mild cardiomegaly.  No active disease.  Original Report Authenticated By: Cyndie Chime, M.D.   Dg Cholangiogram Operative  09/12/2011  *RADIOLOGY REPORT*  Clinical Data:   Cholelithiasis  INTRAOPERATIVE CHOLANGIOGRAM  Technique:  Cholangiographic images from the C-arm fluoroscopic device were  submitted for interpretation post-operatively.  Please see the procedural report for the amount of contrast and the fluoroscopy time utilized.  Comparison:  None  Findings:  No persistent filling defects in the common duct. Intrahepatic ducts are incompletely visualized, appearing decompressed centrally. Contrast passes into the duodenum.  IMPRESSION  Negative for retained common duct stone.  Original Report Authenticated By: Osa Craver, M.D.   US Abdomen Complete  09/09/2011  *RADIOLOGY REPORT*  Clinical Data:  Abdominal pain, gallstones  COMPLETE ABDOMINAL ULTRASOUND  Comparison:  CT scan same day  Findings:  Gallbladder:  Again noted distention of the gallbladder.  Gallstone in the gallbladder neck region again noted measures 1.1 cm.  No thickening of gallbladder wall.  There is positive sonographic Murphy's sign.  Clinical correlation is necessary to exclude early cholecystitis.  Common bile duct:  Measures 4.8 mm in diameter within normal limits.  Liver:  No focal lesion identified.  Within normal limits in parenchymal echogenicity.  IVC:  Appears normal.  Pancreas:  Limited visualization due to abundant bowel gas.  Spleen:  Measures 9.1 cm in length.  Normal echogenicity.  Right Kidney:  Measures 11.5 cm in length.  No mass, hydronephrosis or diagnostic renal calculus  Left Kidney:  Measures 10.7 cm in length.  No mass, hydronephrosis or diagnostic renal  calculi  Abdominal aorta:  No aneurysm identified. Measures up to 2.4 cm in diameter.  Limited visualization due to abundant bowel gas.  IMPRESSION:  1.  Again noted distended gallbladder.  Gallstones and gallbladder neck region measures 1.1 cm.  No thickening of gallbladder wall. There is positive sonographic Murphy's sign.  Clinical correlation is necessary to exclude early cholecystitis. 2.  Normal CBD. 3.  No hydronephrosis or diagnostic renal calculus.  Original Report Authenticated By: Natasha Mead, M.D.   Ct Abdomen Pelvis W  Contrast  09/09/2011  *RADIOLOGY REPORT*  Clinical Data: Abdominal pain and distension.  CT ABDOMEN AND PELVIS WITH CONTRAST  Technique:  Multidetector CT imaging of the abdomen and pelvis was performed following the standard protocol during bolus administration of intravenous contrast.  Contrast:  100 ml Omnipaque 300  Comparison: None.  Findings: Fibrosis or atelectasis in the lung bases.  The liver, spleen, pancreas, adrenal glands, and retroperitoneal lymph nodes are unremarkable.  Stone in the gallbladder neck with moderate gallbladder distension.  No pericholecystic inflammatory change.  Parenchymal and parapelvic cysts in the kidneys.  No solid renal mass or hydronephrosis.  Small accessory spleen.  The proximal stomach is distended but the pyloric region appears to be decompressed with suggestion of wall thickening.  This could be due to normal peristalsis but does appear persistent early and delayed images.  Gastric mass, stricture, or inflammatory process are not excluded.  Consider endoscopy if the patient has symptoms referable to the stomach.  Small bowel are decompressed.  Stool filled colon without distension.  Prominent visceral adipose tissue.  No free air or free fluid in the abdomen.  Surgical clips along the midline abdominal wall.  Pelvis:  The uterus and appendix are surgically absent.  No abnormal adnexal mass lesions.  No significant pelvic lymphadenopathy.  No free or loculated pelvic fluid collections. The bladder wall is not thickened.  No inflammatory changes in the sigmoid colon despite scattered diverticula.  Degenerative changes in the lumbar spine.  IMPRESSION: Stone in the gallbladder neck with moderate gallbladder distension. No wall thickening or inflammatory infiltration.  Decompression of the distal stomach with suggestion of wall thickening.  Changes may represent normal peristalsis versus inflammatory or neoplastic process. Consider endoscopy.  Original Report Authenticated By:  Marlon Pel, M.D.   Nm Myocar Multi W/spect W/wall Motion / Ef  09/11/2011  *RADIOLOGY REPORT*  Clinical Data:  Chest pain.  Preop.  MYOCARDIAL IMAGING WITH SPECT (REST AND PHARMACOLOGIC-STRESS - 2 DAY PROTOCOL) GATED LEFT VENTRICULAR WALL MOTION STUDY LEFT VENTRICULAR EJECTION FRACTION  Technique:  Standard myocardial SPECT imaging was performed after intravenous injection of 30 mCi Tc-22m tetrofosmin at rest.  On a different day, intravenous infusion of  Lexiscan was performed under supervision of the Cardiology staff.  At peak effect of the drug, 30 mCi Tc-63m tetrofosmin the was injected intravenously and standard myocardial SPECT imaging was performed.  Quantitative gated imaging was also performed to evaluate left ventricular wall motion and estimate left ventricular ejection fraction.  Comparison:  None  Findings: SPECT imaging demonstrates no reversible or irreversible defects to suggest ischemia or infarction.  Quantitative gated analysis shows normal wall motion.  The resting left ventricular ejection fraction is 76% with end- diastolic volume of 77 ml and end-systolic volume of 18 ml.  IMPRESSION: No evidence of ischemia or infarction.  Ejection fraction 76%.  Original Report Authenticated By: Cyndie Chime, M.D.   ASSESSMENT:  1.  S/P laparoscopic cholecystectomy postop day #1 doing well -  diet advanced 2.  H/O prior DVT with PE 3.  HTN 4.  Lower chest pain with normal nuclear stress test 5.  DOE with normal stress test and normal LVF  PLAN:   1.  Continue warfarin load  2.  D/C SQ Heparin once INR therapeutic 3.  No new recs will sign off  Quintella Reichert, MD  09/13/2011  11:04 AM

## 2011-09-13 NOTE — Progress Notes (Signed)
ANTICOAGULATION CONSULT NOTE - Intial Consult  Pharmacy Consult: Heparin / Coumadin Indication: h/o PE  No Known Allergies  Patient Measurements: Height: 5\' 10"  (177.8 cm) Weight: 262 lb 1.6 oz (118.888 kg) IBW/kg (Calculated) : 68.5  Heparin Dosing Weight: 96 kg  Vital Signs: Temp: 98.6 F (37 C) (04/06 0528) BP: 139/58 mmHg (04/06 0528) Pulse Rate: 69  (04/06 0528)  Labs:  Basename 09/12/11 0545 09/11/11 1737 09/11/11 0655  HGB 13.0 -- 13.9  HCT 38.8 -- 42.2  PLT 121* -- 127*  APTT -- -- --  LABPROT 19.0* -- 19.9*  INR 1.56* -- 1.66*  HEPARINUNFRC -- 0.18* <0.10*  CREATININE -- -- --  CKTOTAL -- -- --  CKMB -- -- --  TROPONINI -- -- --   Estimated Creatinine Clearance: 81.2 ml/min (by C-G formula based on Cr of 0.71).   Medications:  Scheduled:     . ciprofloxacin  400 mg Intravenous Q12H  . heparin  5,000 Units Subcutaneous Q8H  . ondansetron      . DISCONTD: antiseptic oral rinse  15 mL Mouth Rinse q12n4p  . DISCONTD: atenolol  25 mg Oral Daily  . DISCONTD: chlorhexidine  15 mL Mouth Rinse BID  . DISCONTD: colchicine  0.6 mg Oral Daily  . DISCONTD: levothyroxine  75 mcg Oral QAC breakfast  . DISCONTD: pantoprazole (PROTONIX) IV  40 mg Intravenous QHS   Infusions:     . dextrose 5 % and 0.45 % NaCl with KCl 20 mEq/L 100 mL/hr at 09/13/11 0145  . DISCONTD: heparin Stopped (09/12/11 0200)  . DISCONTD: lactated ringers 50 mL/hr at 09/12/11 0839     Assessment: 15 YOF with history of multiple PEs on Coumadin PTA.  Coumadin was on hold and was on heparin bridge for surgery.  Now s/p cholecystectomy and Pharmacy consulted to manage Coumadin.  Patient currently on heparin SQ TID.  INR subtherapeutic at 1.56, no bleeding reported.  Goal of Therapy:  INR 2 - 3    Plan:  - Coumadin 10mg  PO today - Continue heparin SQ until INR therapeutic - Daily PT/ INR    Parissa Chiao D. Laney Potash, PharmD, BCPS Pager:  [phone] 09/13/2011, 11:31 AM

## 2011-09-13 NOTE — Progress Notes (Signed)
1 Day Post-Op  Subjective: No complaints  POD#1 Pain controlled hungry  Objective: Vital signs in last 24 hours: Temp:  [97.9 F (36.6 C)-98.7 F (37.1 C)] 98.6 F (37 C) (04/06 0528) Pulse Rate:  [69-87] 69  (04/06 0528) Resp:  [12-20] 20  (04/06 0528) BP: (139-160)/(47-100) 139/58 mmHg (04/06 0528) SpO2:  [95 %-99 %] 99 % (04/06 0528) Last BM Date: 09/12/11  Intake/Output from previous day: 04/05 0701 - 04/06 0700 In: 4331.7 [I.V.:4331.7] Out: 200 [Urine:150; Blood:50] Intake/Output this shift:    Lungs clear Abdomen soft, incisions clean  Lab Results:   Basename 09/12/11 0545 09/11/11 0655  WBC 8.0 10.5  HGB 13.0 13.9  HCT 38.8 42.2  PLT 121* 127*   BMET No results found for this basename: NA:2,K:2,CL:2,CO2:2,GLUCOSE:2,BUN:2,CREATININE:2,CALCIUM:2 in the last 72 hours PT/INR  Basename 09/12/11 0545 09/11/11 0655  LABPROT 19.0* 19.9*  INR 1.56* 1.66*   ABG No results found for this basename: PHART:2,PCO2:2,PO2:2,HCO3:2 in the last 72 hours  Studies/Results: Dg Cholangiogram Operative  09/12/2011  *RADIOLOGY REPORT*  Clinical Data:   Cholelithiasis  INTRAOPERATIVE CHOLANGIOGRAM  Technique:  Cholangiographic images from the C-arm fluoroscopic device were submitted for interpretation post-operatively.  Please see the procedural report for the amount of contrast and the fluoroscopy time utilized.  Comparison:  None  Findings:  No persistent filling defects in the common duct. Intrahepatic ducts are incompletely visualized, appearing decompressed centrally. Contrast passes into the duodenum.  IMPRESSION  Negative for retained common duct stone.  Original Report Authenticated By: Osa Craver, M.D.   Nm Myocar Multi W/spect W/wall Motion / Ef  09/11/2011  *RADIOLOGY REPORT*  Clinical Data:  Chest pain.  Preop.  MYOCARDIAL IMAGING WITH SPECT (REST AND PHARMACOLOGIC-STRESS - 2 DAY PROTOCOL) GATED LEFT VENTRICULAR WALL MOTION STUDY LEFT VENTRICULAR EJECTION  FRACTION  Technique:  Standard myocardial SPECT imaging was performed after intravenous injection of 30 mCi Tc-38m tetrofosmin at rest.  On a different day, intravenous infusion of  Lexiscan was performed under supervision of the Cardiology staff.  At peak effect of the drug, 30 mCi Tc-63m tetrofosmin the was injected intravenously and standard myocardial SPECT imaging was performed.  Quantitative gated imaging was also performed to evaluate left ventricular wall motion and estimate left ventricular ejection fraction.  Comparison:  None  Findings: SPECT imaging demonstrates no reversible or irreversible defects to suggest ischemia or infarction.  Quantitative gated analysis shows normal wall motion.  The resting left ventricular ejection fraction is 76% with end- diastolic volume of 77 ml and end-systolic volume of 18 ml.  IMPRESSION: No evidence of ischemia or infarction.  Ejection fraction 76%.  Original Report Authenticated By: Cyndie Chime, M.D.    Anti-infectives: Anti-infectives     Start     Dose/Rate Route Frequency Ordered Stop   09/10/11 0900   ciprofloxacin (CIPRO) IVPB 400 mg        400 mg 200 mL/hr over 60 Minutes Intravenous Every 12 hours 09/10/11 0848            Assessment/Plan: s/p Procedure(s) (LRB): LAPAROSCOPIC CHOLECYSTECTOMY WITH INTRAOPERATIVE CHOLANGIOGRAM (N/A)  Advance po Continue coumadin D/c when therapeutic  LOS: 4 days    Angelyne Terwilliger A 09/13/2011

## 2011-09-14 LAB — PROTIME-INR
INR: 1.26 (ref 0.00–1.49)
Prothrombin Time: 16.1 seconds — ABNORMAL HIGH (ref 11.6–15.2)

## 2011-09-14 MED ORDER — WARFARIN SODIUM 10 MG PO TABS
10.0000 mg | ORAL_TABLET | Freq: Once | ORAL | Status: AC
  Administered 2011-09-14: 10 mg via ORAL
  Filled 2011-09-14: qty 1

## 2011-09-14 NOTE — Progress Notes (Signed)
2 Days Post-Op  Subjective: Comfortable, ambulating  Objective: Vital signs in last 24 hours: Temp:  [98 F (36.7 C)-99.3 F (37.4 C)] 99.3 F (37.4 C) (04/07 0621) Pulse Rate:  [54-100] 85  (04/07 0621) Resp:  [18] 18  (04/07 0621) BP: (109-121)/(46-55) 121/55 mmHg (04/07 0621) SpO2:  [95 %-96 %] 96 % (04/07 0621) Last BM Date: 09/12/11  Intake/Output from previous day: 04/06 0701 - 04/07 0700 In: 4183 [P.O.:1520; I.V.:1263; IV Piggyback:1400] Out: -  Intake/Output this shift:   Abdomen soft, nontender Lungs clear  Lab Results:   Basename 09/12/11 0545  WBC 8.0  HGB 13.0  HCT 38.8  PLT 121*   BMET No results found for this basename: NA:2,K:2,CL:2,CO2:2,GLUCOSE:2,BUN:2,CREATININE:2,CALCIUM:2 in the last 72 hours PT/INR  Basename 09/14/11 0506 09/12/11 0545  LABPROT 16.1* 19.0*  INR 1.26 1.56*   ABG No results found for this basename: PHART:2,PCO2:2,PO2:2,HCO3:2 in the last 72 hours  Studies/Results: Dg Cholangiogram Operative  09/12/2011  *RADIOLOGY REPORT*  Clinical Data:   Cholelithiasis  INTRAOPERATIVE CHOLANGIOGRAM  Technique:  Cholangiographic images from the C-arm fluoroscopic device were submitted for interpretation post-operatively.  Please see the procedural report for the amount of contrast and the fluoroscopy time utilized.  Comparison:  None  Findings:  No persistent filling defects in the common duct. Intrahepatic ducts are incompletely visualized, appearing decompressed centrally. Contrast passes into the duodenum.  IMPRESSION  Negative for retained common duct stone.  Original Report Authenticated By: Osa Craver, M.D.    Anti-infectives: Anti-infectives     Start     Dose/Rate Route Frequency Ordered Stop   09/10/11 0900   ciprofloxacin (CIPRO) IVPB 400 mg        400 mg 200 mL/hr over 60 Minutes Intravenous Every 12 hours 09/10/11 0848            Assessment/Plan: s/p Procedure(s) (LRB): LAPAROSCOPIC CHOLECYSTECTOMY WITH  INTRAOPERATIVE CHOLANGIOGRAM (N/A)  Continue adjusting coumadin  LOS: 5 days    Blannie Shedlock A 09/14/2011

## 2011-09-14 NOTE — Progress Notes (Signed)
ANTICOAGULATION CONSULT NOTE - FOLLOW UP  Pharmacy Consult: Coumadin Indication: h/o PE  No Known Allergies  Patient Measurements: Height: 5\' 10"  (177.8 cm) Weight: 262 lb 1.6 oz (118.888 kg) IBW/kg (Calculated) : 68.5   Vital Signs: Temp: 99.3 F (37.4 C) (04/07 0621) BP: 121/55 mmHg (04/07 0621) Pulse Rate: 85  (04/07 0621)  Labs:  Basename 09/14/11 0506 09/12/11 0545 09/11/11 1737  HGB -- 13.0 --  HCT -- 38.8 --  PLT -- 121* --  APTT -- -- --  LABPROT 16.1* 19.0* --  INR 1.26 1.56* --  HEPARINUNFRC -- -- 0.18*  CREATININE -- -- --  CKTOTAL -- -- --  CKMB -- -- --  TROPONINI -- -- --   Estimated Creatinine Clearance: 81.2 ml/min (by C-G formula based on Cr of 0.71).   Medications:  Scheduled:     . atenolol  25 mg Oral Daily  . colchicine  0.6 mg Oral Daily  . heparin  5,000 Units Subcutaneous Q8H  . levothyroxine  75 mcg Oral QAC breakfast  . warfarin  10 mg Oral ONCE-1800  . Warfarin - Pharmacist Dosing Inpatient   Does not apply q1800  . DISCONTD: ciprofloxacin  400 mg Intravenous Q12H   Infusions:     . dextrose 5 % and 0.45 % NaCl with KCl 20 mEq/L 20 mL/hr (09/13/11 1611)     Assessment: 32 YOF with history of multiple PEs on Coumadin and heparin SQ.  Coumadin resumed 09/13/11 - INR subtherapeutic and has trended down to 1.26, no bleeding reported.   PTA dose: alternating between 7mg  and 8mg  doses   Goal of Therapy:  INR 2 - 3    Plan:  - Repeat Coumadin 10mg  PO today - Continue heparin SQ until INR therapeutic - Daily PT/ INR    Landrie Beale D. Laney Potash, PharmD, BCPS Pager:  [phone] 09/14/2011, 11:45 AM

## 2011-09-15 ENCOUNTER — Encounter (HOSPITAL_COMMUNITY): Payer: Self-pay | Admitting: Surgery

## 2011-09-15 DIAGNOSIS — I4891 Unspecified atrial fibrillation: Secondary | ICD-10-CM

## 2011-09-15 LAB — PROTIME-INR
INR: 1.18 (ref 0.00–1.49)
Prothrombin Time: 15.3 seconds — ABNORMAL HIGH (ref 11.6–15.2)

## 2011-09-15 MED ORDER — WARFARIN SODIUM 2.5 MG PO TABS
12.5000 mg | ORAL_TABLET | Freq: Once | ORAL | Status: AC
  Administered 2011-09-15: 12.5 mg via ORAL
  Filled 2011-09-15 (×2): qty 1

## 2011-09-15 NOTE — Progress Notes (Signed)
Subjective: Pain resolved. No nausea or emesis. Patient had BM today. Feels ready for discharge, has some chores to do at home.  Objective: Vital signs in last 24 hours: Temp:  [98.6 F (37 C)-99.9 F (37.7 C)] 98.6 F (37 C) (04/08 0640) Pulse Rate:  [72-104] 72  (04/08 0640) Resp:  [18] 18  (04/08 0640) BP: (111-141)/(61-77) 135/77 mmHg (04/08 0640) SpO2:  [94 %-96 %] 96 % (04/08 0640) Weight change:  Last BM Date: 09/15/11  Intake/Output from previous day: 04/07 0701 - 04/08 0700 In: 960 [P.O.:960] Out: -  Intake/Output this shift:    General appearance: alert, cooperative and no distress Resp: clear to auscultation bilaterally Cardio: irregular rhythm at times. no murmur. GI: Soft, ND. TTP in periumbilical area, LUQ. BS present.  Extremities: nonpitting edema bilateral LE, trace-1+. Tender at baseline. Skin changes of venous insufficiency. Neurologic: Grossly normal  Lab Results: No results found for this basename: WBC:2,HGB:2,HCT:2,PLT:2 in the last 72 hours BMET No results found for this basename: NA:2,K:2,CL:2,CO2:2,GLUCOSE:2,BUN:2,CREATININE:2,CALCIUM:2 in the last 72 hours INR/Prothrombin Time Lab Results  Component Value Date   INR 1.18 09/15/2011   INR 1.26 09/14/2011   INR 1.56* 09/12/2011     Studies/Results: No results found. 2D Echo: EF 55-60%, trivial mitral valve prolapse, PAP 33 mmHg  Medications:  Scheduled:    . atenolol  25 mg Oral Daily  . colchicine  0.6 mg Oral Daily  . heparin  5,000 Units Subcutaneous Q8H  . levothyroxine  75 mcg Oral QAC breakfast  . warfarin  10 mg Oral ONCE-1800  . Warfarin - Pharmacist Dosing Inpatient   Does not apply q1800  . DISCONTD: ciprofloxacin  400 mg Intravenous Q12H   UJW:[id number], HYDROcodone-acetaminophen, morphine, ondansetron (ZOFRAN) IV, ondansetron  Assessment/Plan: 78 yo female with history PE/DVT presents with biliary colic, cholelithiasis of gallbladder neck now POD #3 from laparoscopic  cholecystectomy.  1. Lap chole POD #3-patient tolerated advancement of diet, no sign ileus.  2. Hx VTE. Hx of recurrent post-operative DVT and PE. Heparin bridge pending therapeutic INR. Coumadin per pharmacy. INR down today, despite receiving 10 mg over weekend. Will discuss options including DC with lovenox bridge vs continuation of inpatient bridge to INR 2-3.   3.  Thrombocytopenia -stable, f/u CBC.  4. HTN. On home dose atenolol, stable.  5. Dispo. Pending anticoagulation plan, will d/w Dr. Janee Morn. Otherwise stable postoperatively.    LOS: 6 days   Sung Parodi PGY-2 09/15/2011, 9:13 AM

## 2011-09-15 NOTE — Progress Notes (Signed)
Await INR Patient examined and I agree with the assessment and plan  Violeta Gelinas, MD, MPH, FACS Pager: [phone]  09/15/2011 1:05 PM

## 2011-09-15 NOTE — Anesthesia Postprocedure Evaluation (Signed)
  Anesthesia Post-op Note  Patient: Debbie Richards  Procedure(s) Performed: Procedure(s) (LRB): LAPAROSCOPIC CHOLECYSTECTOMY WITH INTRAOPERATIVE CHOLANGIOGRAM (N/A)  Patient Location: PACU  Anesthesia Type: General  Level of Consciousness: awake, alert , oriented and patient cooperative  Airway and Oxygen Therapy: Patient Spontanous Breathing and Patient connected to nasal cannula oxygen  Post-op Pain: mild  Post-op Assessment: Post-op Vital signs reviewed, Patient's Cardiovascular Status Stable, Respiratory Function Stable, Patent Airway, No signs of Nausea or vomiting and Pain level controlled  Post-op Vital Signs: stable  Complications: No apparent anesthesia complications

## 2011-09-15 NOTE — Progress Notes (Signed)
ANTICOAGULATION CONSULT NOTE - FOLLOW UP  Pharmacy Consult: Coumadin Indication: h/o PE  No Known Allergies  Patient Measurements: Height: 5\' 10"  (177.8 cm) Weight: 262 lb 1.6 oz (118.888 kg) IBW/kg (Calculated) : 68.5   Vital Signs: Temp: 98.6 F (37 C) (04/08 0640) BP: 135/77 mmHg (04/08 0640) Pulse Rate: 72  (04/08 0640)  Labs:  Basename 09/15/11 0554 09/14/11 0506  HGB -- --  HCT -- --  PLT -- --  APTT -- --  LABPROT 15.3* 16.1*  INR 1.18 1.26  HEPARINUNFRC -- --  CREATININE -- --  CKTOTAL -- --  CKMB -- --  TROPONINI -- --   Estimated Creatinine Clearance: 81.2 ml/min (by C-G formula based on Cr of 0.71).   Medications:  Scheduled:     . atenolol  25 mg Oral Daily  . colchicine  0.6 mg Oral Daily  . heparin  5,000 Units Subcutaneous Q8H  . levothyroxine  75 mcg Oral QAC breakfast  . warfarin  10 mg Oral ONCE-1800  . Warfarin - Pharmacist Dosing Inpatient   Does not apply q1800   Infusions:     . dextrose 5 % and 0.45 % NaCl with KCl 20 mEq/L 20 mL/hr (09/13/11 1611)     Assessment: Debbie Richards with history of multiple PEs on Coumadin and heparin SQ.  Coumadin resumed 09/13/11 - INR subtherapeutic and has trended down to 1.18 - both of coumadin doses charted. No bleeding reported.   PTA dose: alternating between 7mg  and 8mg  doses  Goal of Therapy:  INR 2 - 3   Plan:  - Coumadin 12.5 mg PO today - Daily PT/ INR - Consider full dose lovenox instead of VTE prophylactic dose of heparin in pt with h/o DVT/PEs until INR therapeutic  Christoper Fabian, PharmD, BCPS Clinical pharmacist, pager [phone] 09/15/2011, 11:38 AM

## 2011-09-16 LAB — CBC
HCT: 41.6 % (ref 36.0–46.0)
Hemoglobin: 13.9 g/dL (ref 12.0–15.0)
MCH: 31.6 pg (ref 26.0–34.0)
MCHC: 33.4 g/dL (ref 30.0–36.0)
MCV: 94.5 fL (ref 78.0–100.0)
Platelets: 196 10*3/uL (ref 150–400)
RBC: 4.4 MIL/uL (ref 3.87–5.11)
RDW: 12.6 % (ref 11.5–15.5)
WBC: 5.4 10*3/uL (ref 4.0–10.5)

## 2011-09-16 LAB — BASIC METABOLIC PANEL
BUN: 15 mg/dL (ref 6–23)
CO2: 29 mEq/L (ref 19–32)
Calcium: 9.7 mg/dL (ref 8.4–10.5)
Chloride: 103 mEq/L (ref 96–112)
Creatinine, Ser: 0.8 mg/dL (ref 0.50–1.10)
GFR calc Af Amer: 80 mL/min — ABNORMAL LOW (ref >90)
GFR calc non Af Amer: 69 mL/min — ABNORMAL LOW (ref >90)
Glucose, Bld: 118 mg/dL — ABNORMAL HIGH (ref 70–99)
Potassium: 4.7 mEq/L (ref 3.5–5.1)
Sodium: 139 mEq/L (ref 135–145)

## 2011-09-16 LAB — PROTIME-INR
INR: 1.23 (ref 0.00–1.49)
Prothrombin Time: 15.8 seconds — ABNORMAL HIGH (ref 11.6–15.2)

## 2011-09-16 MED ORDER — WARFARIN SODIUM 2.5 MG PO TABS
12.5000 mg | ORAL_TABLET | Freq: Once | ORAL | Status: AC
  Administered 2011-09-16: 12.5 mg via ORAL
  Filled 2011-09-16: qty 1

## 2011-09-16 NOTE — Progress Notes (Signed)
Patient ID: Debbie Richards, female   DOB: 08-30-1930, 78 y.o.   MRN: [id number] 4 Days Post-Op  Subjective: Pt feels great.  Ready to go home.    Objective: Vital signs in last 24 hours: Temp:  [97.8 F (36.6 C)-98.6 F (37 C)] 97.8 F (36.6 C) (04/09 0542) Pulse Rate:  [68-72] 68  (04/09 0542) Resp:  [18] 18  (04/09 0542) BP: (124-151)/(43-58) 151/58 mmHg (04/09 0542) SpO2:  [97 %-98 %] 98 % (04/09 0542) Last BM Date: 09/15/11  Intake/Output from previous day:   Intake/Output this shift:    PE: Abd: soft, Nt, Nd, +BS, incisions c/d/i  Lab Results:   Basename 09/16/11 0719  WBC 5.4  HGB 13.9  HCT 41.6  PLT 196   BMET  Basename 09/16/11 0719  NA 139  K 4.7  CL 103  CO2 29  GLUCOSE 118*  BUN 15  CREATININE 0.80  CALCIUM 9.7   PT/INR  Basename 09/16/11 0719 09/15/11 0554  LABPROT 15.8* 15.3*  INR 1.23 1.18   CMP     Component Value Date/Time   NA 139 09/16/2011 0719   K 4.7 09/16/2011 0719   CL 103 09/16/2011 0719   CO2 29 09/16/2011 0719   GLUCOSE 118* 09/16/2011 0719   BUN 15 09/16/2011 0719   CREATININE 0.80 09/16/2011 0719   CALCIUM 9.7 09/16/2011 0719   PROT 6.0 09/10/2011 0530   ALBUMIN 3.3* 09/10/2011 0530   AST 19 09/10/2011 0530   ALT 10 09/10/2011 0530   ALKPHOS 58 09/10/2011 0530   BILITOT 1.1 09/10/2011 0530   GFRNONAA 69* 09/16/2011 0719   GFRAA 80* 09/16/2011 0719   Lipase     Component Value Date/Time   LIPASE 35 09/09/2011 0354       Studies/Results: No results found.  Anti-infectives: Anti-infectives     Start     Dose/Rate Route Frequency Ordered Stop   09/10/11 0900   ciprofloxacin (CIPRO) IVPB 400 mg  Status:  Discontinued        400 mg 200 mL/hr over 60 Minutes Intravenous Every 12 hours 09/10/11 0848 09/14/11 0937           Assessment/Plan  1. S/p lap chole 2. H/o DVT/PE  Plan: 1. Await INR to get closer to 2 before she is able to be discharged.  I d/w her again.  She understands and is very appreciative of [place] looking out for her  best interest.   LOS: 7 days    Evelynne Spiers E 09/16/2011

## 2011-09-16 NOTE — Progress Notes (Signed)
Patient examined and I agree with the assessment and plan  Violeta Gelinas, MD, MPH, FACS Pager: [phone]  09/16/2011 3:16 PM

## 2011-09-16 NOTE — Progress Notes (Signed)
ANTICOAGULATION CONSULT NOTE - FOLLOW UP  Pharmacy Consult: Coumadin Indication: h/o PE  No Known Allergies  Patient Measurements: Height: 5\' 10"  (177.8 cm) Weight: 262 lb 1.6 oz (118.888 kg) IBW/kg (Calculated) : 68.5   Vital Signs: Temp: 97.8 F (36.6 C) (04/09 0542) Temp src: Oral (04/09 0542) BP: 151/58 mmHg (04/09 0542) Pulse Rate: 68  (04/09 0542)  Labs:  Basename 09/16/11 0719 09/15/11 0554 09/14/11 0506  HGB 13.9 -- --  HCT 41.6 -- --  PLT 196 -- --  APTT -- -- --  LABPROT 15.8* 15.3* 16.1*  INR 1.23 1.18 1.26  HEPARINUNFRC -- -- --  CREATININE 0.80 -- --  CKTOTAL -- -- --  CKMB -- -- --  TROPONINI -- -- --   Estimated Creatinine Clearance: 81.2 ml/min (by C-G formula based on Cr of 0.8).   Medications:  Scheduled:     . atenolol  25 mg Oral Daily  . colchicine  0.6 mg Oral Daily  . heparin  5,000 Units Subcutaneous Q8H  . levothyroxine  75 mcg Oral QAC breakfast  . warfarin  12.5 mg Oral ONCE-1800  . Warfarin - Pharmacist Dosing Inpatient   Does not apply q1800   Infusions:     . dextrose 5 % and 0.45 % NaCl with KCl 20 mEq/L 20 mL/hr (09/13/11 1611)     Assessment: 78 y.o. F with history of multiple PEs on Coumadin and heparin SQ.  Coumadin resumed 09/13/11 - INR remains SUBtherapeutic today however is trending up (INR 1.23 << 1.18, goal of 2-3). The patient is receiving higher doses than she was receiving PTA (PTA dose was alternating between 7mg  and 8 mg doses). Hgb/Hct/Plt ok today. No s/sx of bleeding noted.   Goal of Therapy:  INR 2 - 3   Plan:  1. Warfarin 12.5 mg x1 dose at 1800 today 2. Consider full dose lovenox instead of VTE prophylactic dose of heparin in pt with h/o DVT/PEs until INR therapeutic 3. Will continue to monitor for any signs/symptoms of bleeding and will follow up with PT/INR in the a.m.  Georgina Pillion, PharmD, BCPS Clinical Pharmacist Pager: [phone] 09/16/2011 1:14 PM

## 2011-09-17 LAB — PROTIME-INR
INR: 1.43 (ref 0.00–1.49)
Prothrombin Time: 17.7 seconds — ABNORMAL HIGH (ref 11.6–15.2)

## 2011-09-17 MED ORDER — ENOXAPARIN SODIUM 120 MG/0.8ML ~~LOC~~ SOLN
1.0000 mg/kg | Freq: Two times a day (BID) | SUBCUTANEOUS | Status: DC
  Administered 2011-09-17 – 2011-09-18 (×3): 120 mg via SUBCUTANEOUS
  Filled 2011-09-17 (×4): qty 0.8

## 2011-09-17 MED ORDER — WARFARIN SODIUM 2.5 MG PO TABS
12.5000 mg | ORAL_TABLET | Freq: Once | ORAL | Status: AC
  Administered 2011-09-17: 12.5 mg via ORAL
  Filled 2011-09-17: qty 1

## 2011-09-17 NOTE — Progress Notes (Signed)
UR of chart complete.  

## 2011-09-17 NOTE — Progress Notes (Signed)
Patient examined and I agree with the assessment and plan  Violeta Gelinas, MD, MPH, FACS Pager: [phone]  09/17/2011 5:11 PM

## 2011-09-17 NOTE — Progress Notes (Signed)
Patient ID: Debbie Richards, female   DOB: [birth year], 78 y.o.   MRN: [id number] 5 Days Post-Op  Subjective: Pt feels great.  Ready to go home.  No easy bruising, bleeding. Had symmetric leg swelling yesterday that resolved with elevation. No leg pain. Tolerating full diet.   Objective: Vital signs in last 24 hours: Temp:  [97.2 F (36.2 C)-98.6 F (37 C)] 97.2 F (36.2 C) (04/10 0524) Pulse Rate:  [73-76] 73  (04/10 0524) Resp:  [18-19] 18  (04/10 0524) BP: (130-140)/(61-81) 140/81 mmHg (04/10 0524) SpO2:  [97 %-98 %] 98 % (04/10 0524) Last BM Date: 09/16/11  Intake/Output from previous day: 04/09 0701 - 04/10 0700 In: 120 [P.O.:120] Out: 2500 [Urine:2500] Intake/Output this shift:    PE: Gen: NAD, sitting at bedside CV: RRR PULM: nonlabored Abd: soft, Nt, Nd, +BS, incisions c/d/i LE: bilateral LE with symmetric 1+ nonpitting edema. Chronic venous insufficiency skin bronzing.   Lab Results:   [facility] 09/16/11 0719  WBC 5.4  HGB 13.9  HCT 41.6  PLT 196   BMET  Basename 09/16/11 0719  NA 139  K 4.7  CL 103  CO2 29  GLUCOSE 118*  BUN 15  CREATININE 0.80  CALCIUM 9.7   PT/INR  Basename 09/17/11 0630 09/16/11 0719  LABPROT 17.7* 15.8*  INR 1.43 1.23   CMP     Component Value Date/Time   NA 139 09/16/2011 0719   K 4.7 09/16/2011 0719   CL 103 09/16/2011 0719   CO2 29 09/16/2011 0719   GLUCOSE 118* 09/16/2011 0719   BUN 15 09/16/2011 0719   CREATININE 0.80 09/16/2011 0719   CALCIUM 9.7 09/16/2011 0719   PROT 6.0 09/10/2011 0530   ALBUMIN 3.3* 09/10/2011 0530   AST 19 09/10/2011 0530   ALT 10 09/10/2011 0530   ALKPHOS 58 09/10/2011 0530   BILITOT 1.1 09/10/2011 0530   GFRNONAA 69* 09/16/2011 0719   GFRAA 80* 09/16/2011 0719   Lipase     Component Value Date/Time   LIPASE 35 09/09/2011 0354        . atenolol  25 mg Oral Daily  . colchicine  0.6 mg Oral Daily  . enoxaparin (LOVENOX) injection  1 mg/kg Subcutaneous Q12H  . levothyroxine  75 mcg Oral QAC breakfast  .  warfarin  12.5 mg Oral ONCE-1800  . Warfarin - Pharmacist Dosing Inpatient   Does not apply q1800  . DISCONTD: heparin  5,000 Units Subcutaneous Q8H     Studies/Results: No results found.  Anti-infectives: Anti-infectives     Start     Dose/Rate Route Frequency Ordered Stop   09/10/11 0900   ciprofloxacin (CIPRO) IVPB 400 mg  Status:  Discontinued        400 mg 200 mL/hr over 60 Minutes Intravenous Every 12 hours 09/10/11 0848 09/14/11 0937           Assessment/Plan  1. S/p lap chole 2. H/o DVT/PE  Plan: 1. Await INR to get closer to 2 before she is able to be discharged. 2. Adjusted anticoagulation bridge to therapeutic dose lovenox, instead of prophylactic dose heparin in case she can be discharged (will need 48 hr overlap lovenox once therapeutic INR reached).      LOS: 8 days    [facility] PGY-2 09/17/2011

## 2011-09-17 NOTE — Progress Notes (Signed)
Nutrition Brief Note:  Pt's intake remains adequate, no change in status noted.  PO 100% of meals consistently.  Pt often found walking in halls.  Remains inpatient for sub therapeutic INR.  No new wt; however no evidence of wt loss.    No nutrition dx or warranted interventions at this time.  RD to continue to screen for nutritional adequacy and change in status.  Please consult if warranted.  Hoyt Koch Pager: [phone]

## 2011-09-17 NOTE — Progress Notes (Signed)
ANTICOAGULATION CONSULT NOTE - FOLLOW UP  Pharmacy Consult: Lovenox/Coumadin Indication: h/o PE  No Known Allergies  Patient Measurements: Height: 5\' 10"  (177.8 cm) Weight: 262 lb 1.6 oz (118.888 kg) IBW/kg (Calculated) : 68.5   Vital Signs: Temp: 97.2 F (36.2 C) (04/10 0524) Temp src: Oral (04/10 0524) BP: 140/81 mmHg (04/10 0524) Pulse Rate: 73  (04/10 0524)  Labs:  Basename 09/17/11 0630 09/16/11 0719 09/15/11 0554  HGB -- 13.9 --  HCT -- 41.6 --  PLT -- 196 --  APTT -- -- --  LABPROT 17.7* 15.8* 15.3*  INR 1.43 1.23 1.18  HEPARINUNFRC -- -- --  CREATININE -- 0.80 --  CKTOTAL -- -- --  CKMB -- -- --  TROPONINI -- -- --   Estimated Creatinine Clearance: 81.2 ml/min (by C-G formula based on Cr of 0.8).   Medications:  Scheduled:     . atenolol  25 mg Oral Daily  . colchicine  0.6 mg Oral Daily  . enoxaparin (LOVENOX) injection  1 mg/kg Subcutaneous Q12H  . levothyroxine  75 mcg Oral QAC breakfast  . warfarin  12.5 mg Oral ONCE-1800  . Warfarin - Pharmacist Dosing Inpatient   Does not apply q1800  . DISCONTD: heparin  5,000 Units Subcutaneous Q8H   Infusions:     . dextrose 5 % and 0.45 % NaCl with KCl 20 mEq/L 20 mL/hr (09/13/11 1611)     Assessment: 78 y.o. F with history of multiple PEs to start lovenox bridge until INR >2 on warfarin. Warfarin resumed 09/13/11 - INR remains SUBtherapeutic today however is trending up (INR 1.43 << 1.23, goal of 2-3). The patient is receiving higher doses than she was receiving PTA (PTA dose was alternating between 7mg  and 8 mg doses). No CBC this a.m, no s/sx of bleeding noted. The patient can be discharged home on lovenox bridge with warfarin as long as she is closely monitored by her warfarin clinic since she has been receiving higher doses of warfarin this admission than her original home dose.   Goal of Therapy:  INR 2 - 3   Plan:  1. Warfarin 12.5 mg x1 dose at 1800 today 2. Lovenox 120 mg SQ every 12 hours for  bridging while INR <2 3. Will continue to monitor for any signs/symptoms of bleeding and will follow up with PT/INR in the a.m.  Georgina Pillion, PharmD, BCPS Clinical Pharmacist Pager: [phone] 09/17/2011 8:40 AM

## 2011-09-18 LAB — PROTIME-INR
INR: 1.68 — ABNORMAL HIGH (ref 0.00–1.49)
Prothrombin Time: 20.1 seconds — ABNORMAL HIGH (ref 11.6–15.2)

## 2011-09-18 MED ORDER — ENOXAPARIN SODIUM 150 MG/ML ~~LOC~~ SOLN: 1.0000 mg/kg | Freq: Two times a day (BID) | SUBCUTANEOUS | Status: DC

## 2011-09-18 MED ORDER — ENOXAPARIN (LOVENOX) PATIENT EDUCATION KIT
PACK | Freq: Once | Status: DC
  Filled 2011-09-18: qty 1

## 2011-09-18 NOTE — Discharge Summary (Signed)
Patient ID: DALANI METTE MRN: [id number] DOB/AGE: 1930/10/06 78 y.o.  Admit date: 09/09/2011 Discharge date: 09/18/2011  Procedures: myoview lexiscan Laparoscopic cholecystectomy  Consults: cardiology and internal medicine; Dr. Garnette Scheuermann and Dr. Celso Sickle  Reason for Admission: This is a 78 yo female who presented to the [facility] with epigastric pain and was found to have biliary colic with a stone stuck in the gallbladder neck.  She was admitted for further treatment.  Please see admitting H&P for further details.  Admission Diagnoses:  1. Biliary colic 2. SOB on exertion 3. Chronic anticoagulation 4. H/O DVTs/PEs  Hospital Course: The patient was admitted and placed on IV Cipro for her gallbladder.  She admitted to SOB on exertion with just ambulating to the mailbox.  We consulted internal medicine for pre-operative clearance.  They asked cardiology to evaluate.  She had a myoview, which was negative for ischemia.  She was then cleared for surgical intervention.  Her coumadin was allowed to drift down during this pre-operative workup.  On hospital day number 3 she was taken to the operating room where she underwent a lap chole.  (Prior to this she was placed on a heparin drip while her coumadin was subtherapeutic)  She tolerated her procedure well.  Post-operatively, she was started back on her coumadin per pharmacy and her BID lovenox bridge.  She was initially placed on 10mg  of coumadin, but had to be increased to 12.5mg  in order to finally get her INR to 1.68 on POD # 6.  Once her INR reached this level we were more comfortable, given her history, to be able to send her home with a Lovenox bridge.  I have spoken with her PCP, Dr. Lenise Arena who will have her come in tomorrow, 09-19-11, to the lab for an INR check.  He will then adjust her coumadin as needed.  She will also have a prescription for Lovenox to bridge her while her INR is subtherapeutic.  The patient is otherwise well and stable  and ready for discharge today.  She is eating well with no complaints.  She had not needed oral pain medication.  PE: Abd: soft, NT, ND, +BS, incisions c/d/i  Discharge Diagnoses:  Principal Problem:  *Biliary colic Active Problems:  Shortness of breath on exertion  Anticoagulated on Coumadin s/p lap chole H/O DVTs/PEs  Discharge Medications: Medication List  As of 09/18/2011 10:30 AM   TAKE these medications         acetaminophen 500 MG tablet   Commonly known as: TYLENOL   Take 500 mg by mouth every 6 (six) hours as needed. For pain      atenolol 25 MG tablet   Commonly known as: TENORMIN   Take 25 mg by mouth daily.      colesevelam 625 MG tablet   Commonly known as: WELCHOL   Take 1,250 mg by mouth daily after lunch daily after lunch.      enoxaparin 150 MG/ML injection   Commonly known as: LOVENOX   Inject 0.79 mLs (120 mg total) into the skin every 12 (twelve) hours.      levothyroxine 75 MCG tablet   Commonly known as: SYNTHROID, LEVOTHROID   Take 75 mcg by mouth daily.      warfarin 5 MG tablet   Commonly known as: COUMADIN   Take 7-8 mg by mouth daily. Alternating doses. Take 1 tablet (5mg ) plus two 1mg  tablets to make a total of 7mg  one day. On the next day take  1 tablet (5mg ) and three 1mg  tablets to make a total of 8mg .      warfarin 1 MG tablet   Commonly known as: COUMADIN   Take 7-8 mg by mouth daily. Alternating doses. Take 2 tablets (1mg ) along with a 5mg  tablet to make a total of 7mg  one day. The next day take 3 tablets (1mg ) along with 5mg  tablet to make a total dose of 8mg .            Discharge Instructions: Follow-up Information    Follow up with NEWMAN,DAVID H, MD. Schedule an appointment as soon as possible for a visit in 3 weeks.   Contact information:   [employer] [address] [place] [address] [phone]        D/C instructions on medication administration have been d/w patient  and written down on her d/c instruction section.  She is to take 12.5mg  of coumadin tonight and follow up tomorrow for INR check and coumadin adjustment from there.  She will give herself Lovenox shots as well.  She has been instructed on how to do this and is comfortable with it.  Signed: Yuchen Fedor E 09/18/2011, 10:30 AM

## 2011-09-18 NOTE — Discharge Instructions (Signed)
Give yourself Lovenox shots twice a day until Dr. Lenise Arena tells you that you can stop this. Take 12.5 mg of coumadin tonight.  See Dr. Lenise Arena lab visit tomorrow morning for INR check.  They will then tell you how to adjust your coumadin levels.  [facility] ______[facility] LAPAROSCOPIC SURGERY: POST OP INSTRUCTIONS Always review your discharge instruction sheet given to you by the facility where your surgery was performed. IF YOU HAVE DISABILITY OR FAMILY LEAVE FORMS, YOU MUST BRING THEM TO THE OFFICE FOR PROCESSING.   DO NOT GIVE THEM TO YOUR DOCTOR.  1. A prescription for pain medication may be given to you upon discharge.  Take your pain medication as prescribed, if needed.  If narcotic pain medicine is not needed, then you may take acetaminophen (Tylenol) or ibuprofen (Advil) as needed. 2. Take your usually prescribed medications unless otherwise directed. 3. If you need a refill on your pain medication, please contact your pharmacy.  They will contact our office to request authorization. Prescriptions will not be filled after 5pm or on week-ends. 4. You should follow a light diet the first few days after arrival home, such as soup and crackers, etc.  Be sure to include lots of fluids daily. 5. Most patients will experience some swelling and bruising in the area of the incisions.  Ice packs will help.  Swelling and bruising can take several days to resolve.  6. It is common to experience some constipation if taking pain medication after surgery.  Increasing fluid intake and taking a stool softener (such as Colace) will usually help or prevent this problem from occurring.  A mild laxative (Milk of Magnesia or Miralax) should be taken according to package instructions if there are no bowel movements after 48 hours. 7. Unless discharge instructions indicate otherwise, you may remove your bandages 24-48 hours after surgery, and you may shower at that time.  You may have steri-strips (small  skin tapes) in place directly over the incision.  These strips should be left on the skin for 7-10 days.  If your surgeon used skin glue on the incision, you may shower in 24 hours.  The glue will flake off over the next 2-3 weeks.  Any sutures or staples will be removed at the office during your follow-up visit. 8. ACTIVITIES:  You may resume regular (light) daily activities beginning the next day--such as daily self-care, walking, climbing stairs--gradually increasing activities as tolerated.  You may have sexual intercourse when it is comfortable.  Refrain from any heavy lifting or straining until approved by your doctor. a. You may drive when you are no longer taking prescription pain medication, you can comfortably wear a seatbelt, and you can safely maneuver your car and apply brakes. b. RETURN TO WORK:  __________________________________________________________ 9. You should see your doctor in the office for a follow-up appointment approximately 2-3 weeks after your surgery.  Make sure that you call for this appointment within a day or two after you arrive home to insure a convenient appointment time. 10. OTHER INSTRUCTIONS: __________________________________________________________________________________________________________________________ __________________________________________________________________________________________________________________________ WHEN TO CALL YOUR DOCTOR: 1. Fever over 101.0 2. Inability to urinate 3. Continued bleeding from incision. 4. Increased pain, redness, or drainage from the incision. 5. Increasing abdominal pain  The clinic staff is available to answer your questions during regular business hours.  Please don't hesitate to call and ask to speak to one of the nurses for clinical concerns.  If you have a medical emergency, go to the nearest emergency  room or call 911.  A surgeon from [facility] is always on call at the hospital. [address], [address], [place], [place]  [address] ? [address] [address], [place], [place]   [address] [phone] ? [phone] ? FAX [phone] Web site: [email or web address]

## 2011-09-18 NOTE — Discharge Summary (Signed)
Adarian Bur, MD, MPH, FACS Pager: [phone]  

## 2011-09-19 DIAGNOSIS — Z7901 Long term (current) use of anticoagulants: Secondary | ICD-10-CM | POA: Diagnosis not present

## 2011-09-19 DIAGNOSIS — I4891 Unspecified atrial fibrillation: Secondary | ICD-10-CM | POA: Diagnosis not present

## 2011-09-22 ENCOUNTER — Telehealth (INDEPENDENT_AMBULATORY_CARE_PROVIDER_SITE_OTHER): Payer: Self-pay | Admitting: Surgery

## 2011-09-22 DIAGNOSIS — I4891 Unspecified atrial fibrillation: Secondary | ICD-10-CM | POA: Diagnosis not present

## 2011-09-22 DIAGNOSIS — Z7901 Long term (current) use of anticoagulants: Secondary | ICD-10-CM | POA: Diagnosis not present

## 2011-09-23 NOTE — Telephone Encounter (Signed)
I called and gave the pt an appt for 5/9 at 10am

## 2011-09-26 DIAGNOSIS — Z7901 Long term (current) use of anticoagulants: Secondary | ICD-10-CM | POA: Diagnosis not present

## 2011-10-01 DIAGNOSIS — I4891 Unspecified atrial fibrillation: Secondary | ICD-10-CM | POA: Diagnosis not present

## 2011-10-01 DIAGNOSIS — Z7901 Long term (current) use of anticoagulants: Secondary | ICD-10-CM | POA: Diagnosis not present

## 2011-10-08 DIAGNOSIS — Z7901 Long term (current) use of anticoagulants: Secondary | ICD-10-CM | POA: Diagnosis not present

## 2011-10-16 ENCOUNTER — Ambulatory Visit (INDEPENDENT_AMBULATORY_CARE_PROVIDER_SITE_OTHER): Payer: Medicare Other | Admitting: Surgery

## 2011-10-16 ENCOUNTER — Encounter (INDEPENDENT_AMBULATORY_CARE_PROVIDER_SITE_OTHER): Payer: Self-pay | Admitting: Surgery

## 2011-10-16 VITALS — BP 130/88 | HR 68 | Temp 98.5°F | Resp 16 | Ht 70.0 in | Wt 263.0 lb

## 2011-10-16 DIAGNOSIS — K8020 Calculus of gallbladder without cholecystitis without obstruction: Secondary | ICD-10-CM

## 2011-10-16 DIAGNOSIS — K8050 Calculus of bile duct without cholangitis or cholecystitis without obstruction: Secondary | ICD-10-CM

## 2011-10-16 NOTE — Progress Notes (Signed)
[facility]  Ovidio Kin, MD,  FACS [address] [place]  [address] [place], [place]    [address] Phone:  [phone] FAX:  [phone]   Re:   Debbie Richards DOB:   1931-04-09 MRN:   [id number]  ASSESSMENT AND PLAN: 1.  S/P lap chole - 09/12/2011.  Has done well.  Return appt on PRN basis.  2.  Chronic anticoagulation for history of DVT/PE.   3.  GERD - improved since GB surgery 4.  She used to get recurrent ear infections with the GERD, this is now better.  HISTORY OF PRESENT ILLNESS: Chief Complaint  Patient presents with  . Routine Post Op    PO GB     Debbie Richards is a 78 y.o. (DOB: August 02, 1930)  white female who is a patient of MEYERS, STEPHEN, MD, MD and comes to me today for follow up of lap chole.  She presented acutely to [facility] hospital.  We had to work around her anticoagulation.  But she did well from surgery and has no complaints today.  In fact, her GERD is significantly better since the surgery.  PHYSICAL EXAM: BP 130/88  Pulse 68  Temp(Src) 98.5 F (36.9 C) (Temporal)  Resp 16  Ht 5\' 10"  (1.778 m)  Wt 263 lb (119.296 kg)  BMI 37.74 kg/m2  Abdomen:  Incisions well healed.  DATA REVIEWED: Path to patient.   Ovidio Kin, MD, FACS Office:  [phone]

## 2011-11-05 DIAGNOSIS — Z7901 Long term (current) use of anticoagulants: Secondary | ICD-10-CM | POA: Diagnosis not present

## 2011-11-14 DIAGNOSIS — Z7901 Long term (current) use of anticoagulants: Secondary | ICD-10-CM | POA: Diagnosis not present

## 2011-11-28 DIAGNOSIS — Z7901 Long term (current) use of anticoagulants: Secondary | ICD-10-CM | POA: Diagnosis not present

## 2011-12-26 DIAGNOSIS — Z1331 Encounter for screening for depression: Secondary | ICD-10-CM | POA: Diagnosis not present

## 2011-12-26 DIAGNOSIS — D485 Neoplasm of uncertain behavior of skin: Secondary | ICD-10-CM | POA: Diagnosis not present

## 2011-12-26 DIAGNOSIS — Z7901 Long term (current) use of anticoagulants: Secondary | ICD-10-CM | POA: Diagnosis not present

## 2011-12-26 DIAGNOSIS — I4891 Unspecified atrial fibrillation: Secondary | ICD-10-CM | POA: Diagnosis not present

## 2011-12-26 DIAGNOSIS — E7800 Pure hypercholesterolemia, unspecified: Secondary | ICD-10-CM | POA: Diagnosis not present

## 2012-01-02 DIAGNOSIS — Z7901 Long term (current) use of anticoagulants: Secondary | ICD-10-CM | POA: Diagnosis not present

## 2012-01-07 DIAGNOSIS — C44319 Basal cell carcinoma of skin of other parts of face: Secondary | ICD-10-CM | POA: Diagnosis not present

## 2012-01-15 DIAGNOSIS — Z7901 Long term (current) use of anticoagulants: Secondary | ICD-10-CM | POA: Diagnosis not present

## 2012-02-13 DIAGNOSIS — Z7901 Long term (current) use of anticoagulants: Secondary | ICD-10-CM | POA: Diagnosis not present

## 2012-02-18 DIAGNOSIS — D2330 Other benign neoplasm of skin of unspecified part of face: Secondary | ICD-10-CM | POA: Diagnosis not present

## 2012-02-18 DIAGNOSIS — Z85828 Personal history of other malignant neoplasm of skin: Secondary | ICD-10-CM | POA: Diagnosis not present

## 2012-02-18 DIAGNOSIS — L723 Sebaceous cyst: Secondary | ICD-10-CM | POA: Diagnosis not present

## 2012-03-12 DIAGNOSIS — Z7901 Long term (current) use of anticoagulants: Secondary | ICD-10-CM | POA: Diagnosis not present

## 2012-03-12 DIAGNOSIS — Z23 Encounter for immunization: Secondary | ICD-10-CM | POA: Diagnosis not present

## 2012-04-09 DIAGNOSIS — Z7901 Long term (current) use of anticoagulants: Secondary | ICD-10-CM | POA: Diagnosis not present

## 2012-05-05 DIAGNOSIS — Z7901 Long term (current) use of anticoagulants: Secondary | ICD-10-CM | POA: Diagnosis not present

## 2012-05-19 DIAGNOSIS — L259 Unspecified contact dermatitis, unspecified cause: Secondary | ICD-10-CM | POA: Diagnosis not present

## 2012-05-31 DIAGNOSIS — I4891 Unspecified atrial fibrillation: Secondary | ICD-10-CM | POA: Diagnosis not present

## 2012-05-31 DIAGNOSIS — E7800 Pure hypercholesterolemia, unspecified: Secondary | ICD-10-CM | POA: Diagnosis not present

## 2012-06-07 DIAGNOSIS — H7290 Unspecified perforation of tympanic membrane, unspecified ear: Secondary | ICD-10-CM | POA: Diagnosis not present

## 2012-06-07 DIAGNOSIS — H60399 Other infective otitis externa, unspecified ear: Secondary | ICD-10-CM | POA: Diagnosis not present

## 2012-06-18 ENCOUNTER — Telehealth (INDEPENDENT_AMBULATORY_CARE_PROVIDER_SITE_OTHER): Payer: Self-pay | Admitting: General Surgery

## 2012-06-18 NOTE — Telephone Encounter (Signed)
Pt of Dr. Ezzard Standing had GB surgery in the remote past.  She called her PCP this afternoon, complaining of diarrhea and told them she has had it since her surgery.  They advised her to call the surgeon.  Pt describes soft-formed stool, sometimes liquid with mucous.  Pt has not tried OTC Immodium.  Suggested she try this first; if that is not effective, make appt with GE specialist (mentioned [facility].)  She understands and will try this.

## 2012-06-28 DIAGNOSIS — I4891 Unspecified atrial fibrillation: Secondary | ICD-10-CM | POA: Diagnosis not present

## 2012-07-12 DIAGNOSIS — H04129 Dry eye syndrome of unspecified lacrimal gland: Secondary | ICD-10-CM | POA: Diagnosis not present

## 2012-08-02 DIAGNOSIS — I4891 Unspecified atrial fibrillation: Secondary | ICD-10-CM | POA: Diagnosis not present

## 2012-08-05 DIAGNOSIS — M25539 Pain in unspecified wrist: Secondary | ICD-10-CM | POA: Diagnosis not present

## 2012-08-05 DIAGNOSIS — S59909A Unspecified injury of unspecified elbow, initial encounter: Secondary | ICD-10-CM | POA: Diagnosis not present

## 2012-08-09 DIAGNOSIS — Z7901 Long term (current) use of anticoagulants: Secondary | ICD-10-CM | POA: Diagnosis not present

## 2012-08-09 DIAGNOSIS — S63509A Unspecified sprain of unspecified wrist, initial encounter: Secondary | ICD-10-CM | POA: Diagnosis not present

## 2012-09-06 DIAGNOSIS — Z7901 Long term (current) use of anticoagulants: Secondary | ICD-10-CM | POA: Diagnosis not present

## 2012-09-13 DIAGNOSIS — I4891 Unspecified atrial fibrillation: Secondary | ICD-10-CM | POA: Diagnosis not present

## 2012-09-20 DIAGNOSIS — Z7901 Long term (current) use of anticoagulants: Secondary | ICD-10-CM | POA: Diagnosis not present

## 2012-09-23 DIAGNOSIS — H7290 Unspecified perforation of tympanic membrane, unspecified ear: Secondary | ICD-10-CM | POA: Diagnosis not present

## 2012-09-23 DIAGNOSIS — H6980 Other specified disorders of Eustachian tube, unspecified ear: Secondary | ICD-10-CM | POA: Diagnosis not present

## 2012-10-04 DIAGNOSIS — I4891 Unspecified atrial fibrillation: Secondary | ICD-10-CM | POA: Diagnosis not present

## 2012-10-04 DIAGNOSIS — Z7901 Long term (current) use of anticoagulants: Secondary | ICD-10-CM | POA: Diagnosis not present

## 2012-10-18 DIAGNOSIS — Z7901 Long term (current) use of anticoagulants: Secondary | ICD-10-CM | POA: Diagnosis not present

## 2012-10-25 DIAGNOSIS — Z7901 Long term (current) use of anticoagulants: Secondary | ICD-10-CM | POA: Diagnosis not present

## 2012-11-08 DIAGNOSIS — I4891 Unspecified atrial fibrillation: Secondary | ICD-10-CM | POA: Diagnosis not present

## 2012-11-08 DIAGNOSIS — Z7901 Long term (current) use of anticoagulants: Secondary | ICD-10-CM | POA: Diagnosis not present

## 2012-11-17 DIAGNOSIS — D2330 Other benign neoplasm of skin of unspecified part of face: Secondary | ICD-10-CM | POA: Diagnosis not present

## 2012-11-17 DIAGNOSIS — L820 Inflamed seborrheic keratosis: Secondary | ICD-10-CM | POA: Diagnosis not present

## 2012-11-22 DIAGNOSIS — I4891 Unspecified atrial fibrillation: Secondary | ICD-10-CM | POA: Diagnosis not present

## 2012-12-03 DIAGNOSIS — Z7901 Long term (current) use of anticoagulants: Secondary | ICD-10-CM | POA: Diagnosis not present

## 2012-12-24 DIAGNOSIS — Z7901 Long term (current) use of anticoagulants: Secondary | ICD-10-CM | POA: Diagnosis not present

## 2013-01-07 DIAGNOSIS — Z7901 Long term (current) use of anticoagulants: Secondary | ICD-10-CM | POA: Diagnosis not present

## 2013-01-19 DIAGNOSIS — Z1331 Encounter for screening for depression: Secondary | ICD-10-CM | POA: Diagnosis not present

## 2013-01-19 DIAGNOSIS — K219 Gastro-esophageal reflux disease without esophagitis: Secondary | ICD-10-CM | POA: Diagnosis not present

## 2013-01-19 DIAGNOSIS — R1310 Dysphagia, unspecified: Secondary | ICD-10-CM | POA: Diagnosis not present

## 2013-01-19 DIAGNOSIS — E049 Nontoxic goiter, unspecified: Secondary | ICD-10-CM | POA: Diagnosis not present

## 2013-01-19 DIAGNOSIS — E7800 Pure hypercholesterolemia, unspecified: Secondary | ICD-10-CM | POA: Diagnosis not present

## 2013-01-19 DIAGNOSIS — I10 Essential (primary) hypertension: Secondary | ICD-10-CM | POA: Diagnosis not present

## 2013-01-25 ENCOUNTER — Other Ambulatory Visit: Payer: Self-pay | Admitting: Family Medicine

## 2013-01-25 DIAGNOSIS — R1310 Dysphagia, unspecified: Secondary | ICD-10-CM

## 2013-01-28 ENCOUNTER — Ambulatory Visit
Admission: RE | Admit: 2013-01-28 | Discharge: 2013-01-28 | Disposition: A | Discharge status: Home / Self Care | Payer: Medicare Other | Source: Ambulatory Visit | Attending: Family Medicine | Admitting: Family Medicine

## 2013-01-28 DIAGNOSIS — R1310 Dysphagia, unspecified: Secondary | ICD-10-CM

## 2013-01-28 DIAGNOSIS — R6889 Other general symptoms and signs: Secondary | ICD-10-CM | POA: Diagnosis not present

## 2013-02-04 DIAGNOSIS — G479 Sleep disorder, unspecified: Secondary | ICD-10-CM | POA: Diagnosis not present

## 2013-02-04 DIAGNOSIS — Z86718 Personal history of other venous thrombosis and embolism: Secondary | ICD-10-CM | POA: Diagnosis not present

## 2013-02-04 DIAGNOSIS — E7800 Pure hypercholesterolemia, unspecified: Secondary | ICD-10-CM | POA: Diagnosis not present

## 2013-02-04 DIAGNOSIS — T17308A Unspecified foreign body in larynx causing other injury, initial encounter: Secondary | ICD-10-CM | POA: Diagnosis not present

## 2013-02-04 DIAGNOSIS — Z7901 Long term (current) use of anticoagulants: Secondary | ICD-10-CM | POA: Diagnosis not present

## 2013-02-04 DIAGNOSIS — I10 Essential (primary) hypertension: Secondary | ICD-10-CM | POA: Diagnosis not present

## 2013-03-04 DIAGNOSIS — Z7901 Long term (current) use of anticoagulants: Secondary | ICD-10-CM | POA: Diagnosis not present

## 2013-03-04 DIAGNOSIS — Z23 Encounter for immunization: Secondary | ICD-10-CM | POA: Diagnosis not present

## 2013-03-23 IMAGING — CR DG CHEST 2V
1 series · 1 of 1 positions shown · non-contrast
Comparison: Report from study 06/27/2003

CLINICAL DATA: Shortness of breath, nausea, vomiting.

CHEST - 2 VIEW

[w chest lat]
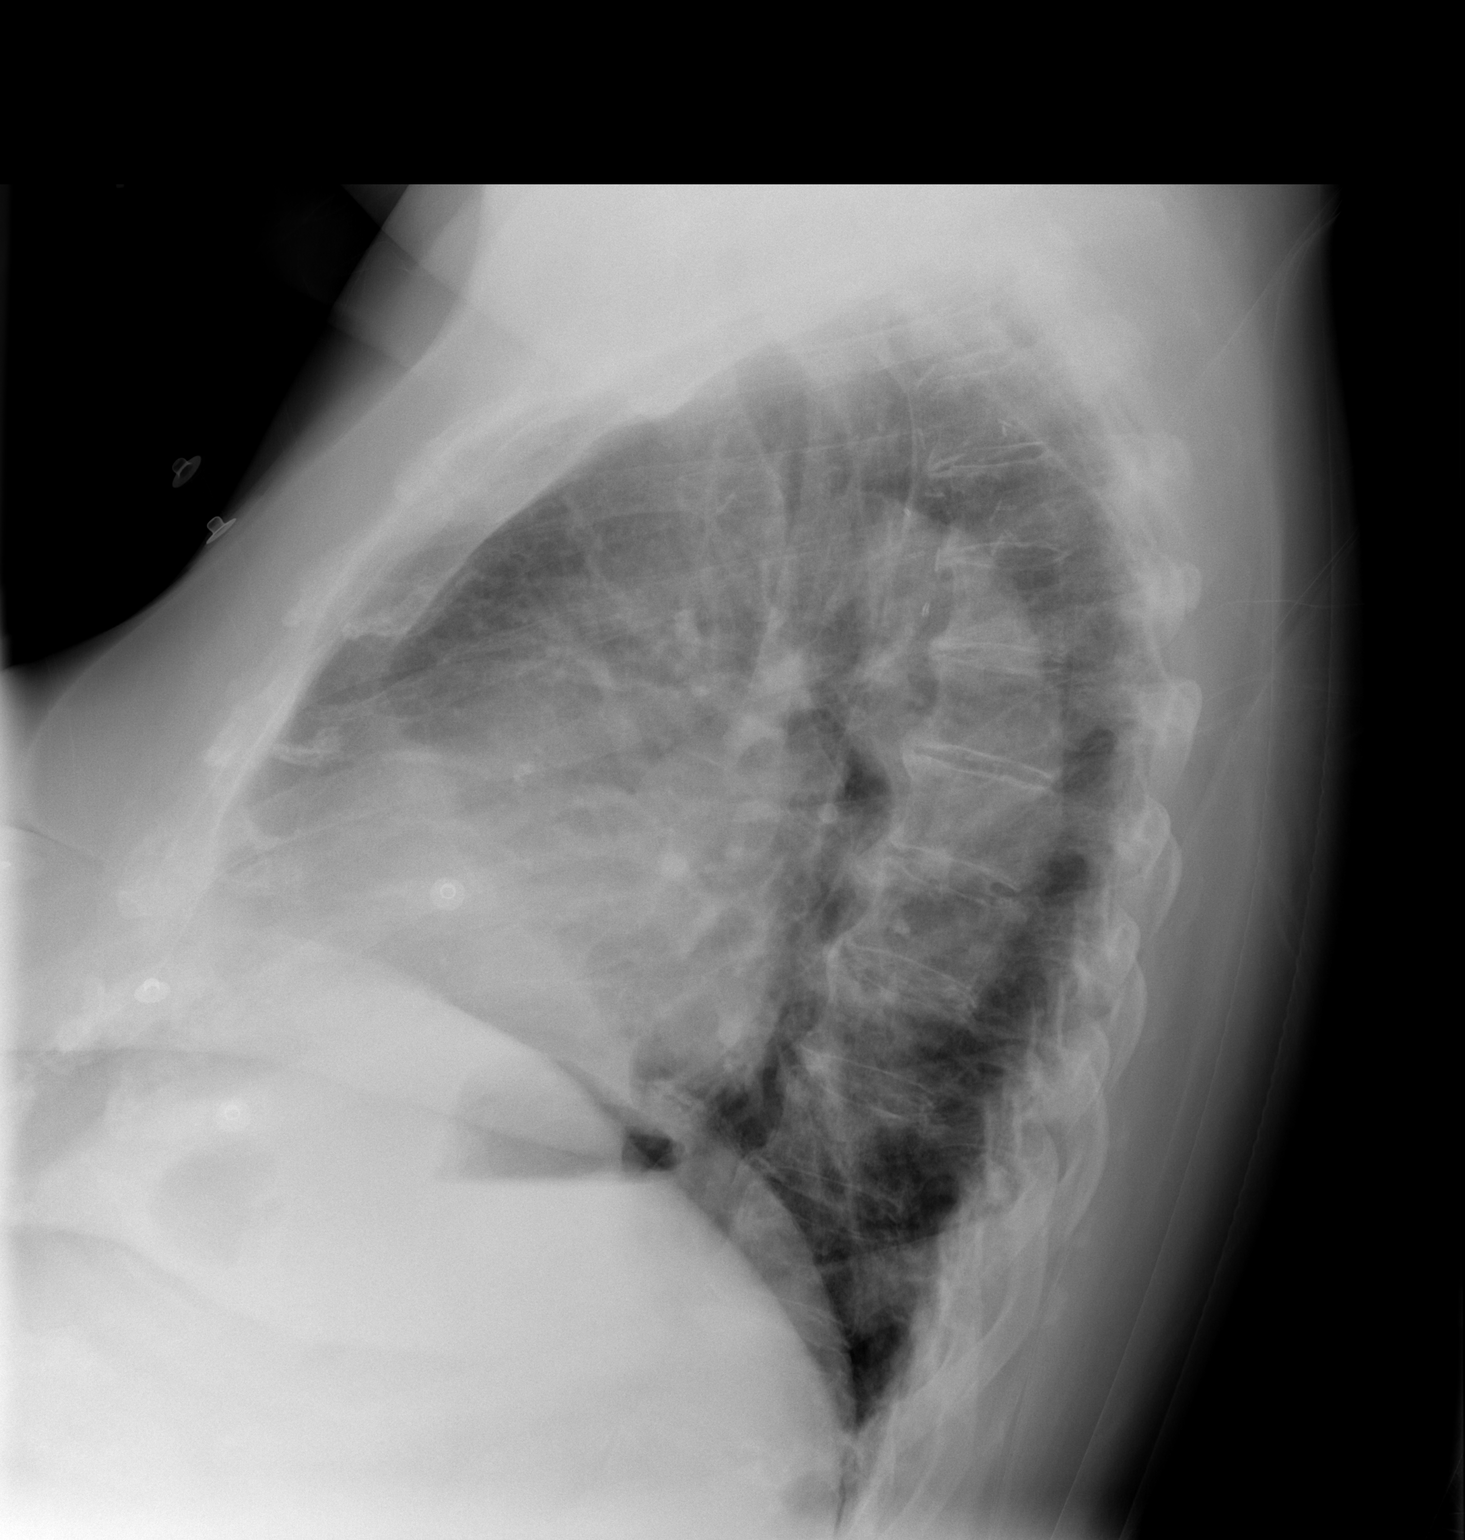

[1 of 1 positions shown; findings below may reference images not displayed]

FINDINGS: Mild cardiomegaly.  No confluent airspace opacities,
effusions or edema.  Degenerative changes in the thoracic spine.
IMPRESSION: Mild cardiomegaly.  No active disease.

## 2013-03-26 IMAGING — RF DG CHOLANGIOGRAM OPERATIVE
1 series · 7 of 7 positions shown · non-contrast
Comparison: None

CLINICAL DATA: Cholelithiasis

INTRAOPERATIVE CHOLANGIOGRAM
TECHNIQUE: Cholangiographic images from the C-arm fluoroscopic
device were submitted for interpretation post-operatively.  Please
see the procedural report for the amount of contrast and the
fluoroscopy time utilized.

[Series 1: run · 2 acquisitions, 7 frames shown]
[im 1/2]
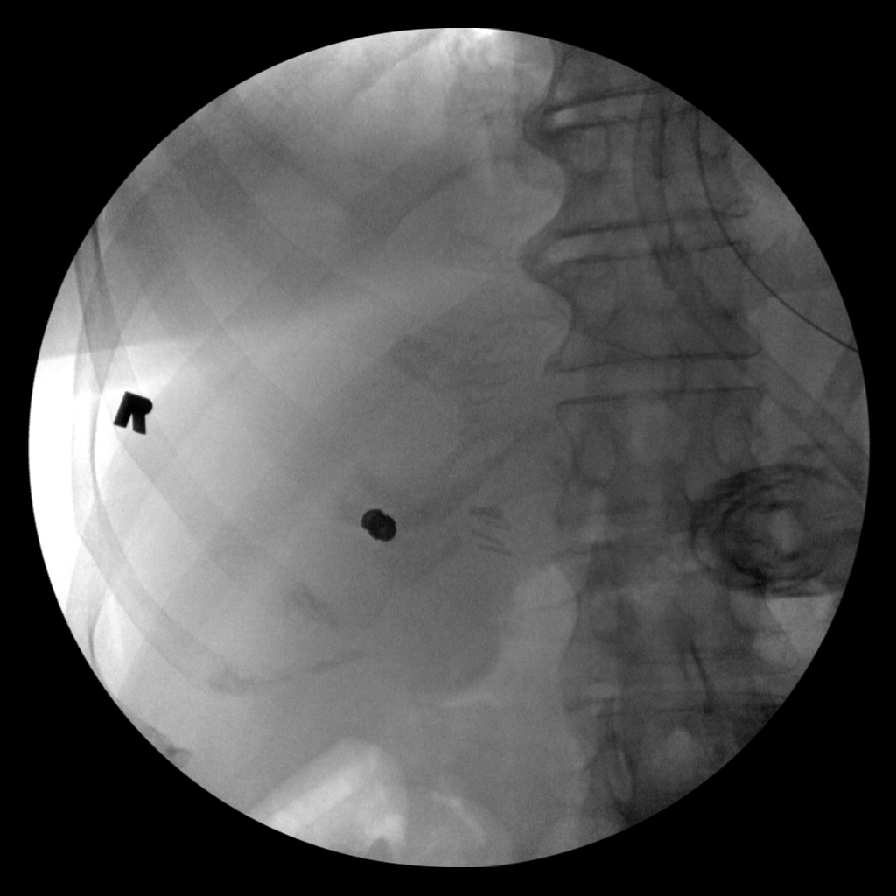
[im 1/2]
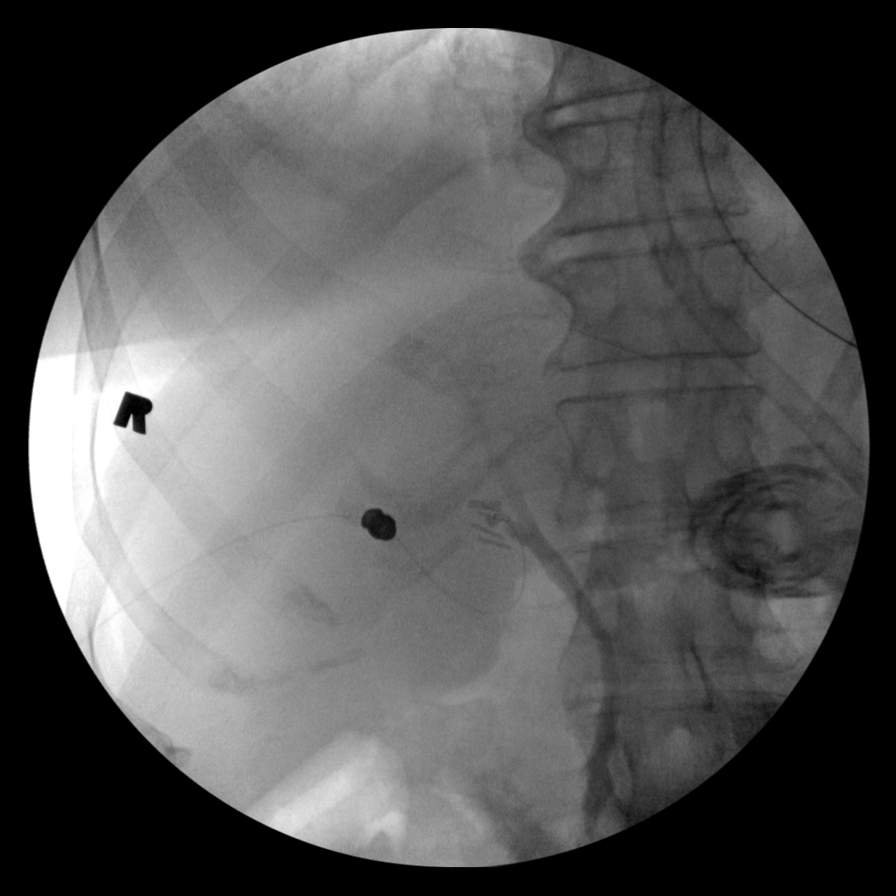
[im 1/2]
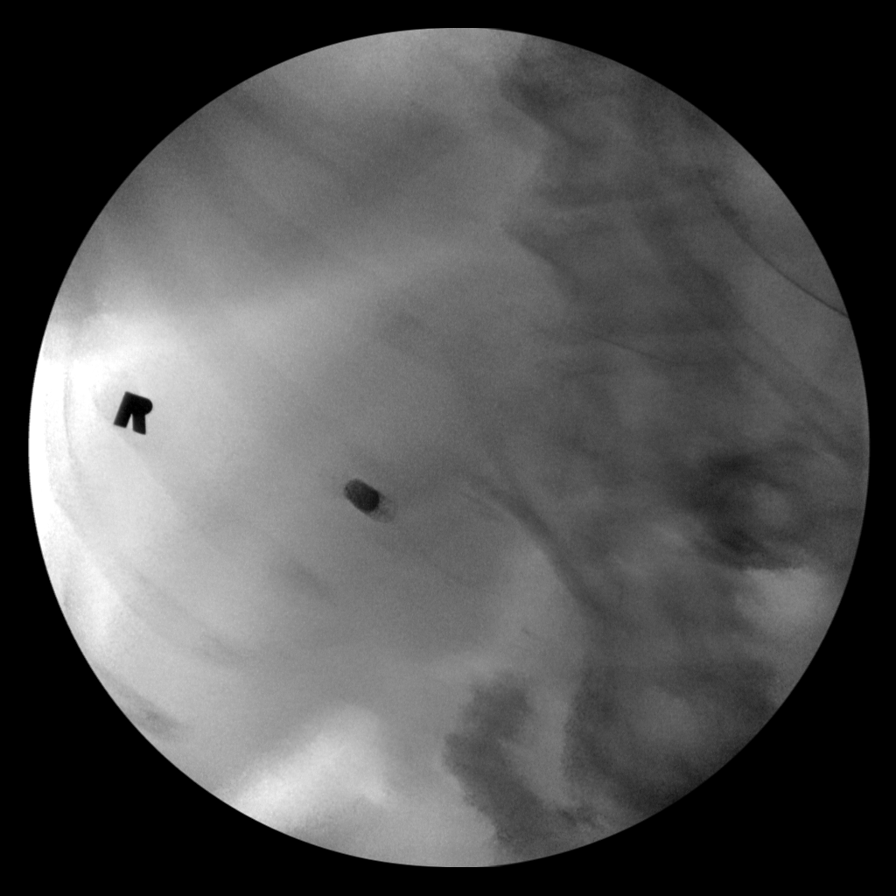
[im 1/2]
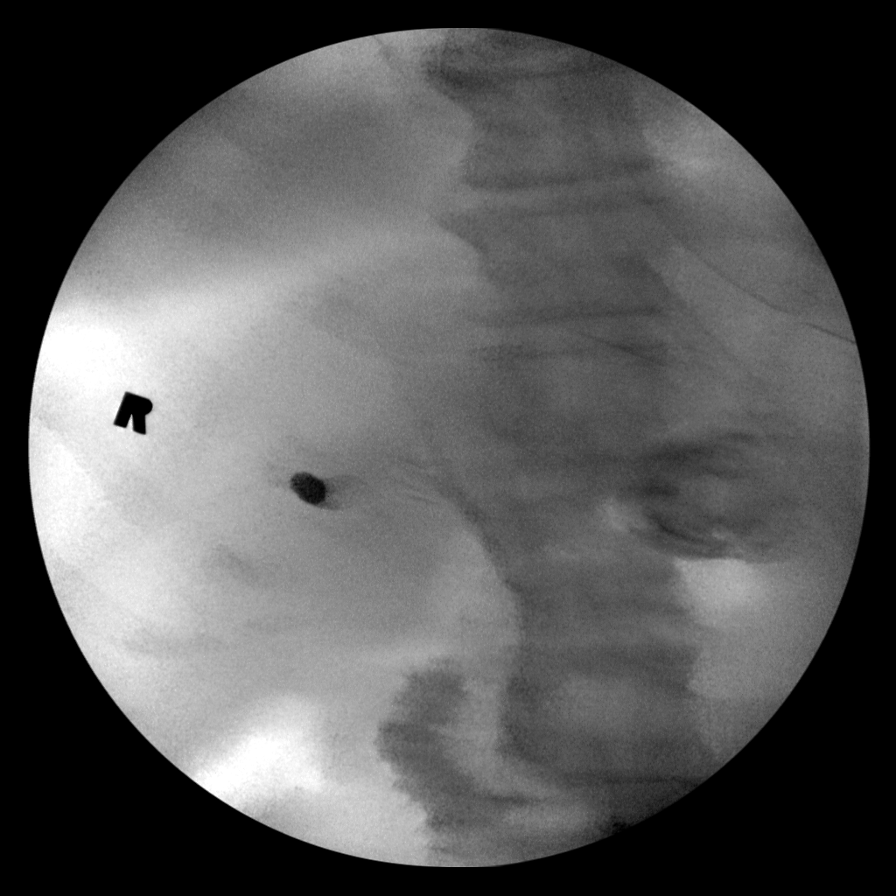
[im 2/2]
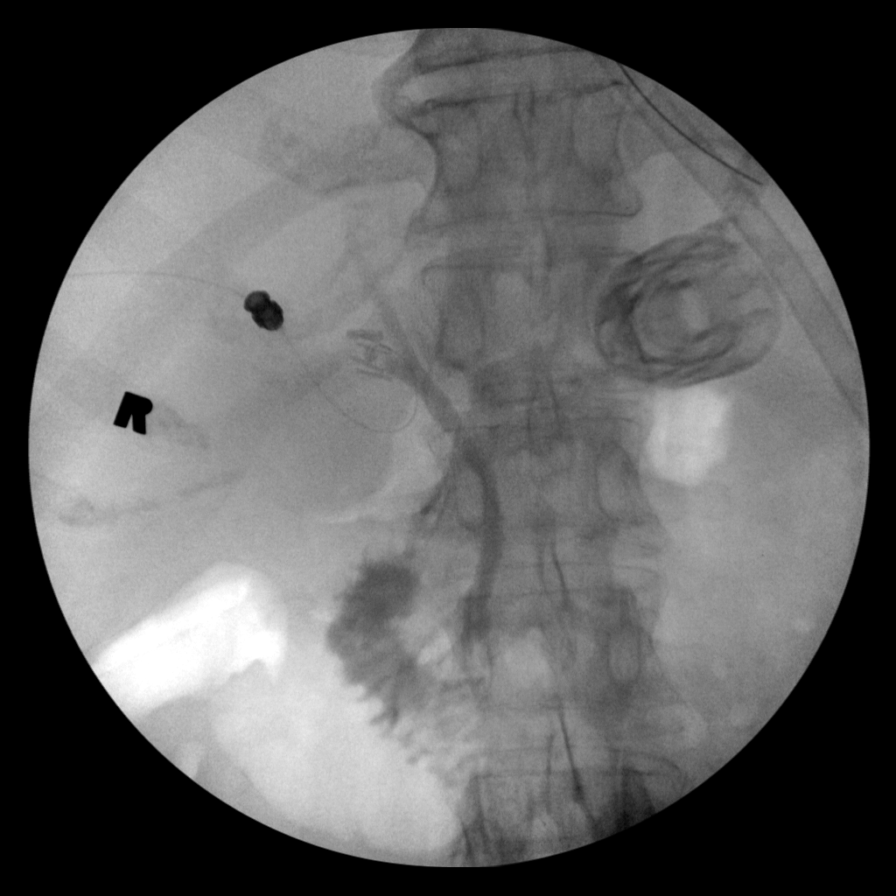
[im 2/2]
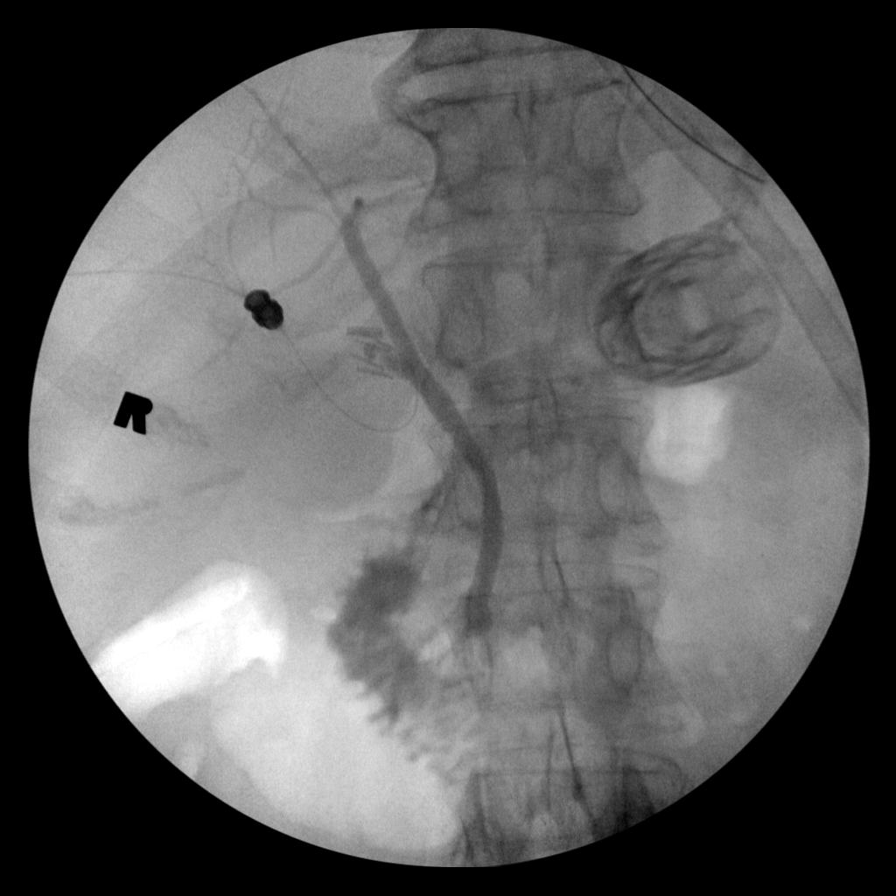
[im 2/2]
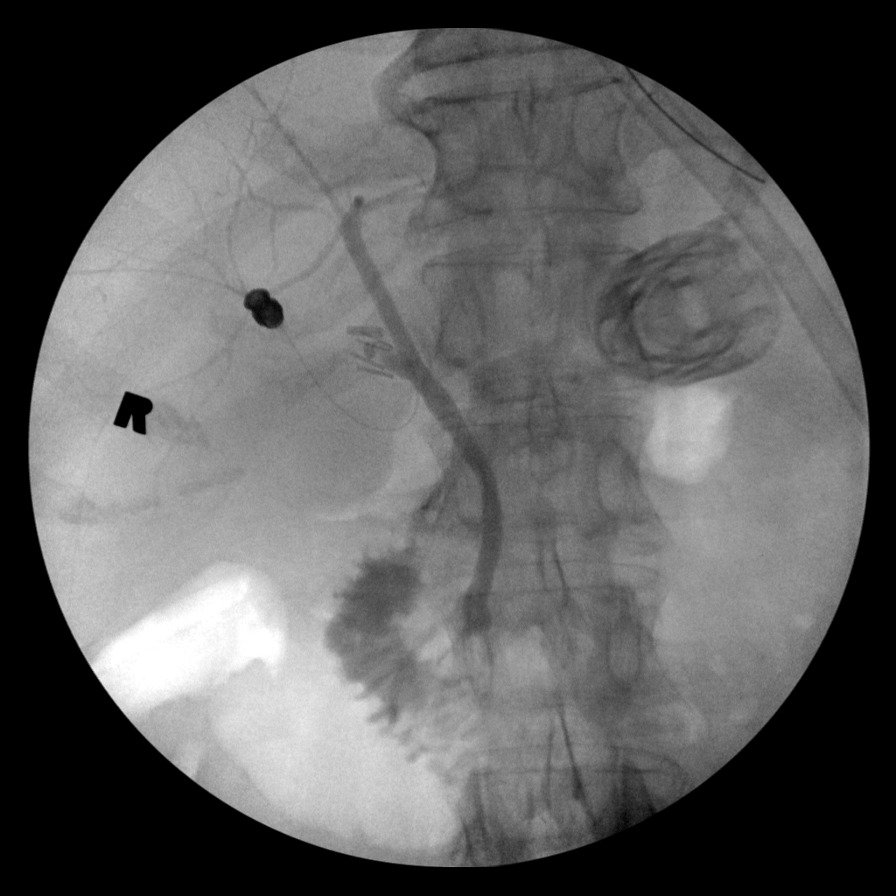

[7 of 7 positions shown; findings below may reference images not displayed]

FINDINGS: No persistent filling defects in the common duct.
Intrahepatic ducts are incompletely visualized, appearing
decompressed centrally. Contrast passes into the duodenum.

IMPRESSION

Negative for retained common duct stone.

## 2013-04-01 DIAGNOSIS — E049 Nontoxic goiter, unspecified: Secondary | ICD-10-CM | POA: Diagnosis not present

## 2013-04-01 DIAGNOSIS — E7800 Pure hypercholesterolemia, unspecified: Secondary | ICD-10-CM | POA: Diagnosis not present

## 2013-04-01 DIAGNOSIS — Z7901 Long term (current) use of anticoagulants: Secondary | ICD-10-CM | POA: Diagnosis not present

## 2013-04-01 DIAGNOSIS — I82409 Acute embolism and thrombosis of unspecified deep veins of unspecified lower extremity: Secondary | ICD-10-CM | POA: Diagnosis not present

## 2013-04-12 DIAGNOSIS — H60399 Other infective otitis externa, unspecified ear: Secondary | ICD-10-CM | POA: Diagnosis not present

## 2013-04-12 DIAGNOSIS — H7290 Unspecified perforation of tympanic membrane, unspecified ear: Secondary | ICD-10-CM | POA: Diagnosis not present

## 2013-05-02 DIAGNOSIS — Z7901 Long term (current) use of anticoagulants: Secondary | ICD-10-CM | POA: Diagnosis not present

## 2013-05-02 DIAGNOSIS — I2699 Other pulmonary embolism without acute cor pulmonale: Secondary | ICD-10-CM | POA: Diagnosis not present

## 2013-05-10 DIAGNOSIS — R350 Frequency of micturition: Secondary | ICD-10-CM | POA: Diagnosis not present

## 2013-05-10 DIAGNOSIS — N390 Urinary tract infection, site not specified: Secondary | ICD-10-CM | POA: Diagnosis not present

## 2013-05-10 DIAGNOSIS — Z7901 Long term (current) use of anticoagulants: Secondary | ICD-10-CM | POA: Diagnosis not present

## 2013-05-10 DIAGNOSIS — R310 Gross hematuria: Secondary | ICD-10-CM | POA: Diagnosis not present

## 2013-05-10 DIAGNOSIS — Z86711 Personal history of pulmonary embolism: Secondary | ICD-10-CM | POA: Diagnosis not present

## 2013-05-25 DIAGNOSIS — D485 Neoplasm of uncertain behavior of skin: Secondary | ICD-10-CM | POA: Diagnosis not present

## 2013-05-25 DIAGNOSIS — L821 Other seborrheic keratosis: Secondary | ICD-10-CM | POA: Diagnosis not present

## 2013-05-25 DIAGNOSIS — Z85828 Personal history of other malignant neoplasm of skin: Secondary | ICD-10-CM | POA: Diagnosis not present

## 2013-05-30 DIAGNOSIS — Z7901 Long term (current) use of anticoagulants: Secondary | ICD-10-CM | POA: Diagnosis not present

## 2013-05-30 DIAGNOSIS — I2699 Other pulmonary embolism without acute cor pulmonale: Secondary | ICD-10-CM | POA: Diagnosis not present

## 2013-06-27 DIAGNOSIS — Z7901 Long term (current) use of anticoagulants: Secondary | ICD-10-CM | POA: Diagnosis not present

## 2013-06-27 DIAGNOSIS — I4891 Unspecified atrial fibrillation: Secondary | ICD-10-CM | POA: Diagnosis not present

## 2013-07-05 DIAGNOSIS — H60399 Other infective otitis externa, unspecified ear: Secondary | ICD-10-CM | POA: Diagnosis not present

## 2013-07-05 DIAGNOSIS — H7290 Unspecified perforation of tympanic membrane, unspecified ear: Secondary | ICD-10-CM | POA: Diagnosis not present

## 2013-07-11 DIAGNOSIS — I82409 Acute embolism and thrombosis of unspecified deep veins of unspecified lower extremity: Secondary | ICD-10-CM | POA: Diagnosis not present

## 2013-07-11 DIAGNOSIS — Z7901 Long term (current) use of anticoagulants: Secondary | ICD-10-CM | POA: Diagnosis not present

## 2013-07-12 DIAGNOSIS — H60399 Other infective otitis externa, unspecified ear: Secondary | ICD-10-CM | POA: Diagnosis not present

## 2013-08-09 DIAGNOSIS — I4891 Unspecified atrial fibrillation: Secondary | ICD-10-CM | POA: Diagnosis not present

## 2013-08-09 DIAGNOSIS — Z7901 Long term (current) use of anticoagulants: Secondary | ICD-10-CM | POA: Diagnosis not present

## 2013-08-16 DIAGNOSIS — I4891 Unspecified atrial fibrillation: Secondary | ICD-10-CM | POA: Diagnosis not present

## 2013-08-16 DIAGNOSIS — Z7901 Long term (current) use of anticoagulants: Secondary | ICD-10-CM | POA: Diagnosis not present

## 2013-09-14 DIAGNOSIS — I82409 Acute embolism and thrombosis of unspecified deep veins of unspecified lower extremity: Secondary | ICD-10-CM | POA: Diagnosis not present

## 2013-09-14 DIAGNOSIS — Z7901 Long term (current) use of anticoagulants: Secondary | ICD-10-CM | POA: Diagnosis not present

## 2013-09-19 DIAGNOSIS — H6120 Impacted cerumen, unspecified ear: Secondary | ICD-10-CM | POA: Diagnosis not present

## 2013-10-11 DIAGNOSIS — H60399 Other infective otitis externa, unspecified ear: Secondary | ICD-10-CM | POA: Diagnosis not present

## 2013-10-11 DIAGNOSIS — H7290 Unspecified perforation of tympanic membrane, unspecified ear: Secondary | ICD-10-CM | POA: Diagnosis not present

## 2013-10-12 DIAGNOSIS — M041 Periodic fever syndromes: Secondary | ICD-10-CM | POA: Diagnosis not present

## 2013-10-12 DIAGNOSIS — Z7901 Long term (current) use of anticoagulants: Secondary | ICD-10-CM | POA: Diagnosis not present

## 2013-10-12 DIAGNOSIS — E7800 Pure hypercholesterolemia, unspecified: Secondary | ICD-10-CM | POA: Diagnosis not present

## 2013-10-12 DIAGNOSIS — E039 Hypothyroidism, unspecified: Secondary | ICD-10-CM | POA: Diagnosis not present

## 2013-10-12 DIAGNOSIS — I4891 Unspecified atrial fibrillation: Secondary | ICD-10-CM | POA: Diagnosis not present

## 2013-10-12 DIAGNOSIS — Z23 Encounter for immunization: Secondary | ICD-10-CM | POA: Diagnosis not present

## 2013-10-12 DIAGNOSIS — K219 Gastro-esophageal reflux disease without esophagitis: Secondary | ICD-10-CM | POA: Diagnosis not present

## 2013-10-12 DIAGNOSIS — I10 Essential (primary) hypertension: Secondary | ICD-10-CM | POA: Diagnosis not present

## 2013-10-25 DIAGNOSIS — H60399 Other infective otitis externa, unspecified ear: Secondary | ICD-10-CM | POA: Diagnosis not present

## 2013-11-03 DIAGNOSIS — Z780 Asymptomatic menopausal state: Secondary | ICD-10-CM | POA: Diagnosis not present

## 2013-11-07 DIAGNOSIS — H60399 Other infective otitis externa, unspecified ear: Secondary | ICD-10-CM | POA: Diagnosis not present

## 2013-11-07 DIAGNOSIS — H7290 Unspecified perforation of tympanic membrane, unspecified ear: Secondary | ICD-10-CM | POA: Diagnosis not present

## 2013-11-09 DIAGNOSIS — Z7901 Long term (current) use of anticoagulants: Secondary | ICD-10-CM | POA: Diagnosis not present

## 2013-11-09 DIAGNOSIS — I82409 Acute embolism and thrombosis of unspecified deep veins of unspecified lower extremity: Secondary | ICD-10-CM | POA: Diagnosis not present

## 2013-11-16 DIAGNOSIS — H60399 Other infective otitis externa, unspecified ear: Secondary | ICD-10-CM | POA: Diagnosis not present

## 2013-12-08 DIAGNOSIS — Z7901 Long term (current) use of anticoagulants: Secondary | ICD-10-CM | POA: Diagnosis not present

## 2013-12-08 DIAGNOSIS — I82409 Acute embolism and thrombosis of unspecified deep veins of unspecified lower extremity: Secondary | ICD-10-CM | POA: Diagnosis not present

## 2013-12-22 DIAGNOSIS — H7290 Unspecified perforation of tympanic membrane, unspecified ear: Secondary | ICD-10-CM | POA: Diagnosis not present

## 2013-12-22 DIAGNOSIS — H6120 Impacted cerumen, unspecified ear: Secondary | ICD-10-CM | POA: Diagnosis not present

## 2013-12-22 DIAGNOSIS — H60399 Other infective otitis externa, unspecified ear: Secondary | ICD-10-CM | POA: Diagnosis not present

## 2014-01-05 DIAGNOSIS — Z7901 Long term (current) use of anticoagulants: Secondary | ICD-10-CM | POA: Diagnosis not present

## 2014-01-05 DIAGNOSIS — E0590 Thyrotoxicosis, unspecified without thyrotoxic crisis or storm: Secondary | ICD-10-CM | POA: Diagnosis not present

## 2014-01-11 DIAGNOSIS — Z961 Presence of intraocular lens: Secondary | ICD-10-CM | POA: Diagnosis not present

## 2014-01-24 DIAGNOSIS — H60399 Other infective otitis externa, unspecified ear: Secondary | ICD-10-CM | POA: Diagnosis not present

## 2014-01-31 DIAGNOSIS — H60399 Other infective otitis externa, unspecified ear: Secondary | ICD-10-CM | POA: Diagnosis not present

## 2014-02-02 DIAGNOSIS — I82409 Acute embolism and thrombosis of unspecified deep veins of unspecified lower extremity: Secondary | ICD-10-CM | POA: Diagnosis not present

## 2014-02-02 DIAGNOSIS — Z7901 Long term (current) use of anticoagulants: Secondary | ICD-10-CM | POA: Diagnosis not present

## 2014-02-09 DIAGNOSIS — Z7901 Long term (current) use of anticoagulants: Secondary | ICD-10-CM | POA: Diagnosis not present

## 2014-02-09 DIAGNOSIS — I82409 Acute embolism and thrombosis of unspecified deep veins of unspecified lower extremity: Secondary | ICD-10-CM | POA: Diagnosis not present

## 2014-02-23 DIAGNOSIS — Z7901 Long term (current) use of anticoagulants: Secondary | ICD-10-CM | POA: Diagnosis not present

## 2014-02-23 DIAGNOSIS — I4891 Unspecified atrial fibrillation: Secondary | ICD-10-CM | POA: Diagnosis not present

## 2014-03-21 DIAGNOSIS — H628X1 Other disorders of right external ear in diseases classified elsewhere: Secondary | ICD-10-CM | POA: Diagnosis not present

## 2014-03-21 DIAGNOSIS — H7290 Unspecified perforation of tympanic membrane, unspecified ear: Secondary | ICD-10-CM | POA: Diagnosis not present

## 2014-03-23 DIAGNOSIS — Z7901 Long term (current) use of anticoagulants: Secondary | ICD-10-CM | POA: Diagnosis not present

## 2014-03-23 DIAGNOSIS — I4891 Unspecified atrial fibrillation: Secondary | ICD-10-CM | POA: Diagnosis not present

## 2014-03-24 DIAGNOSIS — I10 Essential (primary) hypertension: Secondary | ICD-10-CM | POA: Diagnosis not present

## 2014-03-24 DIAGNOSIS — K5900 Constipation, unspecified: Secondary | ICD-10-CM | POA: Diagnosis not present

## 2014-03-24 DIAGNOSIS — E780 Pure hypercholesterolemia: Secondary | ICD-10-CM | POA: Diagnosis not present

## 2014-03-24 DIAGNOSIS — I482 Chronic atrial fibrillation: Secondary | ICD-10-CM | POA: Diagnosis not present

## 2014-03-24 DIAGNOSIS — E039 Hypothyroidism, unspecified: Secondary | ICD-10-CM | POA: Diagnosis not present

## 2014-03-28 DIAGNOSIS — H60399 Other infective otitis externa, unspecified ear: Secondary | ICD-10-CM | POA: Diagnosis not present

## 2014-04-20 DIAGNOSIS — E780 Pure hypercholesterolemia: Secondary | ICD-10-CM | POA: Diagnosis not present

## 2014-04-20 DIAGNOSIS — I10 Essential (primary) hypertension: Secondary | ICD-10-CM | POA: Diagnosis not present

## 2014-04-20 DIAGNOSIS — Z7901 Long term (current) use of anticoagulants: Secondary | ICD-10-CM | POA: Diagnosis not present

## 2014-04-20 DIAGNOSIS — Z23 Encounter for immunization: Secondary | ICD-10-CM | POA: Diagnosis not present

## 2014-04-20 DIAGNOSIS — E039 Hypothyroidism, unspecified: Secondary | ICD-10-CM | POA: Diagnosis not present

## 2014-04-20 DIAGNOSIS — I482 Chronic atrial fibrillation: Secondary | ICD-10-CM | POA: Diagnosis not present

## 2014-04-21 DIAGNOSIS — H60399 Other infective otitis externa, unspecified ear: Secondary | ICD-10-CM | POA: Diagnosis not present

## 2014-04-21 DIAGNOSIS — H7290 Unspecified perforation of tympanic membrane, unspecified ear: Secondary | ICD-10-CM | POA: Diagnosis not present

## 2014-05-18 DIAGNOSIS — I4891 Unspecified atrial fibrillation: Secondary | ICD-10-CM | POA: Diagnosis not present

## 2014-05-18 DIAGNOSIS — Z7901 Long term (current) use of anticoagulants: Secondary | ICD-10-CM | POA: Diagnosis not present

## 2014-06-15 DIAGNOSIS — Z7901 Long term (current) use of anticoagulants: Secondary | ICD-10-CM | POA: Diagnosis not present

## 2014-06-15 DIAGNOSIS — I4891 Unspecified atrial fibrillation: Secondary | ICD-10-CM | POA: Diagnosis not present

## 2014-07-13 DIAGNOSIS — Z7901 Long term (current) use of anticoagulants: Secondary | ICD-10-CM | POA: Diagnosis not present

## 2014-07-13 DIAGNOSIS — I4891 Unspecified atrial fibrillation: Secondary | ICD-10-CM | POA: Diagnosis not present

## 2014-08-10 DIAGNOSIS — R262 Difficulty in walking, not elsewhere classified: Secondary | ICD-10-CM | POA: Diagnosis not present

## 2014-08-10 DIAGNOSIS — Z7901 Long term (current) use of anticoagulants: Secondary | ICD-10-CM | POA: Diagnosis not present

## 2014-08-10 DIAGNOSIS — Z86718 Personal history of other venous thrombosis and embolism: Secondary | ICD-10-CM | POA: Diagnosis not present

## 2014-08-12 IMAGING — RF DG ESOPHAGUS
19 of 24 series · 19 of 24 positions shown · non-contrast
Comparison: None.

FLUOROSCOPY TIME:  2 min, 0 seconds.

CLINICAL DATA: Choking, coughing with water.

EXAM:
ESOPHOGRAM/BARIUM SWALLOW
TECHNIQUE: Combined double contrast and single contrast examination performed
using effervescent crystals, thick barium liquid, and thin barium
liquid.

[Series 1: run · 1 of 6 slices shown (1 of 19)]
[im 1/6]
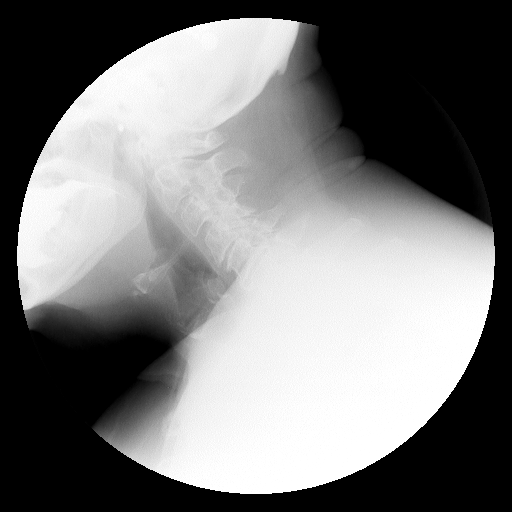

[Series 2: run · 1 of 6 slices shown (2 of 19)]
[im 1/6]
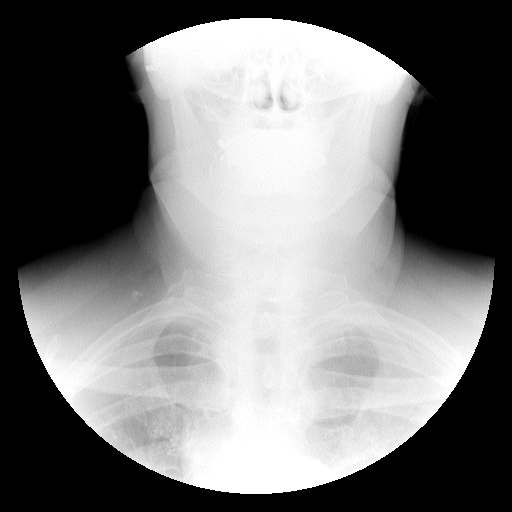

[Series 4: run · 1 of 3 slices shown (3 of 19)]
[im 1/3]
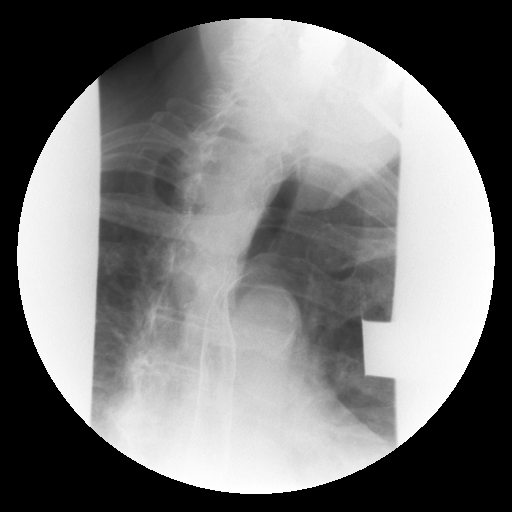

[Series 5: run · 1 of 1 slices shown (4 of 19)]
[im 1/1]
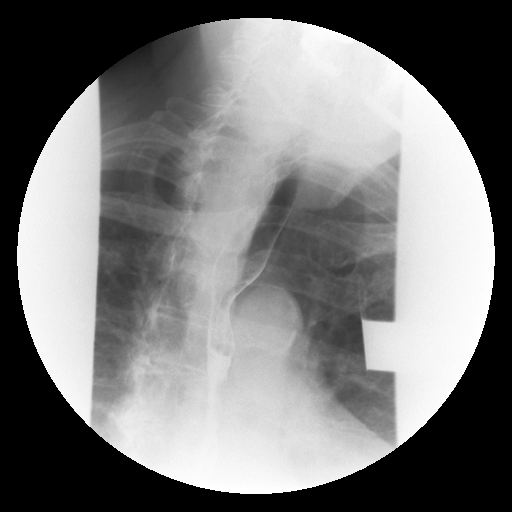

[Series 6: run · 1 of 1 slices shown (5 of 19)]
[im 1/1]
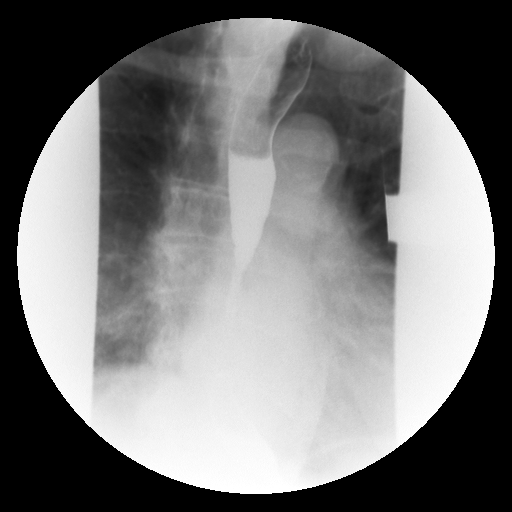

[Series 7: run · 1 of 1 slices shown (6 of 19)]
[im 1/1]
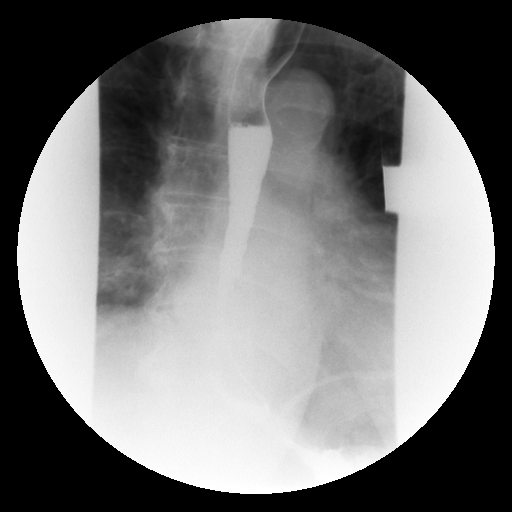

[Series 9: run · 1 of 1 slices shown (7 of 19)]
[im 1/1]
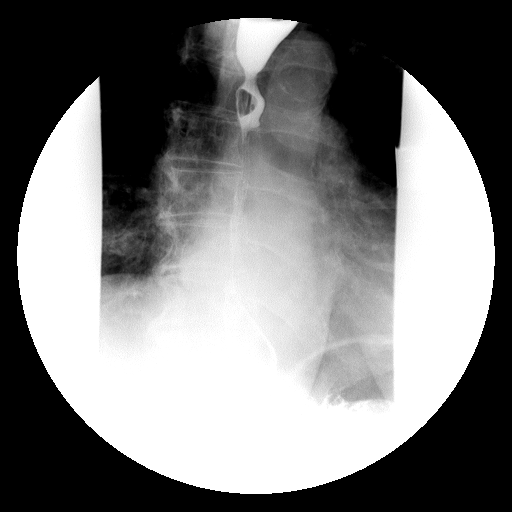

[Series 10: run · 1 of 1 slices shown (8 of 19)]
[im 1/1]
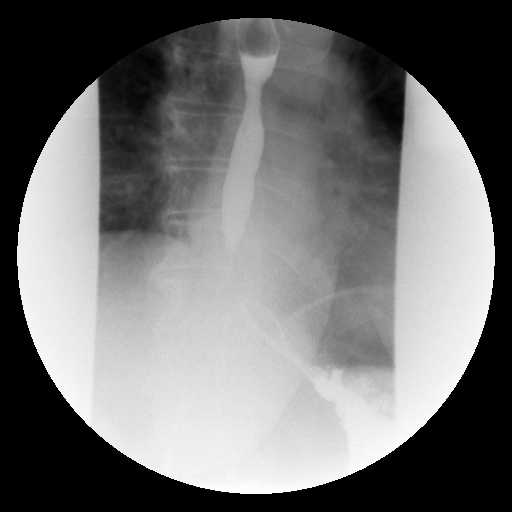

[Series 11: run · 1 of 1 slices shown (9 of 19)]
[im 1/1]
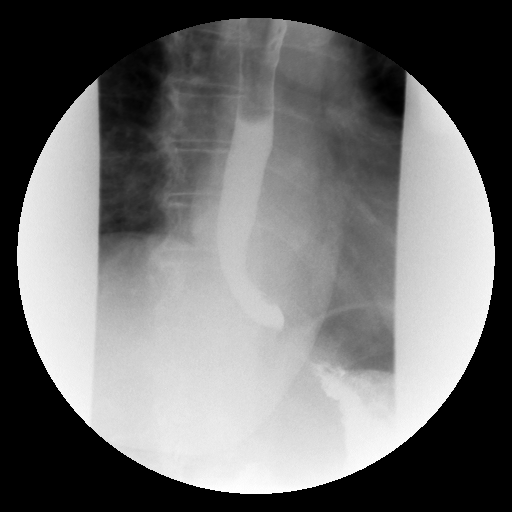

[Series 13: run · 1 of 1 slices shown (10 of 19)]
[im 1/1]
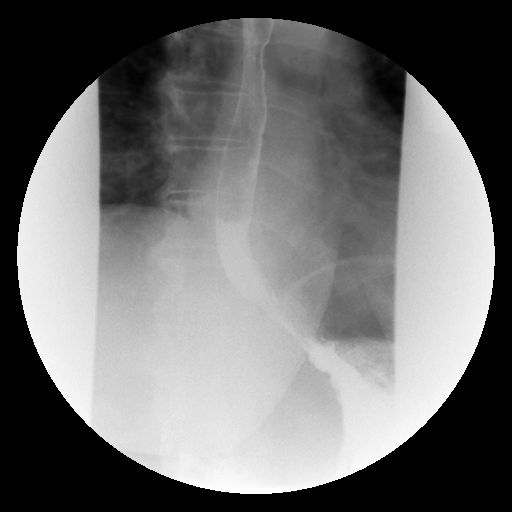

[Series 14: run · 1 of 1 slices shown (11 of 19)]
[im 1/1]
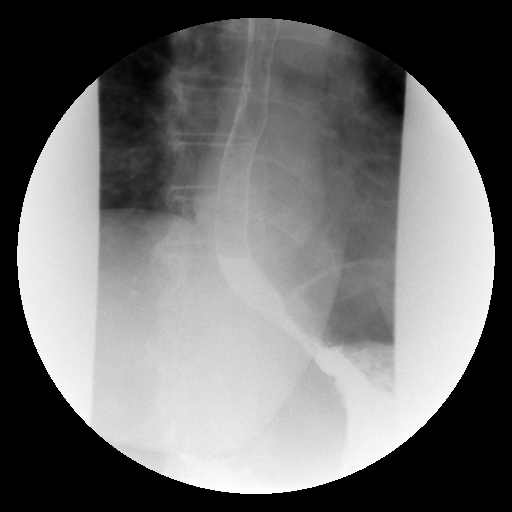

[Series 15: run · 1 of 1 slices shown (12 of 19)]
[im 1/1]
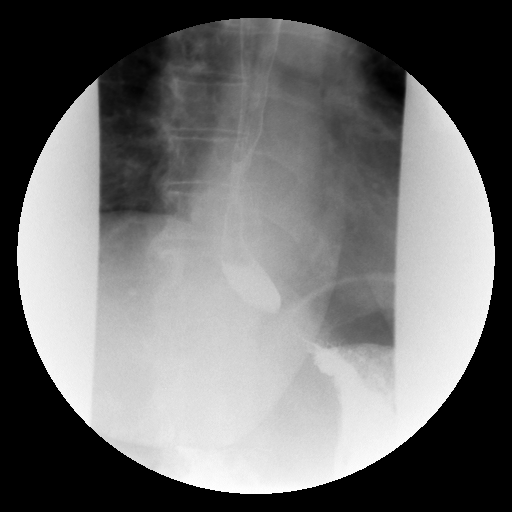

[Series 16: run · 1 of 1 slices shown (13 of 19)]
[im 1/1]
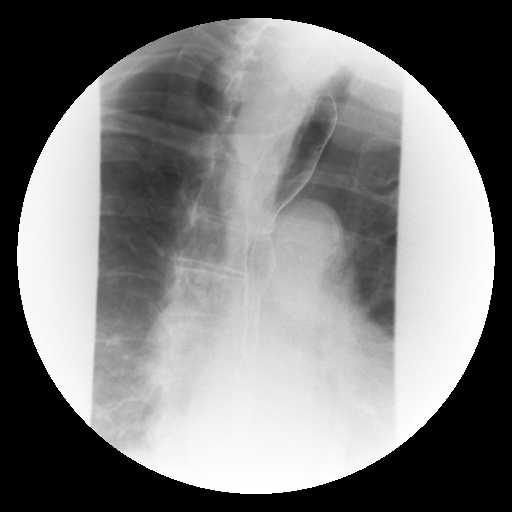

[Series 18: run · 1 of 1 slices shown (14 of 19)]
[im 1/1]
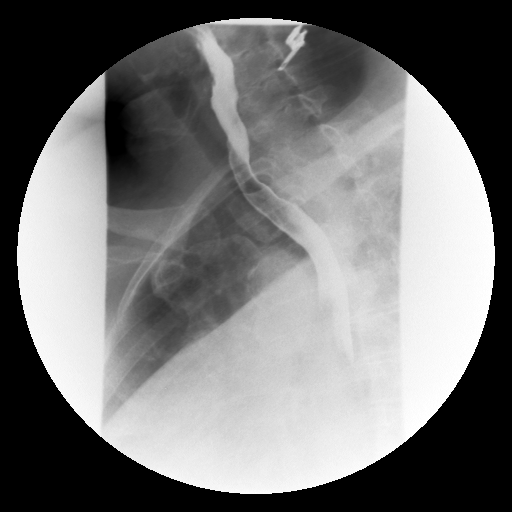

[Series 19: run · 1 of 1 slices shown (15 of 19)]
[im 1/1]
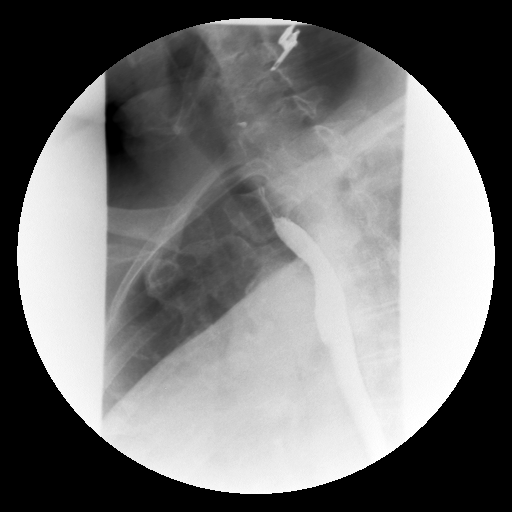

[Series 20: run · 1 of 1 slices shown (16 of 19)]
[im 1/1]
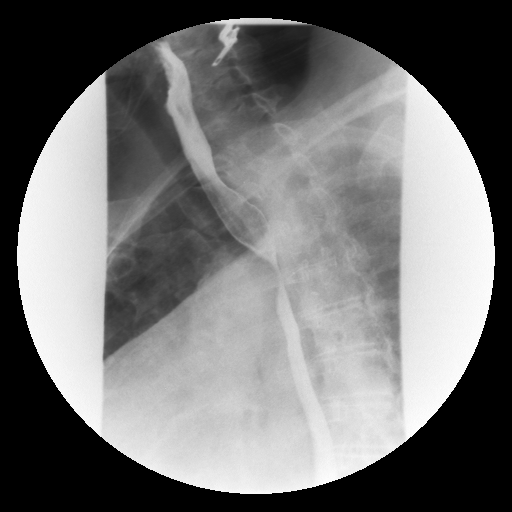

[Series 21: run · 1 of 1 slices shown (17 of 19)]
[im 1/1]
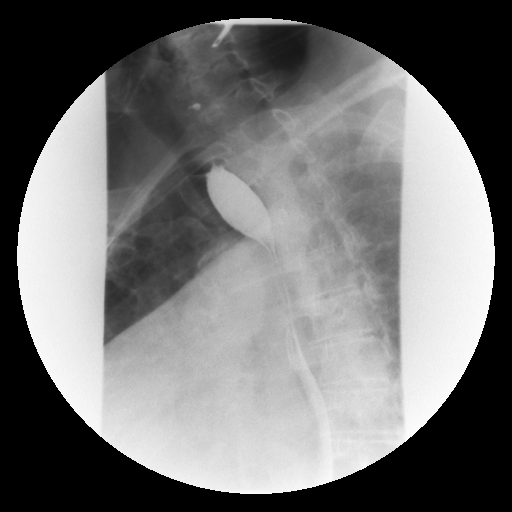

[Series 23: run · 1 of 1 slices shown (18 of 19)]
[im 1/1]
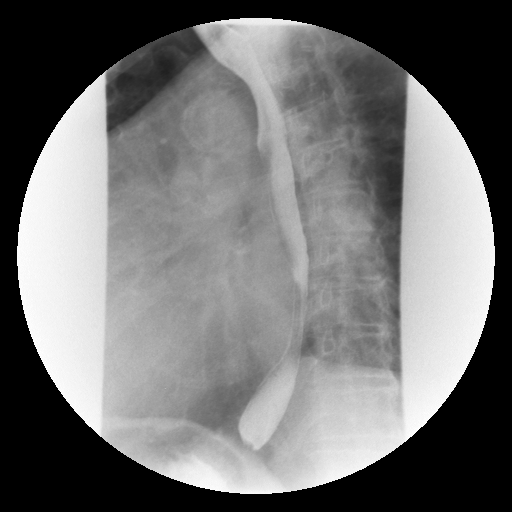

[Series 24: run · 1 of 1 slices shown (19 of 19)]
[im 1/1]
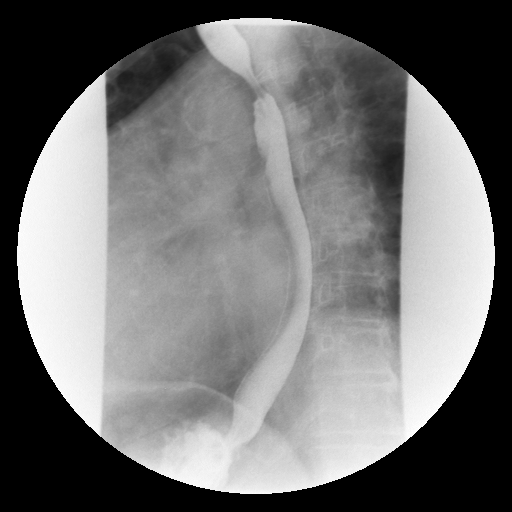

[19 of 24 positions shown; findings below may reference images not displayed]

FINDINGS: Fluoroscopic evaluation of pharyngeal function was assessed in the
lateral position. No evidence of laryngeal penetration or aspiration
with thin barium. However, with the patient swallowed the barium
tablet later in the study using water, the patient coughed/choked
which she stated is what is happening at home.

Normal esophageal motility. No fixed strictures, fold thickening or
mass. No reflux with the water siphon maneuver.
IMPRESSION: No visible aspiration or penetration with thin barium, but the
patient did begin coughing and choking when drinking water.

No esophageal abnormality.

## 2014-08-14 DIAGNOSIS — M9903 Segmental and somatic dysfunction of lumbar region: Secondary | ICD-10-CM | POA: Diagnosis not present

## 2014-08-14 DIAGNOSIS — M5431 Sciatica, right side: Secondary | ICD-10-CM | POA: Diagnosis not present

## 2014-08-14 DIAGNOSIS — M9904 Segmental and somatic dysfunction of sacral region: Secondary | ICD-10-CM | POA: Diagnosis not present

## 2014-08-14 DIAGNOSIS — M791 Myalgia: Secondary | ICD-10-CM | POA: Diagnosis not present

## 2014-08-14 DIAGNOSIS — M9902 Segmental and somatic dysfunction of thoracic region: Secondary | ICD-10-CM | POA: Diagnosis not present

## 2014-08-14 DIAGNOSIS — M9901 Segmental and somatic dysfunction of cervical region: Secondary | ICD-10-CM | POA: Diagnosis not present

## 2014-08-16 DIAGNOSIS — M9901 Segmental and somatic dysfunction of cervical region: Secondary | ICD-10-CM | POA: Diagnosis not present

## 2014-08-16 DIAGNOSIS — M791 Myalgia: Secondary | ICD-10-CM | POA: Diagnosis not present

## 2014-08-16 DIAGNOSIS — M5431 Sciatica, right side: Secondary | ICD-10-CM | POA: Diagnosis not present

## 2014-08-16 DIAGNOSIS — M9904 Segmental and somatic dysfunction of sacral region: Secondary | ICD-10-CM | POA: Diagnosis not present

## 2014-08-16 DIAGNOSIS — M9902 Segmental and somatic dysfunction of thoracic region: Secondary | ICD-10-CM | POA: Diagnosis not present

## 2014-08-16 DIAGNOSIS — M9903 Segmental and somatic dysfunction of lumbar region: Secondary | ICD-10-CM | POA: Diagnosis not present

## 2014-08-21 DIAGNOSIS — M9903 Segmental and somatic dysfunction of lumbar region: Secondary | ICD-10-CM | POA: Diagnosis not present

## 2014-08-21 DIAGNOSIS — M791 Myalgia: Secondary | ICD-10-CM | POA: Diagnosis not present

## 2014-08-21 DIAGNOSIS — M5431 Sciatica, right side: Secondary | ICD-10-CM | POA: Diagnosis not present

## 2014-08-21 DIAGNOSIS — M9902 Segmental and somatic dysfunction of thoracic region: Secondary | ICD-10-CM | POA: Diagnosis not present

## 2014-08-21 DIAGNOSIS — M9904 Segmental and somatic dysfunction of sacral region: Secondary | ICD-10-CM | POA: Diagnosis not present

## 2014-08-21 DIAGNOSIS — M9901 Segmental and somatic dysfunction of cervical region: Secondary | ICD-10-CM | POA: Diagnosis not present

## 2014-08-25 DIAGNOSIS — M9901 Segmental and somatic dysfunction of cervical region: Secondary | ICD-10-CM | POA: Diagnosis not present

## 2014-08-25 DIAGNOSIS — M791 Myalgia: Secondary | ICD-10-CM | POA: Diagnosis not present

## 2014-08-25 DIAGNOSIS — M9903 Segmental and somatic dysfunction of lumbar region: Secondary | ICD-10-CM | POA: Diagnosis not present

## 2014-08-25 DIAGNOSIS — M9902 Segmental and somatic dysfunction of thoracic region: Secondary | ICD-10-CM | POA: Diagnosis not present

## 2014-08-25 DIAGNOSIS — M9904 Segmental and somatic dysfunction of sacral region: Secondary | ICD-10-CM | POA: Diagnosis not present

## 2014-08-25 DIAGNOSIS — M5431 Sciatica, right side: Secondary | ICD-10-CM | POA: Diagnosis not present

## 2014-08-30 DIAGNOSIS — M9904 Segmental and somatic dysfunction of sacral region: Secondary | ICD-10-CM | POA: Diagnosis not present

## 2014-08-30 DIAGNOSIS — M791 Myalgia: Secondary | ICD-10-CM | POA: Diagnosis not present

## 2014-08-30 DIAGNOSIS — M9903 Segmental and somatic dysfunction of lumbar region: Secondary | ICD-10-CM | POA: Diagnosis not present

## 2014-08-30 DIAGNOSIS — M9902 Segmental and somatic dysfunction of thoracic region: Secondary | ICD-10-CM | POA: Diagnosis not present

## 2014-08-30 DIAGNOSIS — M9901 Segmental and somatic dysfunction of cervical region: Secondary | ICD-10-CM | POA: Diagnosis not present

## 2014-08-30 DIAGNOSIS — M5431 Sciatica, right side: Secondary | ICD-10-CM | POA: Diagnosis not present

## 2014-09-06 DIAGNOSIS — M5431 Sciatica, right side: Secondary | ICD-10-CM | POA: Diagnosis not present

## 2014-09-06 DIAGNOSIS — M9902 Segmental and somatic dysfunction of thoracic region: Secondary | ICD-10-CM | POA: Diagnosis not present

## 2014-09-06 DIAGNOSIS — M9901 Segmental and somatic dysfunction of cervical region: Secondary | ICD-10-CM | POA: Diagnosis not present

## 2014-09-06 DIAGNOSIS — M791 Myalgia: Secondary | ICD-10-CM | POA: Diagnosis not present

## 2014-09-06 DIAGNOSIS — M9904 Segmental and somatic dysfunction of sacral region: Secondary | ICD-10-CM | POA: Diagnosis not present

## 2014-09-06 DIAGNOSIS — M9903 Segmental and somatic dysfunction of lumbar region: Secondary | ICD-10-CM | POA: Diagnosis not present

## 2014-09-11 DIAGNOSIS — H6123 Impacted cerumen, bilateral: Secondary | ICD-10-CM | POA: Diagnosis not present

## 2014-09-11 DIAGNOSIS — H60391 Other infective otitis externa, right ear: Secondary | ICD-10-CM | POA: Diagnosis not present

## 2014-09-14 DIAGNOSIS — K219 Gastro-esophageal reflux disease without esophagitis: Secondary | ICD-10-CM | POA: Diagnosis not present

## 2014-09-14 DIAGNOSIS — M1990 Unspecified osteoarthritis, unspecified site: Secondary | ICD-10-CM | POA: Diagnosis not present

## 2014-09-14 DIAGNOSIS — Z7901 Long term (current) use of anticoagulants: Secondary | ICD-10-CM | POA: Diagnosis not present

## 2014-09-14 DIAGNOSIS — I4891 Unspecified atrial fibrillation: Secondary | ICD-10-CM | POA: Diagnosis not present

## 2014-09-14 DIAGNOSIS — E039 Hypothyroidism, unspecified: Secondary | ICD-10-CM | POA: Diagnosis not present

## 2014-09-14 DIAGNOSIS — E780 Pure hypercholesterolemia: Secondary | ICD-10-CM | POA: Diagnosis not present

## 2014-09-14 DIAGNOSIS — I10 Essential (primary) hypertension: Secondary | ICD-10-CM | POA: Diagnosis not present

## 2014-09-19 DIAGNOSIS — H7292 Unspecified perforation of tympanic membrane, left ear: Secondary | ICD-10-CM | POA: Diagnosis not present

## 2014-09-20 DIAGNOSIS — M791 Myalgia: Secondary | ICD-10-CM | POA: Diagnosis not present

## 2014-09-20 DIAGNOSIS — M9902 Segmental and somatic dysfunction of thoracic region: Secondary | ICD-10-CM | POA: Diagnosis not present

## 2014-09-20 DIAGNOSIS — M5431 Sciatica, right side: Secondary | ICD-10-CM | POA: Diagnosis not present

## 2014-09-20 DIAGNOSIS — M9901 Segmental and somatic dysfunction of cervical region: Secondary | ICD-10-CM | POA: Diagnosis not present

## 2014-09-20 DIAGNOSIS — M9904 Segmental and somatic dysfunction of sacral region: Secondary | ICD-10-CM | POA: Diagnosis not present

## 2014-09-20 DIAGNOSIS — M9903 Segmental and somatic dysfunction of lumbar region: Secondary | ICD-10-CM | POA: Diagnosis not present

## 2014-10-04 DIAGNOSIS — Z86010 Personal history of colonic polyps: Secondary | ICD-10-CM | POA: Diagnosis not present

## 2014-10-04 DIAGNOSIS — R197 Diarrhea, unspecified: Secondary | ICD-10-CM | POA: Diagnosis not present

## 2014-10-12 DIAGNOSIS — I4891 Unspecified atrial fibrillation: Secondary | ICD-10-CM | POA: Diagnosis not present

## 2014-10-12 DIAGNOSIS — Z7901 Long term (current) use of anticoagulants: Secondary | ICD-10-CM | POA: Diagnosis not present

## 2014-10-17 DIAGNOSIS — M7751 Other enthesopathy of right foot: Secondary | ICD-10-CM | POA: Diagnosis not present

## 2014-10-17 DIAGNOSIS — M79671 Pain in right foot: Secondary | ICD-10-CM | POA: Diagnosis not present

## 2014-10-17 DIAGNOSIS — M79672 Pain in left foot: Secondary | ICD-10-CM | POA: Diagnosis not present

## 2014-10-17 DIAGNOSIS — M7752 Other enthesopathy of left foot: Secondary | ICD-10-CM | POA: Diagnosis not present

## 2014-10-19 DIAGNOSIS — M79671 Pain in right foot: Secondary | ICD-10-CM | POA: Diagnosis not present

## 2014-10-19 DIAGNOSIS — R262 Difficulty in walking, not elsewhere classified: Secondary | ICD-10-CM | POA: Diagnosis not present

## 2014-10-19 DIAGNOSIS — M25672 Stiffness of left ankle, not elsewhere classified: Secondary | ICD-10-CM | POA: Diagnosis not present

## 2014-10-19 DIAGNOSIS — M25571 Pain in right ankle and joints of right foot: Secondary | ICD-10-CM | POA: Diagnosis not present

## 2014-10-23 DIAGNOSIS — S80869A Insect bite (nonvenomous), unspecified lower leg, initial encounter: Secondary | ICD-10-CM | POA: Diagnosis not present

## 2014-10-23 DIAGNOSIS — L989 Disorder of the skin and subcutaneous tissue, unspecified: Secondary | ICD-10-CM | POA: Diagnosis not present

## 2014-10-24 DIAGNOSIS — M25571 Pain in right ankle and joints of right foot: Secondary | ICD-10-CM | POA: Diagnosis not present

## 2014-10-24 DIAGNOSIS — M79671 Pain in right foot: Secondary | ICD-10-CM | POA: Diagnosis not present

## 2014-10-24 DIAGNOSIS — M25672 Stiffness of left ankle, not elsewhere classified: Secondary | ICD-10-CM | POA: Diagnosis not present

## 2014-10-24 DIAGNOSIS — R262 Difficulty in walking, not elsewhere classified: Secondary | ICD-10-CM | POA: Diagnosis not present

## 2014-10-26 DIAGNOSIS — M25672 Stiffness of left ankle, not elsewhere classified: Secondary | ICD-10-CM | POA: Diagnosis not present

## 2014-10-26 DIAGNOSIS — R262 Difficulty in walking, not elsewhere classified: Secondary | ICD-10-CM | POA: Diagnosis not present

## 2014-10-26 DIAGNOSIS — M25571 Pain in right ankle and joints of right foot: Secondary | ICD-10-CM | POA: Diagnosis not present

## 2014-10-26 DIAGNOSIS — M79671 Pain in right foot: Secondary | ICD-10-CM | POA: Diagnosis not present

## 2014-10-31 DIAGNOSIS — R262 Difficulty in walking, not elsewhere classified: Secondary | ICD-10-CM | POA: Diagnosis not present

## 2014-10-31 DIAGNOSIS — M79671 Pain in right foot: Secondary | ICD-10-CM | POA: Diagnosis not present

## 2014-10-31 DIAGNOSIS — M25571 Pain in right ankle and joints of right foot: Secondary | ICD-10-CM | POA: Diagnosis not present

## 2014-10-31 DIAGNOSIS — M25672 Stiffness of left ankle, not elsewhere classified: Secondary | ICD-10-CM | POA: Diagnosis not present

## 2014-11-01 DIAGNOSIS — R262 Difficulty in walking, not elsewhere classified: Secondary | ICD-10-CM | POA: Diagnosis not present

## 2014-11-01 DIAGNOSIS — M25672 Stiffness of left ankle, not elsewhere classified: Secondary | ICD-10-CM | POA: Diagnosis not present

## 2014-11-01 DIAGNOSIS — M79671 Pain in right foot: Secondary | ICD-10-CM | POA: Diagnosis not present

## 2014-11-01 DIAGNOSIS — M25571 Pain in right ankle and joints of right foot: Secondary | ICD-10-CM | POA: Diagnosis not present

## 2014-11-07 DIAGNOSIS — R262 Difficulty in walking, not elsewhere classified: Secondary | ICD-10-CM | POA: Diagnosis not present

## 2014-11-07 DIAGNOSIS — M7751 Other enthesopathy of right foot: Secondary | ICD-10-CM | POA: Diagnosis not present

## 2014-11-07 DIAGNOSIS — M25571 Pain in right ankle and joints of right foot: Secondary | ICD-10-CM | POA: Diagnosis not present

## 2014-11-07 DIAGNOSIS — M79672 Pain in left foot: Secondary | ICD-10-CM | POA: Diagnosis not present

## 2014-11-07 DIAGNOSIS — M7752 Other enthesopathy of left foot: Secondary | ICD-10-CM | POA: Diagnosis not present

## 2014-11-07 DIAGNOSIS — M25672 Stiffness of left ankle, not elsewhere classified: Secondary | ICD-10-CM | POA: Diagnosis not present

## 2014-11-07 DIAGNOSIS — M79671 Pain in right foot: Secondary | ICD-10-CM | POA: Diagnosis not present

## 2014-11-10 DIAGNOSIS — Z7901 Long term (current) use of anticoagulants: Secondary | ICD-10-CM | POA: Diagnosis not present

## 2014-11-10 DIAGNOSIS — I4891 Unspecified atrial fibrillation: Secondary | ICD-10-CM | POA: Diagnosis not present

## 2014-11-15 DIAGNOSIS — R197 Diarrhea, unspecified: Secondary | ICD-10-CM | POA: Diagnosis not present

## 2014-11-16 DIAGNOSIS — G5602 Carpal tunnel syndrome, left upper limb: Secondary | ICD-10-CM | POA: Diagnosis not present

## 2014-11-16 DIAGNOSIS — R634 Abnormal weight loss: Secondary | ICD-10-CM | POA: Diagnosis not present

## 2014-11-16 DIAGNOSIS — M5442 Lumbago with sciatica, left side: Secondary | ICD-10-CM | POA: Diagnosis not present

## 2014-11-16 DIAGNOSIS — R202 Paresthesia of skin: Secondary | ICD-10-CM | POA: Diagnosis not present

## 2014-11-16 DIAGNOSIS — G609 Hereditary and idiopathic neuropathy, unspecified: Secondary | ICD-10-CM | POA: Diagnosis not present

## 2014-11-16 DIAGNOSIS — G5601 Carpal tunnel syndrome, right upper limb: Secondary | ICD-10-CM | POA: Diagnosis not present

## 2014-11-16 DIAGNOSIS — G2581 Restless legs syndrome: Secondary | ICD-10-CM | POA: Diagnosis not present

## 2014-11-16 DIAGNOSIS — M5417 Radiculopathy, lumbosacral region: Secondary | ICD-10-CM | POA: Diagnosis not present

## 2014-11-16 DIAGNOSIS — G603 Idiopathic progressive neuropathy: Secondary | ICD-10-CM | POA: Diagnosis not present

## 2014-11-16 DIAGNOSIS — M5441 Lumbago with sciatica, right side: Secondary | ICD-10-CM | POA: Diagnosis not present

## 2014-12-08 DIAGNOSIS — I4891 Unspecified atrial fibrillation: Secondary | ICD-10-CM | POA: Diagnosis not present

## 2014-12-08 DIAGNOSIS — Z7901 Long term (current) use of anticoagulants: Secondary | ICD-10-CM | POA: Diagnosis not present

## 2014-12-20 DIAGNOSIS — G5601 Carpal tunnel syndrome, right upper limb: Secondary | ICD-10-CM | POA: Diagnosis not present

## 2014-12-20 DIAGNOSIS — M5442 Lumbago with sciatica, left side: Secondary | ICD-10-CM | POA: Diagnosis not present

## 2014-12-20 DIAGNOSIS — G5602 Carpal tunnel syndrome, left upper limb: Secondary | ICD-10-CM | POA: Diagnosis not present

## 2014-12-20 DIAGNOSIS — M5417 Radiculopathy, lumbosacral region: Secondary | ICD-10-CM | POA: Diagnosis not present

## 2014-12-20 DIAGNOSIS — G2581 Restless legs syndrome: Secondary | ICD-10-CM | POA: Diagnosis not present

## 2014-12-20 DIAGNOSIS — M5441 Lumbago with sciatica, right side: Secondary | ICD-10-CM | POA: Diagnosis not present

## 2015-01-05 DIAGNOSIS — I4891 Unspecified atrial fibrillation: Secondary | ICD-10-CM | POA: Diagnosis not present

## 2015-01-05 DIAGNOSIS — Z7901 Long term (current) use of anticoagulants: Secondary | ICD-10-CM | POA: Diagnosis not present

## 2015-01-12 DIAGNOSIS — Z7901 Long term (current) use of anticoagulants: Secondary | ICD-10-CM | POA: Diagnosis not present

## 2015-01-12 DIAGNOSIS — I482 Chronic atrial fibrillation: Secondary | ICD-10-CM | POA: Diagnosis not present

## 2015-01-25 DIAGNOSIS — M5441 Lumbago with sciatica, right side: Secondary | ICD-10-CM | POA: Diagnosis not present

## 2015-01-25 DIAGNOSIS — M5442 Lumbago with sciatica, left side: Secondary | ICD-10-CM | POA: Diagnosis not present

## 2015-01-25 DIAGNOSIS — G5602 Carpal tunnel syndrome, left upper limb: Secondary | ICD-10-CM | POA: Diagnosis not present

## 2015-01-25 DIAGNOSIS — G2581 Restless legs syndrome: Secondary | ICD-10-CM | POA: Diagnosis not present

## 2015-01-25 DIAGNOSIS — M5417 Radiculopathy, lumbosacral region: Secondary | ICD-10-CM | POA: Diagnosis not present

## 2015-01-25 DIAGNOSIS — G5601 Carpal tunnel syndrome, right upper limb: Secondary | ICD-10-CM | POA: Diagnosis not present

## 2015-01-29 DIAGNOSIS — Z7901 Long term (current) use of anticoagulants: Secondary | ICD-10-CM | POA: Diagnosis not present

## 2015-01-29 DIAGNOSIS — I482 Chronic atrial fibrillation: Secondary | ICD-10-CM | POA: Diagnosis not present

## 2015-02-05 DIAGNOSIS — Z7901 Long term (current) use of anticoagulants: Secondary | ICD-10-CM | POA: Diagnosis not present

## 2015-02-07 DIAGNOSIS — M79604 Pain in right leg: Secondary | ICD-10-CM | POA: Diagnosis not present

## 2015-02-07 DIAGNOSIS — R52 Pain, unspecified: Secondary | ICD-10-CM | POA: Diagnosis not present

## 2015-02-08 DIAGNOSIS — M797 Fibromyalgia: Secondary | ICD-10-CM | POA: Diagnosis not present

## 2015-02-08 DIAGNOSIS — M1990 Unspecified osteoarthritis, unspecified site: Secondary | ICD-10-CM | POA: Diagnosis not present

## 2015-02-08 DIAGNOSIS — K219 Gastro-esophageal reflux disease without esophagitis: Secondary | ICD-10-CM | POA: Diagnosis not present

## 2015-02-08 DIAGNOSIS — A239 Brucellosis, unspecified: Secondary | ICD-10-CM | POA: Diagnosis not present

## 2015-02-08 DIAGNOSIS — Z7901 Long term (current) use of anticoagulants: Secondary | ICD-10-CM | POA: Diagnosis not present

## 2015-02-08 DIAGNOSIS — M5431 Sciatica, right side: Secondary | ICD-10-CM | POA: Diagnosis not present

## 2015-02-08 DIAGNOSIS — Z79899 Other long term (current) drug therapy: Secondary | ICD-10-CM | POA: Diagnosis not present

## 2015-02-08 DIAGNOSIS — M79604 Pain in right leg: Secondary | ICD-10-CM | POA: Diagnosis not present

## 2015-02-08 DIAGNOSIS — E079 Disorder of thyroid, unspecified: Secondary | ICD-10-CM | POA: Diagnosis not present

## 2015-02-08 DIAGNOSIS — R609 Edema, unspecified: Secondary | ICD-10-CM | POA: Diagnosis not present

## 2015-02-08 DIAGNOSIS — I10 Essential (primary) hypertension: Secondary | ICD-10-CM | POA: Diagnosis not present

## 2015-02-08 DIAGNOSIS — Z86718 Personal history of other venous thrombosis and embolism: Secondary | ICD-10-CM | POA: Diagnosis not present

## 2015-02-13 DIAGNOSIS — I4891 Unspecified atrial fibrillation: Secondary | ICD-10-CM | POA: Diagnosis not present

## 2015-02-13 DIAGNOSIS — Z7901 Long term (current) use of anticoagulants: Secondary | ICD-10-CM | POA: Diagnosis not present

## 2015-02-19 DIAGNOSIS — H7291 Unspecified perforation of tympanic membrane, right ear: Secondary | ICD-10-CM | POA: Diagnosis not present

## 2015-02-19 DIAGNOSIS — H6122 Impacted cerumen, left ear: Secondary | ICD-10-CM | POA: Diagnosis not present

## 2015-02-19 DIAGNOSIS — H60391 Other infective otitis externa, right ear: Secondary | ICD-10-CM | POA: Diagnosis not present

## 2015-02-20 DIAGNOSIS — M1711 Unilateral primary osteoarthritis, right knee: Secondary | ICD-10-CM | POA: Diagnosis not present

## 2015-03-06 DIAGNOSIS — Z23 Encounter for immunization: Secondary | ICD-10-CM | POA: Diagnosis not present

## 2015-03-06 DIAGNOSIS — Z7901 Long term (current) use of anticoagulants: Secondary | ICD-10-CM | POA: Diagnosis not present

## 2015-03-06 DIAGNOSIS — I482 Chronic atrial fibrillation: Secondary | ICD-10-CM | POA: Diagnosis not present

## 2015-03-08 DIAGNOSIS — M5442 Lumbago with sciatica, left side: Secondary | ICD-10-CM | POA: Diagnosis not present

## 2015-03-08 DIAGNOSIS — M797 Fibromyalgia: Secondary | ICD-10-CM | POA: Diagnosis not present

## 2015-03-08 DIAGNOSIS — G2581 Restless legs syndrome: Secondary | ICD-10-CM | POA: Diagnosis not present

## 2015-03-08 DIAGNOSIS — M5441 Lumbago with sciatica, right side: Secondary | ICD-10-CM | POA: Diagnosis not present

## 2015-03-08 DIAGNOSIS — G603 Idiopathic progressive neuropathy: Secondary | ICD-10-CM | POA: Diagnosis not present

## 2015-03-20 DIAGNOSIS — Z7901 Long term (current) use of anticoagulants: Secondary | ICD-10-CM | POA: Diagnosis not present

## 2015-03-20 DIAGNOSIS — I4891 Unspecified atrial fibrillation: Secondary | ICD-10-CM | POA: Diagnosis not present

## 2015-03-22 DIAGNOSIS — M1711 Unilateral primary osteoarthritis, right knee: Secondary | ICD-10-CM | POA: Diagnosis not present

## 2015-04-01 ENCOUNTER — Emergency Department (INDEPENDENT_AMBULATORY_CARE_PROVIDER_SITE_OTHER)
Admission: EM | Admit: 2015-04-01 | Discharge: 2015-04-01 | Disposition: A | Discharge status: Home / Self Care | Payer: Medicare Other | Source: Home / Self Care | Attending: Family Medicine | Admitting: Family Medicine

## 2015-04-01 DIAGNOSIS — R300 Dysuria: Secondary | ICD-10-CM | POA: Diagnosis not present

## 2015-04-01 DIAGNOSIS — N3090 Cystitis, unspecified without hematuria: Secondary | ICD-10-CM

## 2015-04-01 LAB — POCT URINALYSIS DIP (MANUAL ENTRY)
Bilirubin, UA: NEGATIVE
Glucose, UA: NEGATIVE
Ketones, POC UA: NEGATIVE
Nitrite, UA: POSITIVE — AB
Protein Ur, POC: 100 — AB
Spec Grav, UA: 1.025 (ref 1.005–1.03)
Urobilinogen, UA: 0.2 (ref 0–1)
pH, UA: 6 (ref 5–8)

## 2015-04-01 MED ORDER — CEPHALEXIN 500 MG PO CAPS: 500.0000 mg | ORAL_CAPSULE | Freq: Two times a day (BID) | ORAL | Status: DC

## 2015-04-01 MED ORDER — PHENAZOPYRIDINE HCL 100 MG PO TABS: ORAL_TABLET | ORAL | Status: DC

## 2015-04-01 NOTE — ED Provider Notes (Signed)
CSN: [id number]     Arrival date & time 04/01/15  1114 History   First MD Initiated Contact with Patient 04/01/15 1153     Chief Complaint  Patient presents with  . Dysuria      HPI Comments: Patient complains of fatigue for 3 to 4 days, and last night developed frequency, urgency, and low back ache.  No abdominal pain.  No fevers, chills, and sweats.  Patient is a 81 y.o. female presenting with dysuria. The history is provided by the patient.  Dysuria Pain quality:  Burning Pain severity:  Mild Onset quality:  Sudden Duration:  1 day Timing:  Constant Chronicity:  New Recent urinary tract infections: no   Relieved by:  Nothing Worsened by:  Nothing tried Ineffective treatments:  None tried Urinary symptoms: frequent urination, hesitancy and incontinence   Urinary symptoms: no discolored urine, no foul-smelling urine and no hematuria   Associated symptoms: no abdominal pain, no fever, no flank pain, no nausea and no vomiting     Past Medical History  Diagnosis Date  . Hypertension   . 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)   . Phlebitis     LLE  . DVT, lower extremity ([place]) 01/2002; 06/2003    RLE; LLE  . Pulmonary embolism ([place]) 2003  . Gallstones   . Pneumonia   . Shortness of breath on exertion   . Enlarged thyroid gland   . Fibromyalgia   . Osteoarthritis   . Hypothyroidism   . Borderline diabetes   . H/O hiatal hernia   . GERD (gastroesophageal reflux disease)   . History of stomach ulcers   . Migraine     "I've had 4; last time was when I was in my ?30's"  . Chronic lower back pain   . Clotting disorder ([place])   . Hyperlipidemia    Past Surgical History  Procedure Laterality Date  . Thoracotomy  2003  . Excision of goiter    . Middle ear surgery  1959; 1982; after 1982  . Bladder tack    . Hernia repair  1970's    ventral  . Appendectomy  1960  . Abdominal hysterectomy  1972  . Cataract extraction, bilateral  ~ 2011  . Breast biopsy      left  . Mass  excision  12/2001    paravertebral chest mass  . Carpal tunnel release  1989    right  . Mini thoracotomy  12/2001  . Cholecystectomy  09/12/2011    Procedure: LAPAROSCOPIC CHOLECYSTECTOMY WITH INTRAOPERATIVE CHOLANGIOGRAM;  Surgeon: Shann Medal, MD;  Location: [facility];  Service: General;  Laterality: N/A;  . External ear surgery      x3   Family History  Problem Relation Age of Onset  . Heart disease Mother     had CHF  . Stroke Father    Social History  Substance Use Topics  . Smoking status: Never Smoker   . Smokeless tobacco: Never Used  . Alcohol Use: No   OB History    No data available     Review of Systems  Constitutional: Negative for fever.  Gastrointestinal: Negative for nausea, vomiting and abdominal pain.  Genitourinary: Positive for dysuria. Negative for flank pain.  All other systems reviewed and are negative.   Allergies  Review of patient's allergies indicates no known allergies.  Home Medications   Prior to Admission medications   Medication Sig Start Date End Date Taking? Authorizing Provider  acetaminophen (TYLENOL) 500 MG tablet  Take 500 mg by mouth every 6 (six) hours as needed. For pain   Yes Historical Provider, MD  atenolol (TENORMIN) 25 MG tablet Take 25 mg by mouth daily.   Yes Historical Provider, MD  colchicine 0.6 MG tablet Take 0.6 mg by mouth daily.   Yes Historical Provider, MD  colesevelam (WELCHOL) 625 MG tablet Take 1,250 mg by mouth daily after lunch daily after lunch.   Yes Historical Provider, MD  furosemide (LASIX) 20 MG tablet Take 20 mg by mouth.   Yes Historical Provider, MD  gabapentin (NEURONTIN) 300 MG capsule Take 300 mg by mouth 4 (four) times daily.   Yes Historical Provider, MD  levothyroxine (SYNTHROID, LEVOTHROID) 75 MCG tablet Take 75 mcg by mouth daily.   Yes Historical Provider, MD  omeprazole (PRILOSEC) 20 MG capsule Take 20 mg by mouth daily.   Yes Historical Provider, MD  warfarin (COUMADIN) 1 MG tablet Take 7-8 mg  by mouth daily. Alternating doses. Take 2 tablets (1mg ) along with a 5mg  tablet to make a total of 7mg  one day. The next day take 3 tablets (1mg ) along with 5mg  tablet to make a total dose of 8mg .   Yes Historical Provider, MD  warfarin (COUMADIN) 5 MG tablet Take 7-8 mg by mouth daily. Alternating doses. Take 1 tablet (5mg ) plus two 1mg  tablets to make a total of 7mg  one day. On the next day take 1 tablet (5mg ) and three 1mg  tablets to make a total of 8mg .   Yes Historical Provider, MD  cephALEXin (KEFLEX) 500 MG capsule Take 1 capsule (500 mg total) by mouth 2 (two) times daily. 04/01/15   Kandra Nicolas, MD  phenazopyridine (PYRIDIUM) 100 MG tablet Take one tab by mouth twice daily for two days. Take after meals. 04/01/15   Kandra Nicolas, MD   Meds Ordered and Administered this Visit  Medications - No data to display  BP 110/68 mmHg  Pulse 76  Temp(Src) 98.2 F (36.8 C)  Ht 5\' 10"  (1.778 m)  Wt 264 lb (119.75 kg)  BMI 37.88 kg/m2  SpO2 97% No data found.   Physical Exam Nursing notes and Vital Signs reviewed. Appearance:  Patient appears stated age, and in no acute distress.  Patient is obese (BMI 37.9) Eyes:  Pupils are equal, round, and reactive to light and accomodation.  Extraocular movement is intact.  Conjunctivae are not inflamed   Nose:  Normal Pharynx:  Normal Neck:  Supple.   No adenopathy  Lungs:  Clear to auscultation.  Breath sounds are equal.  Moving air well. Heart:  Regular rate and rhythm without murmurs, rubs, or gallops.  Abdomen:  Nontender without masses or hepatosplenomegaly.  Bowel sounds are present.  No CVA or flank tenderness.  Extremities:  No edema.  No calf tenderness Skin:  No rash present.   ED Course  Procedures  None    Labs Reviewed  POCT URINALYSIS DIP (MANUAL ENTRY) - Abnormal; Notable for the following:    Blood, UA moderate (*)    Protein Ur, POC =100 (*)    Nitrite, UA Positive (*)    Leukocytes, UA small (1+) (*)    All other  components within normal limits  URINE CULTURE  POCT URINALYSIS DIP (MANUAL ENTRY)      MDM   1. Cystitis    Urine culture pending. Begin Keflex 500mg  BID for one week.  Low dose Pyridium 100mg  BID for two days. Increase fluid intake. If symptoms become significantly worse  during the night or over the weekend, proceed to the local emergency room.  Followup with PCP    Kandra Nicolas, MD 04/01/15 [phone]

## 2015-04-01 NOTE — ED Notes (Signed)
Patient complains of painful, scant, frequent urination since last night

## 2015-04-01 NOTE — Discharge Instructions (Signed)
Increase fluid intake. °If symptoms become significantly worse during the night or over the weekend, proceed to the local emergency room.  ° ° °Urinary Tract Infection °Urinary tract infections (UTIs) can develop anywhere along your urinary tract. Your urinary tract is your body's drainage system for removing wastes and extra water. Your urinary tract includes two kidneys, two ureters, a bladder, and a urethra. Your kidneys are a pair of bean-shaped organs. Each kidney is about the size of your fist. They are located below your ribs, one on each side of your spine. °CAUSES °Infections are caused by microbes, which are microscopic organisms, including fungi, viruses, and bacteria. These organisms are so small that they can only be seen through a microscope. Bacteria are the microbes that most commonly cause UTIs. °SYMPTOMS  °Symptoms of UTIs may vary by age and gender of the patient and by the location of the infection. Symptoms in young women typically include a frequent and intense urge to urinate and a painful, burning feeling in the bladder or urethra during urination. Older women and men are more likely to be tired, shaky, and weak and have muscle aches and abdominal pain. A fever may mean the infection is in your kidneys. Other symptoms of a kidney infection include pain in your back or sides below the ribs, nausea, and vomiting. °DIAGNOSIS °To diagnose a UTI, your caregiver will ask you about your symptoms. Your caregiver will also ask you to provide a urine sample. The urine sample will be tested for bacteria and white blood cells. White blood cells are made by your body to help fight infection. °TREATMENT  °Typically, UTIs can be treated with medication. Because most UTIs are caused by a bacterial infection, they usually can be treated with the use of antibiotics. The choice of antibiotic and length of treatment depend on your symptoms and the type of bacteria causing your infection. °HOME CARE  INSTRUCTIONS °· If you were prescribed antibiotics, take them exactly as your caregiver instructs you. Finish the medication even if you feel better after you have only taken some of the medication. °· Drink enough water and fluids to keep your urine clear or pale yellow. °· Avoid caffeine, tea, and carbonated beverages. They tend to irritate your bladder. °· Empty your bladder often. Avoid holding urine for long periods of time. °· Empty your bladder before and after sexual intercourse. °· After a bowel movement, women should cleanse from front to back. Use each tissue only once. °SEEK MEDICAL CARE IF:  °· You have back pain. °· You develop a fever. °· Your symptoms do not begin to resolve within 3 days. °SEEK IMMEDIATE MEDICAL CARE IF:  °· You have severe back pain or lower abdominal pain. °· You develop chills. °· You have nausea or vomiting. °· You have continued burning or discomfort with urination. °MAKE SURE YOU:  °· Understand these instructions. °· Will watch your condition. °· Will get help right away if you are not doing well or get worse. °  °This information is not intended to replace advice given to you by your health care provider. Make sure you discuss any questions you have with your health care provider. °  °Document Released: 03/05/2005 Document Revised: 02/14/2015 Document Reviewed: 07/04/2011 °Elsevier Interactive Patient Education ©2016 [employer] ° °

## 2015-04-03 DIAGNOSIS — Z7901 Long term (current) use of anticoagulants: Secondary | ICD-10-CM | POA: Diagnosis not present

## 2015-04-03 DIAGNOSIS — I4891 Unspecified atrial fibrillation: Secondary | ICD-10-CM | POA: Diagnosis not present

## 2015-04-04 ENCOUNTER — Telehealth: Payer: Self-pay | Admitting: Emergency Medicine

## 2015-04-04 LAB — URINE CULTURE: Colony Count: >=100000

## 2015-04-10 DIAGNOSIS — I10 Essential (primary) hypertension: Secondary | ICD-10-CM | POA: Diagnosis not present

## 2015-04-10 DIAGNOSIS — I4891 Unspecified atrial fibrillation: Secondary | ICD-10-CM | POA: Diagnosis not present

## 2015-04-10 DIAGNOSIS — Z7901 Long term (current) use of anticoagulants: Secondary | ICD-10-CM | POA: Diagnosis not present

## 2015-04-10 DIAGNOSIS — H60392 Other infective otitis externa, left ear: Secondary | ICD-10-CM | POA: Diagnosis not present

## 2015-04-10 DIAGNOSIS — H7292 Unspecified perforation of tympanic membrane, left ear: Secondary | ICD-10-CM | POA: Diagnosis not present

## 2015-05-09 DIAGNOSIS — E039 Hypothyroidism, unspecified: Secondary | ICD-10-CM | POA: Diagnosis not present

## 2015-05-09 DIAGNOSIS — I10 Essential (primary) hypertension: Secondary | ICD-10-CM | POA: Diagnosis not present

## 2015-05-09 DIAGNOSIS — E7800 Pure hypercholesterolemia, unspecified: Secondary | ICD-10-CM | POA: Diagnosis not present

## 2015-05-09 DIAGNOSIS — G629 Polyneuropathy, unspecified: Secondary | ICD-10-CM | POA: Diagnosis not present

## 2015-05-09 DIAGNOSIS — E850 Non-neuropathic heredofamilial amyloidosis: Secondary | ICD-10-CM | POA: Diagnosis not present

## 2015-05-09 DIAGNOSIS — Z86718 Personal history of other venous thrombosis and embolism: Secondary | ICD-10-CM | POA: Diagnosis not present

## 2015-05-09 DIAGNOSIS — Z7901 Long term (current) use of anticoagulants: Secondary | ICD-10-CM | POA: Diagnosis not present

## 2015-05-09 DIAGNOSIS — K219 Gastro-esophageal reflux disease without esophagitis: Secondary | ICD-10-CM | POA: Diagnosis not present

## 2015-05-09 DIAGNOSIS — E559 Vitamin D deficiency, unspecified: Secondary | ICD-10-CM | POA: Diagnosis not present

## 2015-05-09 DIAGNOSIS — R7309 Other abnormal glucose: Secondary | ICD-10-CM | POA: Diagnosis not present

## 2015-05-15 DIAGNOSIS — M1711 Unilateral primary osteoarthritis, right knee: Secondary | ICD-10-CM | POA: Diagnosis not present

## 2015-05-17 DIAGNOSIS — M5442 Lumbago with sciatica, left side: Secondary | ICD-10-CM | POA: Diagnosis not present

## 2015-05-17 DIAGNOSIS — G5603 Carpal tunnel syndrome, bilateral upper limbs: Secondary | ICD-10-CM | POA: Diagnosis not present

## 2015-05-17 DIAGNOSIS — M5441 Lumbago with sciatica, right side: Secondary | ICD-10-CM | POA: Diagnosis not present

## 2015-05-17 DIAGNOSIS — G603 Idiopathic progressive neuropathy: Secondary | ICD-10-CM | POA: Diagnosis not present

## 2015-05-17 DIAGNOSIS — R600 Localized edema: Secondary | ICD-10-CM | POA: Diagnosis not present

## 2015-05-17 DIAGNOSIS — M797 Fibromyalgia: Secondary | ICD-10-CM | POA: Diagnosis not present

## 2015-06-06 DIAGNOSIS — Z7901 Long term (current) use of anticoagulants: Secondary | ICD-10-CM | POA: Diagnosis not present

## 2015-06-06 DIAGNOSIS — I4891 Unspecified atrial fibrillation: Secondary | ICD-10-CM | POA: Diagnosis not present

## 2015-06-13 DIAGNOSIS — Z7901 Long term (current) use of anticoagulants: Secondary | ICD-10-CM | POA: Diagnosis not present

## 2015-06-13 DIAGNOSIS — I482 Chronic atrial fibrillation: Secondary | ICD-10-CM | POA: Diagnosis not present

## 2015-06-27 DIAGNOSIS — Z7901 Long term (current) use of anticoagulants: Secondary | ICD-10-CM | POA: Diagnosis not present

## 2015-06-27 DIAGNOSIS — I482 Chronic atrial fibrillation: Secondary | ICD-10-CM | POA: Diagnosis not present

## 2015-07-10 DIAGNOSIS — H7292 Unspecified perforation of tympanic membrane, left ear: Secondary | ICD-10-CM | POA: Diagnosis not present

## 2015-07-13 DIAGNOSIS — R070 Pain in throat: Secondary | ICD-10-CM | POA: Diagnosis not present

## 2015-07-25 DIAGNOSIS — I482 Chronic atrial fibrillation: Secondary | ICD-10-CM | POA: Diagnosis not present

## 2015-07-25 DIAGNOSIS — Z7901 Long term (current) use of anticoagulants: Secondary | ICD-10-CM | POA: Diagnosis not present

## 2015-08-22 DIAGNOSIS — Z7901 Long term (current) use of anticoagulants: Secondary | ICD-10-CM | POA: Diagnosis not present

## 2015-08-22 DIAGNOSIS — I482 Chronic atrial fibrillation: Secondary | ICD-10-CM | POA: Diagnosis not present

## 2015-08-22 DIAGNOSIS — R509 Fever, unspecified: Secondary | ICD-10-CM | POA: Diagnosis not present

## 2015-08-23 DIAGNOSIS — G603 Idiopathic progressive neuropathy: Secondary | ICD-10-CM | POA: Diagnosis not present

## 2015-08-23 DIAGNOSIS — G5601 Carpal tunnel syndrome, right upper limb: Secondary | ICD-10-CM | POA: Diagnosis not present

## 2015-08-23 DIAGNOSIS — G5602 Carpal tunnel syndrome, left upper limb: Secondary | ICD-10-CM | POA: Diagnosis not present

## 2015-08-23 DIAGNOSIS — M5442 Lumbago with sciatica, left side: Secondary | ICD-10-CM | POA: Diagnosis not present

## 2015-08-23 DIAGNOSIS — M5441 Lumbago with sciatica, right side: Secondary | ICD-10-CM | POA: Diagnosis not present

## 2015-08-23 DIAGNOSIS — M797 Fibromyalgia: Secondary | ICD-10-CM | POA: Diagnosis not present

## 2015-09-05 DIAGNOSIS — I482 Chronic atrial fibrillation: Secondary | ICD-10-CM | POA: Diagnosis not present

## 2015-09-05 DIAGNOSIS — Z7901 Long term (current) use of anticoagulants: Secondary | ICD-10-CM | POA: Diagnosis not present

## 2015-10-03 DIAGNOSIS — R609 Edema, unspecified: Secondary | ICD-10-CM | POA: Diagnosis not present

## 2015-10-03 DIAGNOSIS — R600 Localized edema: Secondary | ICD-10-CM | POA: Diagnosis not present

## 2015-10-03 DIAGNOSIS — Z7901 Long term (current) use of anticoagulants: Secondary | ICD-10-CM | POA: Diagnosis not present

## 2015-10-03 DIAGNOSIS — I482 Chronic atrial fibrillation: Secondary | ICD-10-CM | POA: Diagnosis not present

## 2015-10-23 DIAGNOSIS — M25561 Pain in right knee: Secondary | ICD-10-CM | POA: Diagnosis not present

## 2015-10-23 DIAGNOSIS — M25562 Pain in left knee: Secondary | ICD-10-CM | POA: Diagnosis not present

## 2015-11-02 DIAGNOSIS — I482 Chronic atrial fibrillation: Secondary | ICD-10-CM | POA: Diagnosis not present

## 2015-11-02 DIAGNOSIS — Z7901 Long term (current) use of anticoagulants: Secondary | ICD-10-CM | POA: Diagnosis not present

## 2015-11-15 DIAGNOSIS — M5417 Radiculopathy, lumbosacral region: Secondary | ICD-10-CM | POA: Diagnosis not present

## 2015-11-15 DIAGNOSIS — G2581 Restless legs syndrome: Secondary | ICD-10-CM | POA: Diagnosis not present

## 2015-11-15 DIAGNOSIS — M797 Fibromyalgia: Secondary | ICD-10-CM | POA: Diagnosis not present

## 2015-11-15 DIAGNOSIS — G5622 Lesion of ulnar nerve, left upper limb: Secondary | ICD-10-CM | POA: Diagnosis not present

## 2015-11-15 DIAGNOSIS — G5603 Carpal tunnel syndrome, bilateral upper limbs: Secondary | ICD-10-CM | POA: Diagnosis not present

## 2015-11-15 DIAGNOSIS — G603 Idiopathic progressive neuropathy: Secondary | ICD-10-CM | POA: Diagnosis not present

## 2015-11-20 DIAGNOSIS — M25561 Pain in right knee: Secondary | ICD-10-CM | POA: Diagnosis not present

## 2015-11-30 DIAGNOSIS — Z7901 Long term (current) use of anticoagulants: Secondary | ICD-10-CM | POA: Diagnosis not present

## 2015-11-30 DIAGNOSIS — I4891 Unspecified atrial fibrillation: Secondary | ICD-10-CM | POA: Diagnosis not present

## 2015-12-12 DIAGNOSIS — Z85828 Personal history of other malignant neoplasm of skin: Secondary | ICD-10-CM | POA: Diagnosis not present

## 2015-12-12 DIAGNOSIS — D485 Neoplasm of uncertain behavior of skin: Secondary | ICD-10-CM | POA: Diagnosis not present

## 2015-12-12 DIAGNOSIS — Z08 Encounter for follow-up examination after completed treatment for malignant neoplasm: Secondary | ICD-10-CM | POA: Diagnosis not present

## 2015-12-12 DIAGNOSIS — B079 Viral wart, unspecified: Secondary | ICD-10-CM | POA: Diagnosis not present

## 2015-12-12 DIAGNOSIS — C44319 Basal cell carcinoma of skin of other parts of face: Secondary | ICD-10-CM | POA: Diagnosis not present

## 2015-12-28 DIAGNOSIS — I482 Chronic atrial fibrillation: Secondary | ICD-10-CM | POA: Diagnosis not present

## 2015-12-28 DIAGNOSIS — Z7901 Long term (current) use of anticoagulants: Secondary | ICD-10-CM | POA: Diagnosis not present

## 2016-01-02 DIAGNOSIS — C4400 Unspecified malignant neoplasm of skin of lip: Secondary | ICD-10-CM | POA: Diagnosis not present

## 2016-01-08 DIAGNOSIS — N390 Urinary tract infection, site not specified: Secondary | ICD-10-CM | POA: Diagnosis not present

## 2016-01-08 DIAGNOSIS — R109 Unspecified abdominal pain: Secondary | ICD-10-CM | POA: Diagnosis not present

## 2016-01-11 ENCOUNTER — Emergency Department (HOSPITAL_COMMUNITY): Payer: Medicare Other

## 2016-01-11 ENCOUNTER — Encounter (HOSPITAL_COMMUNITY): Payer: Self-pay | Admitting: Emergency Medicine

## 2016-01-11 ENCOUNTER — Inpatient Hospital Stay (HOSPITAL_COMMUNITY)
Admission: EM | Admit: 2016-01-11 | Discharge: 2016-01-13 | DRG: 392 | Disposition: A | Discharge status: Home / Self Care | Payer: Medicare Other | Attending: Internal Medicine | Admitting: Internal Medicine

## 2016-01-11 DIAGNOSIS — Z86711 Personal history of pulmonary embolism: Secondary | ICD-10-CM | POA: Diagnosis present

## 2016-01-11 DIAGNOSIS — K5732 Diverticulitis of large intestine without perforation or abscess without bleeding: Principal | ICD-10-CM | POA: Diagnosis present

## 2016-01-11 DIAGNOSIS — N179 Acute kidney failure, unspecified: Secondary | ICD-10-CM | POA: Diagnosis not present

## 2016-01-11 DIAGNOSIS — R102 Pelvic and perineal pain: Secondary | ICD-10-CM | POA: Diagnosis not present

## 2016-01-11 DIAGNOSIS — Z86718 Personal history of other venous thrombosis and embolism: Secondary | ICD-10-CM

## 2016-01-11 DIAGNOSIS — M797 Fibromyalgia: Secondary | ICD-10-CM | POA: Diagnosis present

## 2016-01-11 DIAGNOSIS — E039 Hypothyroidism, unspecified: Secondary | ICD-10-CM | POA: Diagnosis present

## 2016-01-11 DIAGNOSIS — R1030 Lower abdominal pain, unspecified: Secondary | ICD-10-CM

## 2016-01-11 DIAGNOSIS — E860 Dehydration: Secondary | ICD-10-CM | POA: Diagnosis present

## 2016-01-11 DIAGNOSIS — Z7901 Long term (current) use of anticoagulants: Secondary | ICD-10-CM

## 2016-01-11 DIAGNOSIS — R1032 Left lower quadrant pain: Secondary | ICD-10-CM | POA: Diagnosis not present

## 2016-01-11 DIAGNOSIS — Z823 Family history of stroke: Secondary | ICD-10-CM

## 2016-01-11 DIAGNOSIS — Z8711 Personal history of peptic ulcer disease: Secondary | ICD-10-CM

## 2016-01-11 DIAGNOSIS — R1031 Right lower quadrant pain: Secondary | ICD-10-CM | POA: Diagnosis not present

## 2016-01-11 DIAGNOSIS — E119 Type 2 diabetes mellitus without complications: Secondary | ICD-10-CM | POA: Diagnosis not present

## 2016-01-11 DIAGNOSIS — I10 Essential (primary) hypertension: Secondary | ICD-10-CM | POA: Diagnosis present

## 2016-01-11 DIAGNOSIS — K219 Gastro-esophageal reflux disease without esophagitis: Secondary | ICD-10-CM | POA: Diagnosis present

## 2016-01-11 DIAGNOSIS — K5792 Diverticulitis of intestine, part unspecified, without perforation or abscess without bleeding: Secondary | ICD-10-CM | POA: Diagnosis not present

## 2016-01-11 DIAGNOSIS — Z8672 Personal history of thrombophlebitis: Secondary | ICD-10-CM

## 2016-01-11 DIAGNOSIS — E785 Hyperlipidemia, unspecified: Secondary | ICD-10-CM | POA: Diagnosis present

## 2016-01-11 DIAGNOSIS — Z90710 Acquired absence of both cervix and uterus: Secondary | ICD-10-CM

## 2016-01-11 DIAGNOSIS — Z8249 Family history of ischemic heart disease and other diseases of the circulatory system: Secondary | ICD-10-CM

## 2016-01-11 DIAGNOSIS — M041 Periodic fever syndromes: Secondary | ICD-10-CM

## 2016-01-11 DIAGNOSIS — K5900 Constipation, unspecified: Secondary | ICD-10-CM | POA: Diagnosis present

## 2016-01-11 DIAGNOSIS — N390 Urinary tract infection, site not specified: Secondary | ICD-10-CM | POA: Diagnosis not present

## 2016-01-11 LAB — COMPREHENSIVE METABOLIC PANEL
ALT: 14 U/L (ref 14–54)
AST: 22 U/L (ref 15–41)
Albumin: 3.6 g/dL (ref 3.5–5.0)
Alkaline Phosphatase: 48 U/L (ref 38–126)
Anion gap: 9 (ref 5–15)
BUN: 13 mg/dL (ref 6–20)
CO2: 25 mmol/L (ref 22–32)
Calcium: 9.1 mg/dL (ref 8.9–10.3)
Chloride: 103 mmol/L (ref 101–111)
Creatinine, Ser: 1.19 mg/dL — ABNORMAL HIGH (ref 0.44–1.00)
GFR calc Af Amer: 48 mL/min — ABNORMAL LOW (ref >60)
GFR calc non Af Amer: 41 mL/min — ABNORMAL LOW (ref >60)
Glucose, Bld: 125 mg/dL — ABNORMAL HIGH (ref 65–99)
Potassium: 4.1 mmol/L (ref 3.5–5.1)
Sodium: 137 mmol/L (ref 135–145)
Total Bilirubin: 0.8 mg/dL (ref 0.3–1.2)
Total Protein: 6.4 g/dL — ABNORMAL LOW (ref 6.5–8.1)

## 2016-01-11 LAB — CBC WITH DIFFERENTIAL/PLATELET
Basophils Absolute: 0 10*3/uL (ref 0.0–0.1)
Basophils Relative: 0 %
Eosinophils Absolute: 0.1 10*3/uL (ref 0.0–0.7)
Eosinophils Relative: 1 %
HCT: 44.2 % (ref 36.0–46.0)
Hemoglobin: 14.6 g/dL (ref 12.0–15.0)
Lymphocytes Relative: 14 %
Lymphs Abs: 1.4 10*3/uL (ref 0.7–4.0)
MCH: 31.7 pg (ref 26.0–34.0)
MCHC: 33 g/dL (ref 30.0–36.0)
MCV: 95.9 fL (ref 78.0–100.0)
Monocytes Absolute: 0.9 10*3/uL (ref 0.1–1.0)
Monocytes Relative: 9 %
Neutro Abs: 7.2 10*3/uL (ref 1.7–7.7)
Neutrophils Relative %: 76 %
Platelets: 161 10*3/uL (ref 150–400)
RBC: 4.61 MIL/uL (ref 3.87–5.11)
RDW: 13.7 % (ref 11.5–15.5)
WBC: 9.5 10*3/uL (ref 4.0–10.5)

## 2016-01-11 LAB — URINALYSIS, ROUTINE W REFLEX MICROSCOPIC
Bilirubin Urine: NEGATIVE
Glucose, UA: NEGATIVE mg/dL
Hgb urine dipstick: NEGATIVE
Ketones, ur: 15 mg/dL — AB
Nitrite: NEGATIVE
Protein, ur: NEGATIVE mg/dL
Specific Gravity, Urine: 1.018 (ref 1.005–1.030)
pH: 6.5 (ref 5.0–8.0)

## 2016-01-11 LAB — URINE MICROSCOPIC-ADD ON

## 2016-01-11 LAB — PROTIME-INR
INR: 3.41
Prothrombin Time: 35.2 seconds — ABNORMAL HIGH (ref 11.4–15.2)

## 2016-01-11 LAB — I-STAT CG4 LACTIC ACID, ED: Lactic Acid, Venous: 1.28 mmol/L (ref 0.5–1.9)

## 2016-01-11 MED ORDER — PANTOPRAZOLE SODIUM 40 MG PO TBEC
40.0000 mg | DELAYED_RELEASE_TABLET | Freq: Every day | ORAL | Status: DC
  Administered 2016-01-11 – 2016-01-13 (×3): 40 mg via ORAL
  Filled 2016-01-11 (×3): qty 1

## 2016-01-11 MED ORDER — SODIUM CHLORIDE 0.9 % IV SOLN
Freq: Once | INTRAVENOUS | Status: AC
  Administered 2016-01-11: 10:00:00 via INTRAVENOUS

## 2016-01-11 MED ORDER — KCL IN DEXTROSE-NACL 20-5-0.9 MEQ/L-%-% IV SOLN
INTRAVENOUS | Status: DC
  Administered 2016-01-11 – 2016-01-12 (×3): via INTRAVENOUS
  Filled 2016-01-11 (×4): qty 1000

## 2016-01-11 MED ORDER — GABAPENTIN 300 MG PO CAPS
600.0000 mg | ORAL_CAPSULE | Freq: Every day | ORAL | Status: DC
  Administered 2016-01-11 – 2016-01-12 (×2): 600 mg via ORAL
  Filled 2016-01-11 (×2): qty 2

## 2016-01-11 MED ORDER — ACETAMINOPHEN 325 MG PO TABS: 650.0000 mg | ORAL_TABLET | Freq: Four times a day (QID) | ORAL | Status: DC | PRN

## 2016-01-11 MED ORDER — IOPAMIDOL (ISOVUE-300) INJECTION 61%
INTRAVENOUS | Status: AC
  Administered 2016-01-11: 80 mL
  Filled 2016-01-11: qty 100

## 2016-01-11 MED ORDER — PIPERACILLIN-TAZOBACTAM 3.375 G IVPB
3.3750 g | Freq: Three times a day (TID) | INTRAVENOUS | Status: DC
  Administered 2016-01-11 – 2016-01-13 (×5): 3.375 g via INTRAVENOUS
  Filled 2016-01-11 (×9): qty 50

## 2016-01-11 MED ORDER — MORPHINE SULFATE (PF) 2 MG/ML IV SOLN
1.0000 mg | INTRAVENOUS | Status: DC | PRN
  Administered 2016-01-11 (×3): 2 mg via INTRAVENOUS
  Administered 2016-01-11: 1 mg via INTRAVENOUS
  Filled 2016-01-11 (×3): qty 1

## 2016-01-11 MED ORDER — LEVOTHYROXINE SODIUM 75 MCG PO TABS
75.0000 ug | ORAL_TABLET | Freq: Every day | ORAL | Status: DC
  Administered 2016-01-12 – 2016-01-13 (×2): 75 ug via ORAL
  Filled 2016-01-11 (×2): qty 1

## 2016-01-11 MED ORDER — WARFARIN - PHARMACIST DOSING INPATIENT: Freq: Every day | Status: DC

## 2016-01-11 MED ORDER — ONDANSETRON HCL 4 MG/2ML IJ SOLN: 4.0000 mg | Freq: Four times a day (QID) | INTRAMUSCULAR | Status: DC | PRN

## 2016-01-11 MED ORDER — ACETAMINOPHEN 650 MG RE SUPP: 650.0000 mg | Freq: Four times a day (QID) | RECTAL | Status: DC | PRN

## 2016-01-11 MED ORDER — PIPERACILLIN-TAZOBACTAM 3.375 G IVPB 30 MIN
3.3750 g | Freq: Once | INTRAVENOUS | Status: AC
  Administered 2016-01-11: 3.375 g via INTRAVENOUS
  Filled 2016-01-11: qty 50

## 2016-01-11 MED ORDER — SODIUM CHLORIDE 0.9 % IV BOLUS (SEPSIS)
1000.0000 mL | Freq: Once | INTRAVENOUS | Status: AC
  Administered 2016-01-11: 1000 mL via INTRAVENOUS

## 2016-01-11 MED ORDER — ONDANSETRON HCL 4 MG PO TABS: 4.0000 mg | ORAL_TABLET | Freq: Four times a day (QID) | ORAL | Status: DC | PRN

## 2016-01-11 MED ORDER — FENTANYL CITRATE (PF) 100 MCG/2ML IJ SOLN
25.0000 ug | Freq: Once | INTRAMUSCULAR | Status: AC
  Administered 2016-01-11: 25 ug via INTRAVENOUS
  Filled 2016-01-11: qty 2

## 2016-01-11 NOTE — H&P (Signed)
History and Physical    Debbie Richards [id number] DOB: [birth year] DOA: 01/11/2016   PCP: Orpah Melter, MD   Patient coming from/Resides with: Private residence/lives alone  Chief Complaint: Lower abdominal pain for 1 week  HPI: Debbie Richards is a 82 y.o. 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 Patient presents to the hospital after experiencing lower abdominal/suprapubic abdominal pain beginning last Wednesday evening/Thursday morning. This is been associated with anorexia, fevers up to 101F but no chills. She is not had any blood in her stool. She had a diarrhea stool on Sunday but has not have bowel movement since. She did notice increased abdominal pain with eating. She is not had any dysuria.  ED Course:  Vital signs: 99.5-107/95-87-16-RA sats 97% CT abdomen and pelvis with contrast: Sigmoid diverticulitis without evidence of perforation or abscess Lab data: Sodium 137, potassium 4.1, BUN 13, creatinine 1.19, glucose 125, total protein 6.4, lactic acid 1.28, WBCs 9500 with normal differential, hemoglobin 14.6, platelets 161,000; urinalysis abnormal and concerning for UTI with cloudy appearance, rare bacteria, amber colored urine, ketones 15, large amount of leukocytes, WBCs too numerous to count-urine culture and blood cultures obtained in the ER Medications and treatments: Normal saline bolus 1 L, fentanyl 25 g IV 1  Review of Systems:  In addition to the HPI above,  No chills, myalgias or other constitutional symptoms No Headache, changes with Vision or hearing, new weakness, tingling, numbness in any extremity, No problems swallowing food or Liquids, indigestion/reflux No Chest pain, Cough or Shortness of Breath, palpitations, orthopnea or DOE No N/V; no melena or hematochezia, no dark tarry stools No dysuria, hematuria or flank  pain No new skin rashes, lesions, masses or bruises, No new joints pains-aches No recent weight gain or loss No polyuria, polydypsia or polyphagia,   Past Medical History:  Diagnosis Date  . Borderline diabetes   . Chronic lower back pain   . Clotting disorder ([place])   . DVT, lower extremity ([place]) 01/2002; 06/2003   RLE; LLE  . Enlarged thyroid gland   . 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 Fibromyalgia   . Gallstones   . GERD (gastroesophageal reflux disease)   . H/O hiatal hernia   . History of stomach ulcers   . Hyperlipidemia   . Hypertension   . Hypothyroidism   . Migraine    "I've had 4; last time was when I was in my ?30's"  . Osteoarthritis   . Phlebitis    LLE  . Pneumonia   . Pulmonary embolism ([place]) 2003  . Shortness of breath on exertion     Past Surgical History:  Procedure Laterality Date  . ABDOMINAL HYSTERECTOMY  1972  . APPENDECTOMY  1960  . bladder tack    . BREAST BIOPSY     left  . [place]   right  . CATARACT EXTRACTION, BILATERAL  ~ 2011  . CHOLECYSTECTOMY  09/12/2011   Procedure: LAPAROSCOPIC CHOLECYSTECTOMY WITH INTRAOPERATIVE CHOLANGIOGRAM;  Surgeon: Shann Medal, MD;  Location: [facility];  Service: General;  Laterality: N/A;  . excision of goiter    . EXTERNAL EAR SURGERY     x3  . HERNIA REPAIR  1970's   ventral  . MASS EXCISION  12/2001   paravertebral chest mass  . [place]; 1982; after 1982  . mini thoracotomy  12/2001  . THORACOTOMY  2003    Social History   Social History  . Marital status: Widowed    Spouse name: N/A  . Number of children: N/A  . Years of education: N/A   Occupational History  . Not on file.   Social History Main Topics  . Smoking status: Never Smoker  . Smokeless tobacco: Never Used  . Alcohol use No  . Drug use: No  . Sexual activity: No   Other Topics Concern  . Not on file   Social History Narrative  . No narrative on file    Mobility: Without assistive  devices Work history: Retired   No Known Allergies  Family History  Problem Relation Age of Onset  . Heart disease Mother     had CHF  . Stroke Diverticulitis  Father Sister      Prior to Admission medications   Medication Sig Start Date End Date Taking? Authorizing Provider  acetaminophen (TYLENOL) 500 MG tablet Take 500 mg by mouth every 6 (six) hours as needed. For pain   Yes Historical Provider, MD  atenolol (TENORMIN) 25 MG tablet Take 25 mg by mouth daily.   Yes Historical Provider, MD  colchicine 0.6 MG tablet Take 0.6 mg by mouth daily.   Yes Historical Provider, MD  furosemide (LASIX) 20 MG tablet Take 20 mg by mouth.   Yes Historical Provider, MD  gabapentin (NEURONTIN) 300 MG capsule Take 600 mg by mouth at bedtime.    Yes Historical Provider, MD  levothyroxine (SYNTHROID, LEVOTHROID) 75 MCG tablet Take 75 mcg by mouth daily.   Yes Historical Provider, MD  lovastatin (MEVACOR) 20 MG tablet Take 1 tablet by mouth every other day.   Yes Historical Provider, MD  Omega-3 Fatty Acids (FISH OIL) 1000 MG CAPS Take 1 tablet by mouth daily.   Yes Historical Provider, MD  omeprazole (PRILOSEC) 20 MG capsule Take 20 mg by mouth daily.   Yes Historical Provider, MD  warfarin (COUMADIN) 1 MG tablet Take 7-8 mg by mouth daily. Alternating doses. Take 2 tablets (1mg ) along with a 5mg  tablet to make a total of 7mg  one day. The next day take 3 tablets (1mg ) along with 5mg  tablet to make a total dose of 8mg .   Yes Historical Provider, MD  warfarin (COUMADIN) 5 MG tablet Take 7-8 mg by mouth daily. Alternating doses. Take 1 tablet (5mg ) plus two 1mg  tablets to make a total of 7mg  one day. On the next day take 1 tablet (5mg ) and three 1mg  tablets to make a total of 8mg .   Yes Historical Provider, MD    Physical Exam: Vitals:   01/11/16 0915 01/11/16 0945 01/11/16 1000 01/11/16 1015  BP: 135/67 138/69  110/92  Pulse: 85 82  80  Resp:      Temp:      TempSrc:      SpO2: 94% 95%  95%    Weight:   119.7 kg (264 lb)   Height:   5\' 10"  (1.778 m)       Constitutional: NAD, calm, comfortable Eyes: PERRL, lids and conjunctivae normal ENMT: Mucous membranes are moist. Posterior pharynx clear of any exudate or lesions.Normal dentition.  Neck: normal, supple, no masses, no thyromegaly Respiratory: clear to auscultation bilaterally, no wheezing, no crackles. Normal respiratory effort. No accessory muscle use.  Cardiovascular: Regular rate and rhythm, no murmurs / rubs / gallops. No extremity edema. 2+ pedal pulses. No carotid bruits.  Abdomen: Tender to palpation with pain reproducible with palpation over  supraumbilical as well as suprapubic regions and left lower quadrant, this is associated with guarding but not rebounding, no masses palpated. No hepatosplenomegaly. Bowel sounds positive but are hypoactive.  Musculoskeletal: no clubbing / cyanosis. No joint deformity upper and lower extremities. Good ROM, no contractures. Normal muscle tone.  Skin: no rashes, lesions, ulcers. No induration Neurologic: CN 2-12 grossly intact. Sensation intact, DTR normal. Strength 5/5 x all 4 extremities.  Psychiatric: Normal judgment and insight. Alert and oriented x 3. Normal mood.    Labs on Admission: I have personally reviewed following labs and imaging studies  CBC:  Recent Labs Lab 01/11/16 0625  WBC 9.5  NEUTROABS 7.2  HGB 14.6  HCT 44.2  MCV 95.9  PLT [id number]   Basic Metabolic Panel:  Recent Labs Lab 01/11/16 0625  NA 137  K 4.1  CL 103  CO2 25  GLUCOSE 125*  BUN 13  CREATININE 1.19*  CALCIUM 9.1   GFR: Estimated Creatinine Clearance: 50.3 mL/min (by C-G formula based on SCr of 1.19 mg/dL). Liver Function Tests:  Recent Labs Lab 01/11/16 0625  AST 22  ALT 14  ALKPHOS 48  BILITOT 0.8  PROT 6.4*  ALBUMIN 3.6   No results for input(s): LIPASE, AMYLASE in the last 168 hours. No results for input(s): AMMONIA in the last 168 hours. Coagulation Profile: No  results for input(s): INR, PROTIME in the last 168 hours. Cardiac Enzymes: No results for input(s): CKTOTAL, CKMB, CKMBINDEX, TROPONINI in the last 168 hours. BNP (last 3 results) No results for input(s): PROBNP in the last 8760 hours. HbA1C: No results for input(s): HGBA1C in the last 72 hours. CBG: No results for input(s): GLUCAP in the last 168 hours. Lipid Profile: No results for input(s): CHOL, HDL, LDLCALC, TRIG, CHOLHDL, LDLDIRECT in the last 72 hours. Thyroid Function Tests: No results for input(s): TSH, T4TOTAL, FREET4, T3FREE, THYROIDAB in the last 72 hours. Anemia Panel: No results for input(s): VITAMINB12, FOLATE, FERRITIN, TIBC, IRON, RETICCTPCT in the last 72 hours. Urine analysis:    Component Value Date/Time   COLORURINE AMBER (A) 01/11/2016 0837   APPEARANCEUR CLOUDY (A) 01/11/2016 0837   LABSPEC 1.018 01/11/2016 0837   PHURINE 6.5 01/11/2016 0837   GLUCOSEU NEGATIVE 01/11/2016 0837   HGBUR NEGATIVE 01/11/2016 0837   BILIRUBINUR NEGATIVE 01/11/2016 0837   BILIRUBINUR negative 04/01/2015 1146   KETONESUR 15 (A) 01/11/2016 0837   PROTEINUR NEGATIVE 01/11/2016 0837   UROBILINOGEN 0.2 04/01/2015 1146   UROBILINOGEN 0.2 09/09/2011 0558   NITRITE NEGATIVE 01/11/2016 0837   LEUKOCYTESUR LARGE (A) 01/11/2016 0837   Sepsis Labs: @LABRCNTIP (procalcitonin:4,lacticidven:4) )No results found for this or any previous visit (from the past 240 hour(s)).   Radiological Exams on Admission: Ct Abdomen Pelvis W Contrast  Result Date: 01/11/2016 CLINICAL DATA:  Abdominal pain and symptoms of constipation EXAM: CT ABDOMEN AND PELVIS WITH CONTRAST TECHNIQUE: Multidetector CT imaging of the abdomen and pelvis was performed using the standard protocol following bolus administration of intravenous contrast. CONTRAST:  57mL ISOVUE-300 IOPAMIDOL (ISOVUE-300) INJECTION 61% COMPARISON:  09/09/2011, 06/30/2003 FINDINGS: Lower chest: Well-aerated without focal infiltrate. A 6 mm nodule is  noted in the right middle lobe stable from a previous exam from 2005 Hepatobiliary: Gallbladder has been surgically removed. The liver is within normal limits. Pancreas: No mass, inflammatory changes, or other significant abnormality. Spleen: Within normal limits in size and appearance. Adrenals/Urinary Tract: Stable right renal cysts. No calculi or obstructive changes are noted. The bladder is well distended. Stomach/Bowel: The appendix is  been surgically removed. There are changes consistent with diverticulitis in the sigmoid colon. No perforation or focal abscess is identified. Vascular/Lymphatic: No pathologically enlarged lymph nodes. No evidence of abdominal aortic aneurysm. Reproductive: Uterus has been surgically removed. Other: None. Musculoskeletal:  No suspicious bone lesions identified. IMPRESSION: Sigmoid diverticulitis without evidence of perforation or abscess formation. 6 mm right middle lobe nodule stable from 2005. Postsurgical changes as described. Electronically Signed   By: Inez Catalina M.D.   On: 01/11/2016 09:08    Assessment/Plan Principal Problem:   Acute diverticulitis -Patient presents with greater than one week of lower abdominal pain associated with fevers and anorexia with subsequent CT evidence of acute sigmoid diverticulitis -Partial bowel rest with clear liquids only -Pharmacy to dose Zosyn and Flagyl-avoiding fluoroquinolone in setting of concomitant warfarin usage -IV morphine for pain -IV Zofran for nausea -Follow CBC -Monitor response to pain -If not improving in 72 hours may need repeat CT scan  Active Problems:   Acute UTI -Patient has abnormal urinalysis concerning for UTI although this could be reflective of colonic inflammation from diverticulitis causing concurrent changes in bladder wall mimicking UTI -Antibiotics as above    Acute kidney injury/dehydration  -Baseline renal function in 2013: 15/0.80 -Current renal function: 13/1.19 -Gentle IV fluid  hydration -Follow electrolytes -Hold preadmission colchicine    History of pulmonary embolus (PE)/DVT (deep vein thrombosis) -Pharmacy to manage warfarin -Check baseline PT/INR    Mild HTN -Current blood pressure suboptimal in setting of dehydration so holding preadmission Lopressor -Hold preadmission Lasix (has history of chronic bilateral lower extremity edema and does not have history of heart failure)    Fibromyalgia -Continue preadmission Neurontin    Hypothyroidism -Continue Synthroid    HLD (hyperlipidemia) -Since on clear liquids will not continue preadmission Mevacor and omega-3 fatty acids at this juncture      DVT prophylaxis: Warfarin Code Status: Full  Family Communication: No family at bedside  Disposition Plan: Anticipate discharge back to preadmission home environment once medically stable Consults called: None  Admission status: Observation/floor    Armstead Heiland L. ANP-BC [facility] Pager [phone]   If 7PM-7AM, please contact night-coverage [email or web address] Password [email or web address]  01/11/2016, 10:43 AM

## 2016-01-11 NOTE — Progress Notes (Signed)
Pharmacy Antibiotic Note  Debbie Richards is a 82 y.o. female admitted on 01/11/2016 with UTI and diverticulitis.  Pharmacy has been consulted for Zosyn dosing.  Plan: Zosyn 3.375g IV q8h (4 hour infusion).  Height: 5\' 10"  (177.8 cm) Weight: 264 lb (119.7 kg) IBW/kg (Calculated) : 68.5  Temp (24hrs), Avg:99.5 F (37.5 C), Min:99.5 F (37.5 C), Max:99.5 F (37.5 C)   Recent Labs Lab 01/11/16 0625 01/11/16 0739  WBC 9.5  --   CREATININE 1.19*  --   LATICACIDVEN  --  1.28    Estimated Creatinine Clearance: 50.3 mL/min (by C-G formula based on SCr of 1.19 mg/dL).    No Known Allergies  Antimicrobials this admission: Zosyn 8/4 >>   Dose adjustments this admission: n/a  Microbiology results: 8/4 BCx:  8/4 UCx:    Thank you for allowing pharmacy to be a part of this patient's care.  Lyle Leisner D. Elaine Middleton, PharmD, BCPS Clinical Pharmacist Pager: [phone] 01/11/2016 10:19 AM

## 2016-01-11 NOTE — ED Provider Notes (Signed)
[facility] DEPT Provider Note   CSN: [id number] Arrival date & time: 01/11/16  0600  First Provider Contact:  None     History   Chief Complaint Chief Complaint  Patient presents with  . Constipation    HPI LEIANA KESTNER is a 82 y.o. female.  HPI 82 year old female with past medical history of hypertension, hyperlipidemia, diabetes, who presents with a 5 day history of progressively worsening abdominal distention and lower abdominal pain. The patient states that over the last 5 days she has had progressively worsening constipation. She is taking MiraLAX without any relief over the same time. She's had worsening generalized but primarily suprapubic and left lower quadrant abdominal pain. She denies any nausea or vomiting. Of note, she did have a fever to 102 yesterday. 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 Borderline diabetes   . Chronic lower back pain   . Clotting disorder ([place])   . DVT, lower extremity ([place]) 01/2002; 06/2003   RLE; LLE  . Enlarged thyroid gland   . 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 Fibromyalgia   . Gallstones   . GERD (gastroesophageal reflux disease)   . H/O hiatal hernia   . History of stomach ulcers   . Hyperlipidemia   . Hypertension   . Hypothyroidism   . Migraine    "I've had 4; last time was when I was in my ?30's"  . Osteoarthritis   . Phlebitis    LLE  . Pneumonia   . Pulmonary embolism ([place]) 2003  . Shortness of breath on exertion     Patient Active Problem List   Diagnosis Date Noted  . Acute diverticulitis 01/11/2016  . Acute UTI 01/11/2016  . Acute kidney injury ([place]) 01/11/2016  . Hypothyroidism 01/11/2016  . Mild HTN 01/11/2016  . HLD (hyperlipidemia) 01/11/2016  . Biliary colic [id number]  .  Shortness of breath on exertion 09/09/2011  . History of pulmonary embolus (PE) 09/09/2011  . History of DVT (deep vein thrombosis) 09/09/2011  . Anticoagulated on Coumadin 09/09/2011  . Fibromyalgia 09/09/2011  . Goiter 09/09/2011    Past Surgical History:  Procedure Laterality Date  . ABDOMINAL HYSTERECTOMY  1972  . APPENDECTOMY  1960  . bladder tack    . BREAST BIOPSY     left  . [place]   right  . CATARACT EXTRACTION, BILATERAL  ~ 2011  . CHOLECYSTECTOMY  09/12/2011   Procedure: LAPAROSCOPIC CHOLECYSTECTOMY WITH INTRAOPERATIVE CHOLANGIOGRAM;  Surgeon: Shann Medal, MD;  Location: [facility];  Service: General;  Laterality: N/A;  . excision of goiter    . EXTERNAL EAR SURGERY     x3  . HERNIA REPAIR  1970's   ventral  . MASS EXCISION  12/2001   paravertebral chest mass  . [place]; 1982; after 1982  . mini thoracotomy  12/2001  . THORACOTOMY  2003    OB History    No data available       Home Medications    Prior to Admission medications   Medication Sig Start Date End Date Taking? Authorizing Provider  acetaminophen (TYLENOL) 500 MG tablet Take 500 mg by mouth every 6 (six) hours as needed. For pain   Yes Historical Provider, MD  atenolol (TENORMIN) 25 MG tablet Take 25 mg by mouth daily.   Yes Historical Provider, MD  colchicine 0.6 MG tablet Take 0.6 mg by mouth daily.   Yes Historical Provider, MD  furosemide (LASIX) 20 MG tablet Take 20 mg by mouth.   Yes Historical Provider, MD  gabapentin (NEURONTIN) 300 MG capsule Take 600 mg by mouth at bedtime.    Yes Historical Provider, MD  levothyroxine (SYNTHROID, LEVOTHROID) 75 MCG tablet Take 75 mcg by mouth daily.   Yes Historical Provider, MD  lovastatin (MEVACOR) 20 MG tablet Take 1 tablet by mouth every other day.   Yes Historical Provider, MD  Omega-3 Fatty Acids (FISH OIL) 1000 MG CAPS Take 1 tablet by mouth daily.   Yes Historical Provider, MD  omeprazole (PRILOSEC) 20 MG capsule  Take 20 mg by mouth daily.   Yes Historical Provider, MD  warfarin (COUMADIN) 1 MG tablet Take 7-8 mg by mouth daily. Alternating doses. Take 2 tablets (1mg ) along with a 5mg  tablet to make a total of 7mg  one day. The next day take 3 tablets (1mg ) along with 5mg  tablet to make a total dose of 8mg .   Yes Historical Provider, MD  warfarin (COUMADIN) 5 MG tablet Take 7-8 mg by mouth daily. Alternating doses. Take 1 tablet (5mg ) plus two 1mg  tablets to make a total of 7mg  one day. On the next day take 1 tablet (5mg ) and three 1mg  tablets to make a total of 8mg .   Yes Historical Provider, MD    Family History Family History  Problem Relation Age of Onset  . Heart disease Mother     had CHF  . Stroke Father     Social History Social History  Substance Use Topics  . Smoking status: Never Smoker  . Smokeless tobacco: Never Used  . Alcohol use No     Allergies   Review of patient's allergies indicates no known allergies.   Review of Systems Review of Systems  Constitutional: Positive for chills, fatigue and fever.  HENT: Negative for congestion and rhinorrhea.   Eyes: Negative for visual disturbance.  Respiratory: Negative for cough, shortness of breath and wheezing.   Cardiovascular: Negative for chest pain and leg swelling.  Gastrointestinal: Positive for abdominal pain and constipation. Negative for diarrhea, nausea and vomiting.  Genitourinary: Negative for dysuria and flank pain.  Musculoskeletal: Negative for neck pain and neck stiffness.  Skin: Negative for rash and wound.  Allergic/Immunologic: Negative for immunocompromised state.  Neurological: Negative for syncope, weakness and headaches.     Physical Exam Updated Vital Signs BP (!) 143/47   Pulse 88   Temp 98.4 F (36.9 C) (Oral)   Resp 16   Ht 5\' 10"  (1.778 m)   Wt 264 lb (119.7 kg)   SpO2 95%   BMI 37.88 kg/m   Physical Exam  Constitutional: She is oriented to person, place, and time. She appears  well-developed and well-nourished. No distress.  HENT:  Head: Normocephalic and atraumatic.  Mouth/Throat: Oropharynx is clear and moist.  Eyes: Conjunctivae are normal. Pupils are equal, round, and reactive to light.  Neck: Neck supple.  Cardiovascular: Normal rate, regular rhythm and normal heart sounds.  Exam reveals no friction rub.   No murmur heard. Pulmonary/Chest: Effort normal and breath sounds normal. No respiratory distress. She has no wheezes. She has no rales.  Abdominal: She exhibits no distension. There is tenderness in the right lower quadrant, suprapubic area and left lower quadrant. There is rebound and  guarding. There is no rigidity.  Musculoskeletal: She exhibits no edema.  Neurological: She is alert and oriented to person, place, and time. She exhibits normal muscle tone.  Skin: Skin is warm. Capillary refill takes less than 2 seconds.  Nursing note and vitals reviewed.    ED Treatments / Results  Labs (all labs ordered are listed, but only abnormal results are displayed) Labs Reviewed  COMPREHENSIVE METABOLIC PANEL - Abnormal; Notable for the following:       Result Value   Glucose, Bld 125 (*)    Creatinine, Ser 1.19 (*)    Total Protein 6.4 (*)    GFR calc non Af Amer 41 (*)    GFR calc Af Amer 48 (*)    All other components within normal limits  URINALYSIS, ROUTINE W REFLEX MICROSCOPIC (NOT AT [facility]) - Abnormal; Notable for the following:    Color, Urine AMBER (*)    APPearance CLOUDY (*)    Ketones, ur 15 (*)    Leukocytes, UA LARGE (*)    All other components within normal limits  URINE MICROSCOPIC-ADD ON - Abnormal; Notable for the following:    Squamous Epithelial / LPF 0-5 (*)    Bacteria, UA RARE (*)    All other components within normal limits  PROTIME-INR - Abnormal; Notable for the following:    Prothrombin Time 35.2 (*)    All other components within normal limits  CULTURE, BLOOD (ROUTINE X 2)  CULTURE, BLOOD (ROUTINE X 2)  URINE CULTURE    CBC WITH DIFFERENTIAL/PLATELET  PROTIME-INR  CBC  BASIC METABOLIC PANEL  I-STAT CG4 LACTIC ACID, ED    EKG  EKG Interpretation None       Radiology Ct Abdomen Pelvis W Contrast  Result Date: 01/11/2016 CLINICAL DATA:  Abdominal pain and symptoms of constipation EXAM: CT ABDOMEN AND PELVIS WITH CONTRAST TECHNIQUE: Multidetector CT imaging of the abdomen and pelvis was performed using the standard protocol following bolus administration of intravenous contrast. CONTRAST:  5mL ISOVUE-300 IOPAMIDOL (ISOVUE-300) INJECTION 61% COMPARISON:  09/09/2011, 06/30/2003 FINDINGS: Lower chest: Well-aerated without focal infiltrate. A 6 mm nodule is noted in the right middle lobe stable from a previous exam from 2005 Hepatobiliary: Gallbladder has been surgically removed. The liver is within normal limits. Pancreas: No mass, inflammatory changes, or other significant abnormality. Spleen: Within normal limits in size and appearance. Adrenals/Urinary Tract: Stable right renal cysts. No calculi or obstructive changes are noted. The bladder is well distended. Stomach/Bowel: The appendix is been surgically removed. There are changes consistent with diverticulitis in the sigmoid colon. No perforation or focal abscess is identified. Vascular/Lymphatic: No pathologically enlarged lymph nodes. No evidence of abdominal aortic aneurysm. Reproductive: Uterus has been surgically removed. Other: None. Musculoskeletal:  No suspicious bone lesions identified. IMPRESSION: Sigmoid diverticulitis without evidence of perforation or abscess formation. 6 mm right middle lobe nodule stable from 2005. Postsurgical changes as described. Electronically Signed   By: Inez Catalina M.D.   On: 01/11/2016 09:08    Procedures Procedures (including critical care time)  Medications Ordered in ED Medications  dextrose 5 % and 0.9 % NaCl with KCl 20 mEq/L infusion ( Intravenous Transfusing/Transfer 01/11/16 1712)  morphine 2 MG/ML injection  1-2 mg (2 mg Intravenous Given 01/11/16 1819)  piperacillin-tazobactam (ZOSYN) IVPB 3.375 g (0 g Intravenous Hold 01/11/16 1454)  gabapentin (NEURONTIN) capsule 600 mg (not administered)  pantoprazole (PROTONIX) EC tablet 40 mg (40 mg Oral Given 01/11/16 1819)  levothyroxine (SYNTHROID, LEVOTHROID) tablet 75 mcg (not  administered)  acetaminophen (TYLENOL) tablet 650 mg (not administered)    Or  acetaminophen (TYLENOL) suppository 650 mg (not administered)  ondansetron (ZOFRAN) tablet 4 mg (not administered)    Or  ondansetron (ZOFRAN) injection 4 mg (not administered)  Warfarin - Pharmacist Dosing Inpatient ( Does not apply Canceled Entry 01/11/16 1800)  sodium chloride 0.9 % bolus 1,000 mL (0 mLs Intravenous Stopped 01/11/16 1013)  iopamidol (ISOVUE-300) 61 % injection (80 mLs  Contrast Given 01/11/16 0833)  fentaNYL (SUBLIMAZE) injection 25 mcg (25 mcg Intravenous Given 01/11/16 1020)  0.9 %  sodium chloride infusion ( Intravenous Transfusing/Transfer 01/11/16 1712)  piperacillin-tazobactam (ZOSYN) IVPB 3.375 g (0 g Intravenous Stopped 01/11/16 1152)     Initial Impression / Assessment and Plan / ED Course  I have reviewed the triage vital signs and the nursing notes.  Pertinent labs & imaging results that were available during my care of the patient were reviewed by me and considered in my medical decision making (see chart for details).  Clinical Course   82 year old female with past medical history of hypertension, hyperlipidemia, diabetes, familiar mediterranean fever who presents with worsening lower abdominal pain and distention. See history of present illness above. On arrival, vital signs are stable and within normal limits. Examination shows marked lower abdominal tenderness. Labs are reassuring, but given her significant tenderness, CT scan was obtained and is consistent with acute, uncompensated diverticulitis. Symptoms are improved with IV fluids. The patient is suffering persistent pain.  Given her age, comorbidities, including regular colchicine use, and significant pain, will admit for further management. Given patient's difficult to control INR, will give Zosyn, as this is less likely to interact. Medicine has evaluated and is in agreement with this plan.  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

## 2016-01-11 NOTE — ED Triage Notes (Signed)
Pt. reports constipation for several days last BM 5 days ago with low abdominal pain unrelieved by Miralax , denies fever or chills.

## 2016-01-11 NOTE — ED Notes (Signed)
RN unable to take report at this time.  Bed being reassigned but current bed is dirty.

## 2016-01-11 NOTE — Progress Notes (Signed)
ANTICOAGULATION CONSULT NOTE - Initial Consult  Pharmacy Consult for warfarin Indication: hx DVT/PE   No Known Allergies  Patient Measurements: Height: 5\' 10"  (177.8 cm) Weight: 264 lb (119.7 kg) IBW/kg (Calculated) : 68.5   Vital Signs: Temp: 99.5 F (37.5 C) (08/04 0616) Temp Source: Oral (08/04 0616) BP: 122/66 (08/04 1230) Pulse Rate: 78 (08/04 1230)  Labs:  Recent Labs  01/11/16 0625 01/11/16 1313  HGB 14.6  --   HCT 44.2  --   PLT 161  --   LABPROT  --  35.2*  INR  --  3.41  CREATININE 1.19*  --     Estimated Creatinine Clearance: 50.3 mL/min (by C-G formula based on SCr of 1.19 mg/dL).  Assessment: 53 YOF on warfarin PTA for history of DVT/PE and clotting disorder. Home dose is 7mg  and 8mg  on alternating days. Did not take dose this morning. Admit INR 3.41. Hgb 14.6, plts 161- no bleeding noted.   Goal of Therapy:  INR 2-3 Monitor platelets by anticoagulation protocol: Yes   Plan:  -hold warfarin tonight with elevated INR -daily INR and CBC -follow for s/s bleeding and plans for antibiotics  Mayumi Summerson D. Kwamaine Cuppett, PharmD, BCPS Clinical Pharmacist Pager: [phone] 01/11/2016 2:04 PM

## 2016-01-11 NOTE — ED Notes (Signed)
Assisted patient with bedpan. And repositioned in bed.

## 2016-01-11 NOTE — ED Notes (Signed)
2nd attempt of report given to 5W, RN unable to take report due to dirty bed.  Charge RN made aware.

## 2016-01-12 ENCOUNTER — Encounter (HOSPITAL_COMMUNITY): Payer: Self-pay | Admitting: *Deleted

## 2016-01-12 DIAGNOSIS — Z7901 Long term (current) use of anticoagulants: Secondary | ICD-10-CM | POA: Diagnosis not present

## 2016-01-12 DIAGNOSIS — K5732 Diverticulitis of large intestine without perforation or abscess without bleeding: Secondary | ICD-10-CM | POA: Diagnosis present

## 2016-01-12 DIAGNOSIS — E860 Dehydration: Secondary | ICD-10-CM | POA: Diagnosis present

## 2016-01-12 DIAGNOSIS — Z8672 Personal history of thrombophlebitis: Secondary | ICD-10-CM | POA: Diagnosis not present

## 2016-01-12 DIAGNOSIS — N179 Acute kidney failure, unspecified: Secondary | ICD-10-CM | POA: Diagnosis present

## 2016-01-12 DIAGNOSIS — K5792 Diverticulitis of intestine, part unspecified, without perforation or abscess without bleeding: Secondary | ICD-10-CM | POA: Diagnosis not present

## 2016-01-12 DIAGNOSIS — Z86718 Personal history of other venous thrombosis and embolism: Secondary | ICD-10-CM | POA: Diagnosis not present

## 2016-01-12 DIAGNOSIS — M797 Fibromyalgia: Secondary | ICD-10-CM | POA: Diagnosis present

## 2016-01-12 DIAGNOSIS — Z86711 Personal history of pulmonary embolism: Secondary | ICD-10-CM | POA: Diagnosis not present

## 2016-01-12 DIAGNOSIS — Z8249 Family history of ischemic heart disease and other diseases of the circulatory system: Secondary | ICD-10-CM | POA: Diagnosis not present

## 2016-01-12 DIAGNOSIS — K5900 Constipation, unspecified: Secondary | ICD-10-CM | POA: Diagnosis present

## 2016-01-12 DIAGNOSIS — N390 Urinary tract infection, site not specified: Secondary | ICD-10-CM | POA: Diagnosis present

## 2016-01-12 DIAGNOSIS — K219 Gastro-esophageal reflux disease without esophagitis: Secondary | ICD-10-CM | POA: Diagnosis present

## 2016-01-12 DIAGNOSIS — E039 Hypothyroidism, unspecified: Secondary | ICD-10-CM | POA: Diagnosis present

## 2016-01-12 DIAGNOSIS — Z90710 Acquired absence of both cervix and uterus: Secondary | ICD-10-CM | POA: Diagnosis not present

## 2016-01-12 DIAGNOSIS — Z8711 Personal history of peptic ulcer disease: Secondary | ICD-10-CM | POA: Diagnosis not present

## 2016-01-12 DIAGNOSIS — E119 Type 2 diabetes mellitus without complications: Secondary | ICD-10-CM | POA: Diagnosis present

## 2016-01-12 DIAGNOSIS — R102 Pelvic and perineal pain: Secondary | ICD-10-CM | POA: Diagnosis not present

## 2016-01-12 DIAGNOSIS — I10 Essential (primary) hypertension: Secondary | ICD-10-CM | POA: Diagnosis present

## 2016-01-12 DIAGNOSIS — Z823 Family history of stroke: Secondary | ICD-10-CM | POA: Diagnosis not present

## 2016-01-12 DIAGNOSIS — E785 Hyperlipidemia, unspecified: Secondary | ICD-10-CM | POA: Diagnosis present

## 2016-01-12 LAB — BASIC METABOLIC PANEL
Anion gap: 5 (ref 5–15)
BUN: 7 mg/dL (ref 6–20)
CO2: 26 mmol/L (ref 22–32)
Calcium: 8.5 mg/dL — ABNORMAL LOW (ref 8.9–10.3)
Chloride: 109 mmol/L (ref 101–111)
Creatinine, Ser: 0.88 mg/dL (ref 0.44–1.00)
GFR calc Af Amer: >60 mL/min (ref >60)
GFR calc non Af Amer: 59 mL/min — ABNORMAL LOW (ref >60)
Glucose, Bld: 136 mg/dL — ABNORMAL HIGH (ref 65–99)
Potassium: 5 mmol/L (ref 3.5–5.1)
Sodium: 140 mmol/L (ref 135–145)

## 2016-01-12 LAB — PROTIME-INR
INR: 3.75
Prothrombin Time: 38 seconds — ABNORMAL HIGH (ref 11.4–15.2)

## 2016-01-12 LAB — CBC
HCT: 38.9 % (ref 36.0–46.0)
Hemoglobin: 12.3 g/dL (ref 12.0–15.0)
MCH: 31.1 pg (ref 26.0–34.0)
MCHC: 31.6 g/dL (ref 30.0–36.0)
MCV: 98.2 fL (ref 78.0–100.0)
Platelets: 134 10*3/uL — ABNORMAL LOW (ref 150–400)
RBC: 3.96 MIL/uL (ref 3.87–5.11)
RDW: 13.9 % (ref 11.5–15.5)
WBC: 7 10*3/uL (ref 4.0–10.5)

## 2016-01-12 LAB — URINE CULTURE: Culture: <10000 — AB

## 2016-01-12 MED ORDER — SODIUM CHLORIDE 0.9 % IV SOLN
INTRAVENOUS | Status: DC
  Administered 2016-01-12: 16:00:00 via INTRAVENOUS

## 2016-01-12 MED ORDER — TRAMADOL HCL 50 MG PO TABS: 50.0000 mg | ORAL_TABLET | Freq: Four times a day (QID) | ORAL | Status: DC | PRN

## 2016-01-12 NOTE — Progress Notes (Signed)
PROGRESS NOTE    KUM BARNWELL  [id number] DOB: 1931/04/15 DOA: 01/11/2016 PCP: Orpah Melter, MD    Brief Narrative: Debbie Richards is a 82 y.o. 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 Patient presents to the hospital after experiencing lower abdominal/suprapubic abdominal pain beginning last Wednesday evening/Thursday morning. This is been associated with anorexia, fevers up to 101F but no chills. She is not had any blood in her stool. She had a diarrhea stool on Sunday but has not have bowel movement since. She did notice increased abdominal pain with eating. She is not had any dysuria. CT abdomen and pelvis with contrast: Sigmoid diverticulitis without evidence of perforation or abscess  Assessment & Plan:   Principal Problem:   Acute diverticulitis Active Problems:   History of pulmonary embolus (PE)   History of DVT (deep vein thrombosis)   Fibromyalgia   Acute UTI   Acute kidney injury (H[facility])   Hypothyroidism   Mild HTN   HLD (hyperlipidemia)   Acute diverticulitis -Patient presents with greater than one week of lower abdominal pain associated with fevers and anorexia with subsequent CT evidence of acute sigmoid diverticulitis -pain better, tolerating clear. Had BM this AM.  -advanced diet to full liquid.  -continue with  Zosyn  -IV morphine for pain, tramadol PRN     Acute UTI -Patient has abnormal urinalysis concerning for UTI -Antibiotics as above -follow urine culture.     Acute kidney injury/dehydration  -Baseline renal function in 2013: 15/0.80 -Current renal function: 13/1.19 -Gentle IV fluid hydration -improved.     History of pulmonary embolus (PE)/DVT (deep vein thrombosis) -Pharmacy to manage warfarin    Mild HTN -Hold preadmission Lasix (has history of chronic bilateral lower extremity edema and does not  have history of heart failure)    Fibromyalgia -Continue preadmission Neurontin    Hypothyroidism -Continue Synthroid    HLD (hyperlipidemia) -resume medications at discharge       DVT prophylaxis: on coumadin  Code Status: full code.  Family Communication: care discussed with patient  Disposition Plan: home in 48 hours.    Consultants:   none  Procedures:   none  Antimicrobials;   Zosyn  8-04   Subjective: She is feeling better, lower quadrant pain better. Some pain when she urinates.   Objective: Vitals:   01/12/16 0300 01/12/16 0427 01/12/16 0554 01/12/16 1338  BP:  (!) 101/28 (!) 104/44 (!) 132/53  Pulse:  79 68 85  Resp:  19  18  Temp:  98.4 F (36.9 C)  98.1 F (36.7 C)  TempSrc:    Oral  SpO2:  94%  98%  Weight: 119.3 kg (263 lb)     Height:        Intake/Output Summary (Last 24 hours) at 01/12/16 1346 Last data filed at 01/12/16 1311  Gross per 24 hour  Intake          2303.33 ml  Output              700 ml  Net          16 03.33 ml   Filed Weights   01/11/16 1000 01/12/16 0300  Weight: 119.7 kg (264 lb) 119.3 kg (263 lb)    Examination:  General exam: Appears calm and comfortable  Respiratory system: Clear to auscultation. Respiratory effort normal. Cardiovascular system: S1 & S2 heard, RRR. No JVD, murmurs, rubs, gallops or  clicks. No pedal edema. Gastrointestinal system: Abdomen is nondistended, soft and mild tender. No organomegaly or masses felt. Normal bowel sounds heard. Central nervous system: Alert and oriented. No focal neurological deficits. Extremities: Symmetric 5 x 5 power. Skin: No rashes, lesions or ulcers Psychiatry: Judgement and insight appear normal. Mood & affect appropriate.     Data Reviewed: I have personally reviewed following labs and imaging studies  CBC:  Recent Labs Lab 01/11/16 0625 01/12/16 0440  WBC 9.5 7.0  NEUTROABS 7.2  --   HGB 14.6 12.3  HCT 44.2 38.9  MCV 95.9 98.2  PLT 161 134*     Basic Metabolic Panel:  Recent Labs Lab 01/11/16 0625 01/12/16 0440  NA 137 140  K 4.1 5.0  CL 103 109  CO2 25 26  GLUCOSE 125* 136*  BUN 13 7  CREATININE 1.19* 0.88  CALCIUM 9.1 8.5*   GFR: Estimated Creatinine Clearance: 67.9 mL/min (by C-G formula based on SCr of 0.88 mg/dL). Liver Function Tests:  Recent Labs Lab 01/11/16 0625  AST 22  ALT 14  ALKPHOS 48  BILITOT 0.8  PROT 6.4*  ALBUMIN 3.6   No results for input(s): LIPASE, AMYLASE in the last 168 hours. No results for input(s): AMMONIA in the last 168 hours. Coagulation Profile:  Recent Labs Lab 01/11/16 1313 01/12/16 0440  INR 3.41 3.75   Cardiac Enzymes: No results for input(s): CKTOTAL, CKMB, CKMBINDEX, TROPONINI in the last 168 hours. BNP (last 3 results) No results for input(s): PROBNP in the last 8760 hours. HbA1C: No results for input(s): HGBA1C in the last 72 hours. CBG: No results for input(s): GLUCAP in the last 168 hours. Lipid Profile: No results for input(s): CHOL, HDL, LDLCALC, TRIG, CHOLHDL, LDLDIRECT in the last 72 hours. Thyroid Function Tests: No results for input(s): TSH, T4TOTAL, FREET4, T3FREE, THYROIDAB in the last 72 hours. Anemia Panel: No results for input(s): VITAMINB12, FOLATE, FERRITIN, TIBC, IRON, RETICCTPCT in the last 72 hours. Sepsis Labs:  Recent Labs Lab 01/11/16 0739  LATICACIDVEN 1.28    Recent Results (from the past 240 hour(s))  Urine culture     Status: Abnormal   Collection Time: 01/11/16  8:37 AM  Result Value Ref Range Status   Specimen Description URINE, CLEAN CATCH  Final   Special Requests NONE  Final   Culture <10,000 COLONIES/mL INSIGNIFICANT GROWTH (A)  Final   Report Status 01/12/2016 FINAL  Final  Blood culture (routine x 2)     Status: None (Preliminary result)   Collection Time: 01/11/16 10:20 AM  Result Value Ref Range Status   Specimen Description BLOOD RIGHT ANTECUBITAL  Final   Special Requests BOTTLES DRAWN AEROBIC AND ANAEROBIC   5CC  Final   Culture NO GROWTH < 12 HOURS  Final   Report Status PENDING  Incomplete  Blood culture (routine x 2)     Status: None (Preliminary result)   Collection Time: 01/11/16 10:34 AM  Result Value Ref Range Status   Specimen Description BLOOD RIGHT HAND  Final   Special Requests BOTTLES DRAWN AEROBIC AND ANAEROBIC  5CC  Final   Culture NO GROWTH < 12 HOURS  Final   Report Status PENDING  Incomplete         Radiology Studies: Ct Abdomen Pelvis W Contrast  Result Date: 01/11/2016 CLINICAL DATA:  Abdominal pain and symptoms of constipation EXAM: CT ABDOMEN AND PELVIS WITH CONTRAST TECHNIQUE: Multidetector CT imaging of the abdomen and pelvis was performed using the standard protocol following bolus  administration of intravenous contrast. CONTRAST:  19mL ISOVUE-300 IOPAMIDOL (ISOVUE-300) INJECTION 61% COMPARISON:  09/09/2011, 06/30/2003 FINDINGS: Lower chest: Well-aerated without focal infiltrate. A 6 mm nodule is noted in the right middle lobe stable from a previous exam from 2005 Hepatobiliary: Gallbladder has been surgically removed. The liver is within normal limits. Pancreas: No mass, inflammatory changes, or other significant abnormality. Spleen: Within normal limits in size and appearance. Adrenals/Urinary Tract: Stable right renal cysts. No calculi or obstructive changes are noted. The bladder is well distended. Stomach/Bowel: The appendix is been surgically removed. There are changes consistent with diverticulitis in the sigmoid colon. No perforation or focal abscess is identified. Vascular/Lymphatic: No pathologically enlarged lymph nodes. No evidence of abdominal aortic aneurysm. Reproductive: Uterus has been surgically removed. Other: None. Musculoskeletal:  No suspicious bone lesions identified. IMPRESSION: Sigmoid diverticulitis without evidence of perforation or abscess formation. 6 mm right middle lobe nodule stable from 2005. Postsurgical changes as described. Electronically  Signed   By: Inez Catalina M.D.   On: 01/11/2016 09:08        Scheduled Meds: . gabapentin  600 mg Oral QHS  . levothyroxine  75 mcg Oral QAC breakfast  . pantoprazole  40 mg Oral Daily  . piperacillin-tazobactam (ZOSYN)  IV  3.375 g Intravenous Q8H  . Warfarin - Pharmacist Dosing Inpatient   Does not apply q1800   Continuous Infusions: . dextrose 5 % and 0.9 % NaCl with KCl 20 mEq/L 100 mL/hr at 01/12/16 0515     LOS: 0 days    Time spent: 35 minutes.     Elmarie Shiley, MD [facility] Pager [phone]  If 7PM-7AM, please contact night-coverage [email or web address] Password [email or web address] 01/12/2016, 1:46 PM

## 2016-01-12 NOTE — Progress Notes (Addendum)
   01/12/16 0554  Vitals  BP (!) 104/44  MAP (mmHg) (!) 59  BP Location Left Arm  BP Method Automatic  Patient Position (if appropriate) Lying  Pulse Rate 68    Notified MD Eulas Post, Awaiting orders  0607- MD Eulas Post states that since the pt doesn't have any BP medication due to just monitor for now

## 2016-01-12 NOTE — Progress Notes (Signed)
ANTICOAGULATION CONSULT NOTE - Initial Consult  Pharmacy Consult for warfarin Indication: hx DVT/PE   No Known Allergies  Patient Measurements: Height: 5\' 10"  (177.8 cm) Weight: 263 lb (119.3 kg) IBW/kg (Calculated) : 68.5   Vital Signs: Temp: 98.4 F (36.9 C) (08/05 0427) Temp Source: Oral (08/04 2357) BP: 104/44 (08/05 0554) Pulse Rate: 68 (08/05 0554)  Labs:  Recent Labs  01/11/16 0625 01/11/16 1313 01/12/16 0440  HGB 14.6  --  12.3  HCT 44.2  --  38.9  PLT 161  --  134*  LABPROT  --  35.2* 38.0*  INR  --  3.41 3.75  CREATININE 1.19*  --  0.88    Estimated Creatinine Clearance: 67.9 mL/min (by C-G formula based on SCr of 0.88 mg/dL).  Assessment: 61 YOF on warfarin PTA for history of DVT/PE and clotting disorder. Home dose is 7mg  and 8mg  on alternating days. Did not take dose this morning. Admit INR 3.41. INR trended up to 3.75 after dose was held yesterday.   8/4 zosyn>>  8/4 Blood cx>>NGTD 8/4 Urine cx>> NGTD  Goal of Therapy:  INR 2-3 Monitor platelets by anticoagulation protocol: Yes   Plan:   No coumadin today Daily INR and CBC Follow for s/s bleeding and plans for antibiotics  Onnie Boer, PharmD Pager: [phone] 01/12/2016 8:07 AM

## 2016-01-13 ENCOUNTER — Encounter (HOSPITAL_COMMUNITY): Payer: Self-pay | Admitting: *Deleted

## 2016-01-13 LAB — CBC
HCT: 39.8 % (ref 36.0–46.0)
Hemoglobin: 12.7 g/dL (ref 12.0–15.0)
MCH: 31.1 pg (ref 26.0–34.0)
MCHC: 31.9 g/dL (ref 30.0–36.0)
MCV: 97.3 fL (ref 78.0–100.0)
Platelets: 142 10*3/uL — ABNORMAL LOW (ref 150–400)
RBC: 4.09 MIL/uL (ref 3.87–5.11)
RDW: 13.5 % (ref 11.5–15.5)
WBC: 4 10*3/uL (ref 4.0–10.5)

## 2016-01-13 LAB — BASIC METABOLIC PANEL
Anion gap: 6 (ref 5–15)
BUN: 5 mg/dL — ABNORMAL LOW (ref 6–20)
CO2: 28 mmol/L (ref 22–32)
Calcium: 8.8 mg/dL — ABNORMAL LOW (ref 8.9–10.3)
Chloride: 108 mmol/L (ref 101–111)
Creatinine, Ser: 0.77 mg/dL (ref 0.44–1.00)
GFR calc Af Amer: >60 mL/min (ref >60)
GFR calc non Af Amer: >60 mL/min (ref >60)
Glucose, Bld: 114 mg/dL — ABNORMAL HIGH (ref 65–99)
Potassium: 4.1 mmol/L (ref 3.5–5.1)
Sodium: 142 mmol/L (ref 135–145)

## 2016-01-13 LAB — PROTIME-INR
INR: 3.05
Prothrombin Time: 32.2 seconds — ABNORMAL HIGH (ref 11.4–15.2)

## 2016-01-13 MED ORDER — WARFARIN SODIUM 1 MG PO TABS: 2.0000 mg | ORAL_TABLET | Freq: Once | ORAL | 0 refills | Status: DC

## 2016-01-13 MED ORDER — AMOXICILLIN-POT CLAVULANATE 875-125 MG PO TABS
1.0000 | ORAL_TABLET | Freq: Two times a day (BID) | ORAL | 0 refills | Status: DC
Start: 2016-01-13 — End: 2017-02-02

## 2016-01-13 MED ORDER — WARFARIN SODIUM 2 MG PO TABS: 2.0000 mg | ORAL_TABLET | Freq: Once | ORAL | Status: DC

## 2016-01-13 NOTE — Progress Notes (Signed)
ANTICOAGULATION CONSULT NOTE - Initial Consult  Pharmacy Consult for warfarin Indication: hx DVT/PE   No Known Allergies  Patient Measurements: Height: 5\' 10"  (177.8 cm) Weight: 263 lb (119.3 kg) IBW/kg (Calculated) : 68.5   Vital Signs: Temp: 97.6 F (36.4 C) (08/06 0614) Temp Source: Oral (08/06 0614) BP: 127/52 (08/06 0614) Pulse Rate: 75 (08/06 0614)  Labs:  Recent Labs  01/11/16 0625 01/11/16 1313 01/12/16 0440 01/13/16 0727  HGB 14.6  --  12.3 12.7  HCT 44.2  --  38.9 39.8  PLT 161  --  134* 142*  LABPROT  --  35.2* 38.0* 32.2*  INR  --  3.41 3.75 3.05  CREATININE 1.19*  --  0.88 0.77    Estimated Creatinine Clearance: 74.7 mL/min (by C-G formula based on SCr of 0.8 mg/dL).  Assessment: 56 YOF on warfarin PTA for history of DVT/PE and clotting disorder. Home dose is 7mg  and 8mg  on alternating days. Did not take dose this morning. Admit INR 3.41. INR 3.75>3.05 after dose was held yesterday. Due to precipitous fall of INR after holding two doses, will administer a small dose of warfarin tonight to blunt fall and ease INR into range of 2-3.   Zosyn 8/4>>  8/4 BCx: NGTD 8/4 urine: <10,000 CFU/mL  Goal of Therapy:  INR 2-3 Monitor platelets by anticoagulation protocol: Yes   Plan:  Warfarin 2mg  tonight x1 Zosyn 3.375 IV 18h Daily INR and CBC Follow for s/s bleeding and plans for antibiotics  Dierdre Harness, Cain Sieve, PharmD Clinical Pharmacy Resident [phone] (Pager) 01/13/2016 9:44 AM

## 2016-01-13 NOTE — Progress Notes (Signed)
Patient given discharge instructions.  Patient verbalized understanding of discharge instructions.  Patient signed discharge instructions.  PIV removed area clean, dry, and intact.   Discharge vitals Vitals:   01/12/16 2124 01/13/16 0614  BP: 127/60 (!) 127/52  Pulse: 79 75  Resp: 18 18  Temp: 98.3 F (36.8 C) 97.6 F (36.4 C)   Discharge medications   Medication List    TAKE these medications   acetaminophen 500 MG tablet Commonly known as:  TYLENOL Take 500 mg by mouth every 6 (six) hours as needed. For pain   amoxicillin-clavulanate 875-125 MG tablet Commonly known as:  AUGMENTIN Take 1 tablet by mouth 2 (two) times daily.   atenolol 25 MG tablet Commonly known as:  TENORMIN Take 25 mg by mouth daily.   colchicine 0.6 MG tablet Take 0.6 mg by mouth daily.   Fish Oil 1000 MG Caps Take 1 tablet by mouth daily.   furosemide 20 MG tablet Commonly known as:  LASIX Take 20 mg by mouth.   gabapentin 300 MG capsule Commonly known as:  NEURONTIN Take 600 mg by mouth at bedtime.   levothyroxine 75 MCG tablet Commonly known as:  SYNTHROID, LEVOTHROID Take 75 mcg by mouth daily.   lovastatin 20 MG tablet Commonly known as:  MEVACOR Take 1 tablet by mouth every other day.   omeprazole 20 MG capsule Commonly known as:  PRILOSEC Take 20 mg by mouth daily.   warfarin 1 MG tablet Commonly known as:  COUMADIN Take 2 tablets (2 mg total) by mouth one time only at 6 PM. You need INR check tomorrow 8-07, and coumadin dose need to be adjusted as needed. What changed:  how much to take  when to take this  additional instructions  Another medication with the same name was removed. Continue taking this medication, and follow the directions you see here.      Patient waiting on her ride home.

## 2016-01-13 NOTE — Progress Notes (Signed)
Pt  Iv line at the rt [facility] got  infilterated iv team was paged for another line at the left upper arm and got infilerated again couple of hour after it was inserted seen during my hourly rounds, left upper arm  looks very bad with fluid in the tissue and bruise ice pack applied to the side, will continue to monitor

## 2016-01-13 NOTE — Discharge Summary (Signed)
Physician Discharge Summary  Debbie Richards [id number] DOB: 10/28/1930 DOA: 01/11/2016  PCP: Orpah Melter, MD  Admit date: 01/11/2016 Discharge date: 01/13/2016  Admitted From: home  Disposition:  Home   Recommendations for Outpatient Follow-up:  1. Follow up with PCP in 1-2 weeks 2. Please obtain BMP/CBC in one week 3. Need INR on Monday, adjust coumadin as needed, notice patient on antibiotics for diverticulitis.    Discharge Condition: stable.  CODE STATUS: Full code.  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 Patient presents to the hospital after experiencing lower abdominal/suprapubic abdominal pain beginning last Wednesday evening/Thursday morning. This is been associated with anorexia,fevers up to 101F but no chills. She is not had any blood in her stool. She had a diarrhea stool on Sunday but has not have bowel movement since. She did notice increased abdominal pain with eating. She is not had any dysuria. CT abdomen and pelvis with contrast:Sigmoid diverticulitis without evidence of perforation or abscess  Assessment & Plan: Acute diverticulitis -Patient presents with greater than one week of lower abdominal pain associated with fevers and anorexia with subsequent CT evidence of acute sigmoid diverticulitis -pain better, tolerating clear. Had BM this AM.  -advanced diet to full liquid.  -treated with  Zosyn , discharge on 7 days of Augmentin  -IV morphine for pain, tramadol PRN  pain resolved, tolerating diet   Acute UTI -Patient has abnormal urinalysis concerning for UTI -Antibiotics as above - urine culture. No significant growth   Acute kidney injury/dehydration -Baseline renal function in 2013: 15/0.80 -Current  renal function: 13/1.19 -Gentle IV fluid hydration -improved.   History of pulmonary embolus (PE)/DVT (deep vein thrombosis) -Pharmacy to manage warfarin -discharge on coumadin 2 mg tonight. patient has appointment 8-07 for INR. Advised to keep that appointment. Coumadin dose depending on INR level. Notice patient now on antibiotics.   Mild HTN -Hold preadmission Lasix (has history of chronic bilateral lower extremity edema and does not have history of heart failure)  Fibromyalgia -Continue preadmission Neurontin  Hypothyroidism -Continue Synthroid  HLD (hyperlipidemia) -resume medications at discharge      Discharge Diagnoses:  Principal Problem:   Acute diverticulitis Active Problems:   History of pulmonary embolus (PE)   History of DVT (deep vein thrombosis)   Fibromyalgia   Acute UTI   Acute kidney injury (HCC)   Hypothyroidism   Mild HTN   HLD (hyperlipidemia)    Discharge Instructions  Discharge Instructions    Diet - low sodium heart healthy    Complete by:  As directed   Increase activity slowly    Complete by:  As directed       Medication List    TAKE these medications   acetaminophen 500 MG tablet Commonly known as:  TYLENOL Take 500 mg by mouth every 6 (six) hours as needed. For pain   amoxicillin-clavulanate 875-125 MG tablet Commonly known as:  AUGMENTIN Take 1 tablet by mouth 2 (two) times daily.   atenolol 25 MG tablet Commonly known as:  TENORMIN Take 25 mg by mouth daily.   colchicine 0.6 MG tablet Take 0.6 mg by mouth daily.   Fish Oil 1000 MG Caps Take 1 tablet by mouth daily.   furosemide 20 MG tablet Commonly known as:  LASIX Take 20 mg by mouth.   gabapentin 300 MG capsule  Commonly known as:  NEURONTIN Take 600 mg by mouth at bedtime.   levothyroxine 75 MCG tablet Commonly known as:  SYNTHROID, LEVOTHROID Take 75 mcg by mouth daily.   lovastatin 20 MG tablet Commonly known as:  MEVACOR Take 1  tablet by mouth every other day.   omeprazole 20 MG capsule Commonly known as:  PRILOSEC Take 20 mg by mouth daily.   warfarin 1 MG tablet Commonly known as:  COUMADIN Take 2 tablets (2 mg total) by mouth one time only at 6 PM. You need INR check tomorrow 8-07, and coumadin dose need to be adjusted as needed. What changed:  how much to take  when to take this  additional instructions  Another medication with the same name was removed. Continue taking this medication, and follow the directions you see here.       No Known Allergies  Consultations: none  Procedures/Studies: Ct Abdomen Pelvis W Contrast  Result Date: 01/11/2016 CLINICAL DATA:  Abdominal pain and symptoms of constipation EXAM: CT ABDOMEN AND PELVIS WITH CONTRAST TECHNIQUE: Multidetector CT imaging of the abdomen and pelvis was performed using the standard protocol following bolus administration of intravenous contrast. CONTRAST:  54mL ISOVUE-300 IOPAMIDOL (ISOVUE-300) INJECTION 61% COMPARISON:  09/09/2011, 06/30/2003 FINDINGS: Lower chest: Well-aerated without focal infiltrate. A 6 mm nodule is noted in the right middle lobe stable from a previous exam from 2005 Hepatobiliary: Gallbladder has been surgically removed. The liver is within normal limits. Pancreas: No mass, inflammatory changes, or other significant abnormality. Spleen: Within normal limits in size and appearance. Adrenals/Urinary Tract: Stable right renal cysts. No calculi or obstructive changes are noted. The bladder is well distended. Stomach/Bowel: The appendix is been surgically removed. There are changes consistent with diverticulitis in the sigmoid colon. No perforation or focal abscess is identified. Vascular/Lymphatic: No pathologically enlarged lymph nodes. No evidence of abdominal aortic aneurysm. Reproductive: Uterus has been surgically removed. Other: None. Musculoskeletal:  No suspicious bone lesions identified. IMPRESSION: Sigmoid diverticulitis  without evidence of perforation or abscess formation. 6 mm right middle lobe nodule stable from 2005. Postsurgical changes as described. Electronically Signed   By: Inez Catalina M.D.   On: 01/11/2016 09:08     Subjective: Feeling better, denies abdominal pain. Had BM this am   Discharge Exam: Vitals:   01/12/16 2124 01/13/16 0614  BP: 127/60 (!) 127/52  Pulse: 79 75  Resp: 18 18  Temp: 98.3 F (36.8 C) 97.6 F (36.4 C)   Vitals:   01/12/16 0554 01/12/16 1338 01/12/16 2124 01/13/16 0614  BP: (!) 104/44 (!) 132/53 127/60 (!) 127/52  Pulse: 68 85 79 75  Resp:  18 18 18   Temp:  98.1 F (36.7 C) 98.3 F (36.8 C) 97.6 F (36.4 C)  TempSrc:  Oral Oral Oral  SpO2:  98% 98% 92%  Weight:      Height:        General: Pt is alert, awake, not in acute distress Cardiovascular: RRR, S1/S2 +, no rubs, no gallops Respiratory: CTA bilaterally, no wheezing, no rhonchi Abdominal: Soft, NT, ND, bowel sounds + Extremities: no edema, no cyanosis    The results of significant diagnostics from this hospitalization (including imaging, microbiology, ancillary and laboratory) are listed below for reference.     Microbiology: Recent Results (from the past 240 hour(s))  Urine culture     Status: Abnormal   Collection Time: 01/11/16  8:37 AM  Result Value Ref Range Status   Specimen Description URINE, CLEAN CATCH  Final   Special Requests NONE  Final   Culture <10,000 COLONIES/mL INSIGNIFICANT GROWTH (A)  Final   Report Status 01/12/2016 FINAL  Final  Blood culture (routine x 2)     Status: None (Preliminary result)   Collection Time: 01/11/16 10:20 AM  Result Value Ref Range Status   Specimen Description BLOOD RIGHT ANTECUBITAL  Final   Special Requests BOTTLES DRAWN AEROBIC AND ANAEROBIC  5CC  Final   Culture NO GROWTH 1 DAY  Final   Report Status PENDING  Incomplete  Blood culture (routine x 2)     Status: None (Preliminary result)   Collection Time: 01/11/16 10:34 AM  Result Value  Ref Range Status   Specimen Description BLOOD RIGHT HAND  Final   Special Requests BOTTLES DRAWN AEROBIC AND ANAEROBIC  5CC  Final   Culture NO GROWTH 1 DAY  Final   Report Status PENDING  Incomplete     Labs: BNP (last 3 results) No results for input(s): BNP in the last 8760 hours. Basic Metabolic Panel:  Recent Labs Lab 01/11/16 0625 01/12/16 0440 01/13/16 0727  NA 137 140 142  K 4.1 5.0 4.1  CL 103 109 108  CO2 25 26 28   GLUCOSE 125* 136* 114*  BUN 13 7 5*  CREATININE 1.19* 0.88 0.77  CALCIUM 9.1 8.5* 8.8*   Liver Function Tests:  Recent Labs Lab 01/11/16 0625  AST 22  ALT 14  ALKPHOS 48  BILITOT 0.8  PROT 6.4*  ALBUMIN 3.6   No results for input(s): LIPASE, AMYLASE in the last 168 hours. No results for input(s): AMMONIA in the last 168 hours. CBC:  Recent Labs Lab 01/11/16 0625 01/12/16 0440 01/13/16 0727  WBC 9.5 7.0 4.0  NEUTROABS 7.2  --   --   HGB 14.6 12.3 12.7  HCT 44.2 38.9 39.8  MCV 95.9 98.2 97.3  PLT 161 134* 142*   Cardiac Enzymes: No results for input(s): CKTOTAL, CKMB, CKMBINDEX, TROPONINI in the last 168 hours. BNP: Invalid input(s): POCBNP CBG: No results for input(s): GLUCAP in the last 168 hours. D-Dimer No results for input(s): DDIMER in the last 72 hours. Hgb A1c No results for input(s): HGBA1C in the last 72 hours. Lipid Profile No results for input(s): CHOL, HDL, LDLCALC, TRIG, CHOLHDL, LDLDIRECT in the last 72 hours. Thyroid function studies No results for input(s): TSH, T4TOTAL, T3FREE, THYROIDAB in the last 72 hours.  Invalid input(s): FREET3 Anemia work up No results for input(s): VITAMINB12, FOLATE, FERRITIN, TIBC, IRON, RETICCTPCT in the last 72 hours. Urinalysis    Component Value Date/Time   COLORURINE AMBER (A) 01/11/2016 0837   APPEARANCEUR CLOUDY (A) 01/11/2016 0837   LABSPEC 1.018 01/11/2016 0837   PHURINE 6.5 01/11/2016 0837   GLUCOSEU NEGATIVE 01/11/2016 0837   HGBUR NEGATIVE 01/11/2016 0837    BILIRUBINUR NEGATIVE 01/11/2016 0837   BILIRUBINUR negative 04/01/2015 1146   KETONESUR 15 (A) 01/11/2016 0837   PROTEINUR NEGATIVE 01/11/2016 0837   UROBILINOGEN 0.2 04/01/2015 1146   UROBILINOGEN 0.2 09/09/2011 0558   NITRITE NEGATIVE 01/11/2016 0837   LEUKOCYTESUR LARGE (A) 01/11/2016 0837   Sepsis Labs Invalid input(s): PROCALCITONIN,  WBC,  LACTICIDVEN Microbiology Recent Results (from the past 240 hour(s))  Urine culture     Status: Abnormal   Collection Time: 01/11/16  8:37 AM  Result Value Ref Range Status   Specimen Description URINE, CLEAN CATCH  Final   Special Requests NONE  Final   Culture <10,000 COLONIES/mL INSIGNIFICANT GROWTH (A)  Final  Report Status 01/12/2016 FINAL  Final  Blood culture (routine x 2)     Status: None (Preliminary result)   Collection Time: 01/11/16 10:20 AM  Result Value Ref Range Status   Specimen Description BLOOD RIGHT ANTECUBITAL  Final   Special Requests BOTTLES DRAWN AEROBIC AND ANAEROBIC  5CC  Final   Culture NO GROWTH 1 DAY  Final   Report Status PENDING  Incomplete  Blood culture (routine x 2)     Status: None (Preliminary result)   Collection Time: 01/11/16 10:34 AM  Result Value Ref Range Status   Specimen Description BLOOD RIGHT HAND  Final   Special Requests BOTTLES DRAWN AEROBIC AND ANAEROBIC  5CC  Final   Culture NO GROWTH 1 DAY  Final   Report Status PENDING  Incomplete     Time coordinating discharge: Over 30 minutes  SIGNED:   Elmarie Shiley, MD  [facility] 01/13/2016, 10:07 AM Pager [phone]  If 7PM-7AM, please contact night-coverage [email or web address] Password [email or web address]

## 2016-01-14 DIAGNOSIS — Z7901 Long term (current) use of anticoagulants: Secondary | ICD-10-CM | POA: Diagnosis not present

## 2016-01-14 DIAGNOSIS — I4891 Unspecified atrial fibrillation: Secondary | ICD-10-CM | POA: Diagnosis not present

## 2016-01-16 DIAGNOSIS — K5792 Diverticulitis of intestine, part unspecified, without perforation or abscess without bleeding: Secondary | ICD-10-CM | POA: Diagnosis not present

## 2016-01-16 DIAGNOSIS — Z09 Encounter for follow-up examination after completed treatment for conditions other than malignant neoplasm: Secondary | ICD-10-CM | POA: Diagnosis not present

## 2016-01-16 DIAGNOSIS — I10 Essential (primary) hypertension: Secondary | ICD-10-CM | POA: Diagnosis not present

## 2016-01-16 LAB — CULTURE, BLOOD (ROUTINE X 2)
Culture: NO GROWTH
Culture: NO GROWTH

## 2016-01-21 DIAGNOSIS — I482 Chronic atrial fibrillation: Secondary | ICD-10-CM | POA: Diagnosis not present

## 2016-01-21 DIAGNOSIS — Z7901 Long term (current) use of anticoagulants: Secondary | ICD-10-CM | POA: Diagnosis not present

## 2016-01-23 DIAGNOSIS — R8290 Unspecified abnormal findings in urine: Secondary | ICD-10-CM | POA: Diagnosis not present

## 2016-01-28 DIAGNOSIS — I4891 Unspecified atrial fibrillation: Secondary | ICD-10-CM | POA: Diagnosis not present

## 2016-01-28 DIAGNOSIS — Z7901 Long term (current) use of anticoagulants: Secondary | ICD-10-CM | POA: Diagnosis not present

## 2016-02-05 DIAGNOSIS — N3000 Acute cystitis without hematuria: Secondary | ICD-10-CM | POA: Diagnosis not present

## 2016-02-05 DIAGNOSIS — Z7901 Long term (current) use of anticoagulants: Secondary | ICD-10-CM | POA: Diagnosis not present

## 2016-02-05 DIAGNOSIS — M545 Low back pain: Secondary | ICD-10-CM | POA: Diagnosis not present

## 2016-02-12 DIAGNOSIS — Z7901 Long term (current) use of anticoagulants: Secondary | ICD-10-CM | POA: Diagnosis not present

## 2016-02-12 DIAGNOSIS — I4891 Unspecified atrial fibrillation: Secondary | ICD-10-CM | POA: Diagnosis not present

## 2016-02-14 ENCOUNTER — Other Ambulatory Visit: Payer: Self-pay

## 2016-02-18 DIAGNOSIS — H6092 Unspecified otitis externa, left ear: Secondary | ICD-10-CM | POA: Diagnosis not present

## 2016-02-18 DIAGNOSIS — H7292 Unspecified perforation of tympanic membrane, left ear: Secondary | ICD-10-CM | POA: Diagnosis not present

## 2016-02-20 DIAGNOSIS — M5417 Radiculopathy, lumbosacral region: Secondary | ICD-10-CM | POA: Diagnosis not present

## 2016-02-20 DIAGNOSIS — G5603 Carpal tunnel syndrome, bilateral upper limbs: Secondary | ICD-10-CM | POA: Diagnosis not present

## 2016-02-20 DIAGNOSIS — M797 Fibromyalgia: Secondary | ICD-10-CM | POA: Diagnosis not present

## 2016-02-20 DIAGNOSIS — M542 Cervicalgia: Secondary | ICD-10-CM | POA: Diagnosis not present

## 2016-02-20 DIAGNOSIS — G603 Idiopathic progressive neuropathy: Secondary | ICD-10-CM | POA: Diagnosis not present

## 2016-02-26 DIAGNOSIS — Z7901 Long term (current) use of anticoagulants: Secondary | ICD-10-CM | POA: Diagnosis not present

## 2016-03-11 DIAGNOSIS — Z23 Encounter for immunization: Secondary | ICD-10-CM | POA: Diagnosis not present

## 2016-03-11 DIAGNOSIS — Z7901 Long term (current) use of anticoagulants: Secondary | ICD-10-CM | POA: Diagnosis not present

## 2016-03-11 DIAGNOSIS — I4891 Unspecified atrial fibrillation: Secondary | ICD-10-CM | POA: Diagnosis not present

## 2016-03-25 DIAGNOSIS — I482 Chronic atrial fibrillation: Secondary | ICD-10-CM | POA: Diagnosis not present

## 2016-03-25 DIAGNOSIS — Z7901 Long term (current) use of anticoagulants: Secondary | ICD-10-CM | POA: Diagnosis not present

## 2016-04-01 DIAGNOSIS — Z7901 Long term (current) use of anticoagulants: Secondary | ICD-10-CM | POA: Diagnosis not present

## 2016-04-01 DIAGNOSIS — I482 Chronic atrial fibrillation: Secondary | ICD-10-CM | POA: Diagnosis not present

## 2016-04-02 DIAGNOSIS — Z08 Encounter for follow-up examination after completed treatment for malignant neoplasm: Secondary | ICD-10-CM | POA: Diagnosis not present

## 2016-04-02 DIAGNOSIS — Z85828 Personal history of other malignant neoplasm of skin: Secondary | ICD-10-CM | POA: Diagnosis not present

## 2016-04-02 DIAGNOSIS — L820 Inflamed seborrheic keratosis: Secondary | ICD-10-CM | POA: Diagnosis not present

## 2016-04-15 DIAGNOSIS — Z7901 Long term (current) use of anticoagulants: Secondary | ICD-10-CM | POA: Diagnosis not present

## 2016-04-15 DIAGNOSIS — I4891 Unspecified atrial fibrillation: Secondary | ICD-10-CM | POA: Diagnosis not present

## 2016-04-29 DIAGNOSIS — E850 Non-neuropathic heredofamilial amyloidosis: Secondary | ICD-10-CM | POA: Diagnosis not present

## 2016-04-29 DIAGNOSIS — Z0000 Encounter for general adult medical examination without abnormal findings: Secondary | ICD-10-CM | POA: Diagnosis not present

## 2016-04-29 DIAGNOSIS — G629 Polyneuropathy, unspecified: Secondary | ICD-10-CM | POA: Diagnosis not present

## 2016-04-29 DIAGNOSIS — I10 Essential (primary) hypertension: Secondary | ICD-10-CM | POA: Diagnosis not present

## 2016-04-29 DIAGNOSIS — Z7901 Long term (current) use of anticoagulants: Secondary | ICD-10-CM | POA: Diagnosis not present

## 2016-04-29 DIAGNOSIS — Z23 Encounter for immunization: Secondary | ICD-10-CM | POA: Diagnosis not present

## 2016-04-29 DIAGNOSIS — R7303 Prediabetes: Secondary | ICD-10-CM | POA: Diagnosis not present

## 2016-04-29 DIAGNOSIS — I482 Chronic atrial fibrillation: Secondary | ICD-10-CM | POA: Diagnosis not present

## 2016-04-29 DIAGNOSIS — K219 Gastro-esophageal reflux disease without esophagitis: Secondary | ICD-10-CM | POA: Diagnosis not present

## 2016-04-29 DIAGNOSIS — E7800 Pure hypercholesterolemia, unspecified: Secondary | ICD-10-CM | POA: Diagnosis not present

## 2016-04-29 DIAGNOSIS — E042 Nontoxic multinodular goiter: Secondary | ICD-10-CM | POA: Diagnosis not present

## 2016-04-29 DIAGNOSIS — Z86718 Personal history of other venous thrombosis and embolism: Secondary | ICD-10-CM | POA: Diagnosis not present

## 2016-05-13 DIAGNOSIS — M25561 Pain in right knee: Secondary | ICD-10-CM | POA: Diagnosis not present

## 2016-05-13 DIAGNOSIS — Z7901 Long term (current) use of anticoagulants: Secondary | ICD-10-CM | POA: Diagnosis not present

## 2016-05-20 DIAGNOSIS — E042 Nontoxic multinodular goiter: Secondary | ICD-10-CM | POA: Diagnosis not present

## 2016-05-20 DIAGNOSIS — E7800 Pure hypercholesterolemia, unspecified: Secondary | ICD-10-CM | POA: Diagnosis not present

## 2016-05-20 DIAGNOSIS — I10 Essential (primary) hypertension: Secondary | ICD-10-CM | POA: Diagnosis not present

## 2016-06-17 DIAGNOSIS — Z7901 Long term (current) use of anticoagulants: Secondary | ICD-10-CM | POA: Diagnosis not present

## 2016-06-17 DIAGNOSIS — I482 Chronic atrial fibrillation: Secondary | ICD-10-CM | POA: Diagnosis not present

## 2016-06-23 DIAGNOSIS — M1711 Unilateral primary osteoarthritis, right knee: Secondary | ICD-10-CM | POA: Diagnosis not present

## 2016-06-23 DIAGNOSIS — M25561 Pain in right knee: Secondary | ICD-10-CM | POA: Diagnosis not present

## 2016-07-01 DIAGNOSIS — M1711 Unilateral primary osteoarthritis, right knee: Secondary | ICD-10-CM | POA: Diagnosis not present

## 2016-07-08 DIAGNOSIS — M1711 Unilateral primary osteoarthritis, right knee: Secondary | ICD-10-CM | POA: Diagnosis not present

## 2016-07-15 DIAGNOSIS — M1711 Unilateral primary osteoarthritis, right knee: Secondary | ICD-10-CM | POA: Diagnosis not present

## 2016-07-16 DIAGNOSIS — I482 Chronic atrial fibrillation: Secondary | ICD-10-CM | POA: Diagnosis not present

## 2016-07-16 DIAGNOSIS — Z7901 Long term (current) use of anticoagulants: Secondary | ICD-10-CM | POA: Diagnosis not present

## 2016-07-22 DIAGNOSIS — M1711 Unilateral primary osteoarthritis, right knee: Secondary | ICD-10-CM | POA: Diagnosis not present

## 2016-08-13 DIAGNOSIS — Z7901 Long term (current) use of anticoagulants: Secondary | ICD-10-CM | POA: Diagnosis not present

## 2016-08-13 DIAGNOSIS — I482 Chronic atrial fibrillation: Secondary | ICD-10-CM | POA: Diagnosis not present

## 2016-08-20 DIAGNOSIS — Z08 Encounter for follow-up examination after completed treatment for malignant neoplasm: Secondary | ICD-10-CM | POA: Diagnosis not present

## 2016-08-20 DIAGNOSIS — Z85828 Personal history of other malignant neoplasm of skin: Secondary | ICD-10-CM | POA: Diagnosis not present

## 2016-08-22 DIAGNOSIS — M1711 Unilateral primary osteoarthritis, right knee: Secondary | ICD-10-CM | POA: Diagnosis not present

## 2016-08-25 DIAGNOSIS — H7292 Unspecified perforation of tympanic membrane, left ear: Secondary | ICD-10-CM | POA: Diagnosis not present

## 2016-08-27 DIAGNOSIS — Z7901 Long term (current) use of anticoagulants: Secondary | ICD-10-CM | POA: Diagnosis not present

## 2016-08-27 DIAGNOSIS — I482 Chronic atrial fibrillation: Secondary | ICD-10-CM | POA: Diagnosis not present

## 2016-09-03 DIAGNOSIS — I4891 Unspecified atrial fibrillation: Secondary | ICD-10-CM | POA: Diagnosis not present

## 2016-09-03 DIAGNOSIS — Z7901 Long term (current) use of anticoagulants: Secondary | ICD-10-CM | POA: Diagnosis not present

## 2016-10-02 DIAGNOSIS — M1711 Unilateral primary osteoarthritis, right knee: Secondary | ICD-10-CM | POA: Diagnosis not present

## 2016-10-02 DIAGNOSIS — M25561 Pain in right knee: Secondary | ICD-10-CM | POA: Diagnosis not present

## 2016-10-03 DIAGNOSIS — Z7901 Long term (current) use of anticoagulants: Secondary | ICD-10-CM | POA: Diagnosis not present

## 2016-10-03 DIAGNOSIS — I4891 Unspecified atrial fibrillation: Secondary | ICD-10-CM | POA: Diagnosis not present

## 2016-10-31 DIAGNOSIS — I482 Chronic atrial fibrillation: Secondary | ICD-10-CM | POA: Diagnosis not present

## 2016-10-31 DIAGNOSIS — Z7901 Long term (current) use of anticoagulants: Secondary | ICD-10-CM | POA: Diagnosis not present

## 2016-11-13 DIAGNOSIS — M25561 Pain in right knee: Secondary | ICD-10-CM | POA: Diagnosis not present

## 2016-11-20 DIAGNOSIS — H7292 Unspecified perforation of tympanic membrane, left ear: Secondary | ICD-10-CM | POA: Diagnosis not present

## 2016-11-20 DIAGNOSIS — H60332 Swimmer's ear, left ear: Secondary | ICD-10-CM | POA: Diagnosis not present

## 2016-11-28 DIAGNOSIS — I482 Chronic atrial fibrillation: Secondary | ICD-10-CM | POA: Diagnosis not present

## 2016-11-28 DIAGNOSIS — Z7901 Long term (current) use of anticoagulants: Secondary | ICD-10-CM | POA: Diagnosis not present

## 2016-12-26 DIAGNOSIS — I482 Chronic atrial fibrillation: Secondary | ICD-10-CM | POA: Diagnosis not present

## 2016-12-26 DIAGNOSIS — Z7901 Long term (current) use of anticoagulants: Secondary | ICD-10-CM | POA: Diagnosis not present

## 2017-01-12 DIAGNOSIS — Z01818 Encounter for other preprocedural examination: Secondary | ICD-10-CM | POA: Diagnosis not present

## 2017-01-12 DIAGNOSIS — G629 Polyneuropathy, unspecified: Secondary | ICD-10-CM | POA: Diagnosis not present

## 2017-01-12 DIAGNOSIS — I482 Chronic atrial fibrillation: Secondary | ICD-10-CM | POA: Diagnosis not present

## 2017-01-12 DIAGNOSIS — Z7901 Long term (current) use of anticoagulants: Secondary | ICD-10-CM | POA: Diagnosis not present

## 2017-01-12 DIAGNOSIS — E7800 Pure hypercholesterolemia, unspecified: Secondary | ICD-10-CM | POA: Diagnosis not present

## 2017-01-12 DIAGNOSIS — E042 Nontoxic multinodular goiter: Secondary | ICD-10-CM | POA: Diagnosis not present

## 2017-01-12 DIAGNOSIS — K219 Gastro-esophageal reflux disease without esophagitis: Secondary | ICD-10-CM | POA: Diagnosis not present

## 2017-01-12 DIAGNOSIS — I10 Essential (primary) hypertension: Secondary | ICD-10-CM | POA: Diagnosis not present

## 2017-01-12 DIAGNOSIS — Z86718 Personal history of other venous thrombosis and embolism: Secondary | ICD-10-CM | POA: Diagnosis not present

## 2017-01-14 ENCOUNTER — Other Ambulatory Visit: Payer: Self-pay | Admitting: Orthopedic Surgery

## 2017-01-22 ENCOUNTER — Other Ambulatory Visit (HOSPITAL_COMMUNITY): Payer: Self-pay | Admitting: Emergency Medicine

## 2017-01-22 NOTE — Patient Instructions (Signed)
Debbie Richards  01/22/2017   Your procedure is scheduled on: 01-30-17  Report to [facility] Main  Entrance Take [place]  elevators to 3rd floor to  [place] at 530AM.    Call this number if you have problems the morning of surgery [phone]    Remember: ONLY 1 PERSON MAY GO WITH YOU TO SHORT STAY TO GET  READY MORNING OF YOUR SURGERY.  Do not eat food or drink liquids :After Midnight.     Take these medicines the morning of surgery with A SIP OF WATER: atenolol, levothyroxine, tylenol as needed, omeprazole(prilosec) as needed                                You may not have any metal on your body including hair pins and              piercings  Do not wear jewelry, make-up, lotions, powders or perfumes, deodorant             Do not wear nail polish.  Do not shave  48 hours prior to surgery.         Do not bring valuables to the hospital. [place].  Contacts, dentures or bridgework may not be worn into surgery.  Leave suitcase in the car. After surgery it may be brought to your room.                Please read over the following fact sheets you were given: _____________________________________________________________________            [facility] - Preparing for Surgery Before surgery, you can play an important role.  Because skin is not sterile, your skin needs to be as free of germs as possible.  You can reduce the number of germs on your skin by washing with CHG (chlorahexidine gluconate) soap before surgery.  CHG is an antiseptic cleaner which kills germs and bonds with the skin to continue killing germs even after washing. Please DO NOT use if you have an allergy to CHG or antibacterial soaps.  If your skin becomes reddened/irritated stop using the CHG and inform your nurse when you arrive at Short Stay. Do not shave (including legs and underarms) for at least 48 hours prior to the first CHG  shower.  You may shave your face/neck. Please follow these instructions carefully:  1.  Shower with CHG Soap the night before surgery and the  morning of Surgery.  2.  If you choose to wash your hair, wash your hair first as usual with your  normal  shampoo.  3.  After you shampoo, rinse your hair and body thoroughly to remove the  shampoo.                           4.  Use CHG as you would any other liquid soap.  You can apply chg directly  to the skin and wash                       Gently with a scrungie or clean washcloth.  5.  Apply the CHG Soap to your body ONLY FROM THE NECK  DOWN.   Do not use on face/ open                           Wound or open sores. Avoid contact with eyes, ears mouth and genitals (private parts).                       Wash face,  Genitals (private parts) with your normal soap.             6.  Wash thoroughly, paying special attention to the area where your surgery  will be performed.  7.  Thoroughly rinse your body with warm water from the neck down.  8.  DO NOT shower/wash with your normal soap after using and rinsing off  the CHG Soap.                9.  Pat yourself dry with a clean towel.            10.  Wear clean pajamas.            11.  Place clean sheets on your bed the night of your first shower and do not  sleep with pets. Day of Surgery : Do not apply any lotions/deodorants the morning of surgery.  Please wear clean clothes to the hospital/surgery center.  FAILURE TO FOLLOW THESE INSTRUCTIONS MAY RESULT IN THE CANCELLATION OF YOUR SURGERY PATIENT SIGNATURE_________________________________  NURSE SIGNATURE__________________________________  ________________________________________________________________________   Adam Phenix  An incentive spirometer is a tool that can help keep your lungs clear and active. This tool measures how well you are filling your lungs with each breath. Taking long deep breaths may help reverse or decrease the chance  of developing breathing (pulmonary) problems (especially infection) following:  A long period of time when you are unable to move or be active. BEFORE THE PROCEDURE   If the spirometer includes an indicator to show your best effort, your nurse or respiratory therapist will set it to a desired goal.  If possible, sit up straight or lean slightly forward. Try not to slouch.  Hold the incentive spirometer in an upright position. INSTRUCTIONS FOR USE  1. Sit on the edge of your bed if possible, or sit up as far as you can in bed or on a chair. 2. Hold the incentive spirometer in an upright position. 3. Breathe out normally. 4. Place the mouthpiece in your mouth and seal your lips tightly around it. 5. Breathe in slowly and as deeply as possible, raising the piston or the ball toward the top of the column. 6. Hold your breath for 3-5 seconds or for as long as possible. Allow the piston or ball to fall to the bottom of the column. 7. Remove the mouthpiece from your mouth and breathe out normally. 8. Rest for a few seconds and repeat Steps 1 through 7 at least 10 times every 1-2 hours when you are awake. Take your time and take a few normal breaths between deep breaths. 9. The spirometer may include an indicator to show your best effort. Use the indicator as a goal to work toward during each repetition. 10. After each set of 10 deep breaths, practice coughing to be sure your lungs are clear. If you have an incision (the cut made at the time of surgery), support your incision when coughing by placing a pillow or rolled up towels firmly against it. Once you are able to  get out of bed, walk around indoors and cough well. You may stop using the incentive spirometer when instructed by your caregiver.  RISKS AND COMPLICATIONS  Take your time so you do not get dizzy or light-headed.  If you are in pain, you may need to take or ask for pain medication before doing incentive spirometry. It is harder to  take a deep breath if you are having pain. AFTER USE  Rest and breathe slowly and easily.  It can be helpful to keep track of a log of your progress. Your caregiver can provide you with a simple table to help with this. If you are using the spirometer at home, follow these instructions: [place] IF:   You are having difficultly using the spirometer.  You have trouble using the spirometer as often as instructed.  Your pain medication is not giving enough relief while using the spirometer.  You develop fever of 100.5 F (38.1 C) or higher. SEEK IMMEDIATE MEDICAL CARE IF:   You cough up bloody sputum that had not been present before.  You develop fever of 102 F (38.9 C) or greater.  You develop worsening pain at or near the incision site. MAKE SURE YOU:   Understand these instructions.  Will watch your condition.  Will get help right away if you are not doing well or get worse. Document Released: 10/06/2006 Document Revised: 08/18/2011 Document Reviewed: 12/07/2006 [facility] Patient Information 2014 [place], [place].   ________________________________________________________________________

## 2017-01-22 NOTE — Progress Notes (Signed)
STRESS/ECHO 09-10-11 epic  "Aortic valve:  Trileaflet; mildly calcified leaflets.  Mobility was not restricted. Sclerosis without  stenosis. Doppler: Transvalvular velocity was  within the normal range. There was no  stenosis. No regurgitation."  "IMPRESSION:No evidence of ischemia or  infarction. Ejection fraction 76%."  US doppler venous leg right  02-08-15 epic (care everywhere)   "No evidence of deep venous thrombosis in the  right lower extremity."

## 2017-01-23 ENCOUNTER — Encounter (HOSPITAL_COMMUNITY)
Admission: RE | Admit: 2017-01-23 | Discharge: 2017-01-23 | Disposition: A | Discharge status: Home / Self Care | Payer: Medicare Other | Source: Ambulatory Visit | Attending: Orthopedic Surgery | Admitting: Orthopedic Surgery

## 2017-01-23 ENCOUNTER — Encounter (HOSPITAL_COMMUNITY): Payer: Self-pay

## 2017-01-23 ENCOUNTER — Ambulatory Visit (HOSPITAL_COMMUNITY)
Admission: RE | Admit: 2017-01-23 | Discharge: 2017-01-23 | Disposition: A | Discharge status: Home / Self Care | Payer: Medicare Other | Source: Ambulatory Visit | Attending: Orthopedic Surgery | Admitting: Orthopedic Surgery

## 2017-01-23 DIAGNOSIS — I482 Chronic atrial fibrillation: Secondary | ICD-10-CM | POA: Diagnosis not present

## 2017-01-23 DIAGNOSIS — I10 Essential (primary) hypertension: Secondary | ICD-10-CM | POA: Diagnosis not present

## 2017-01-23 DIAGNOSIS — Z01818 Encounter for other preprocedural examination: Secondary | ICD-10-CM | POA: Insufficient documentation

## 2017-01-23 DIAGNOSIS — E119 Type 2 diabetes mellitus without complications: Secondary | ICD-10-CM | POA: Diagnosis not present

## 2017-01-23 DIAGNOSIS — Z7901 Long term (current) use of anticoagulants: Secondary | ICD-10-CM | POA: Diagnosis not present

## 2017-01-23 DIAGNOSIS — M1711 Unilateral primary osteoarthritis, right knee: Secondary | ICD-10-CM | POA: Diagnosis not present

## 2017-01-23 DIAGNOSIS — I700 Atherosclerosis of aorta: Secondary | ICD-10-CM | POA: Diagnosis not present

## 2017-01-23 LAB — COMPREHENSIVE METABOLIC PANEL
ALT: 12 U/L — ABNORMAL LOW (ref 14–54)
AST: 25 U/L (ref 15–41)
Albumin: 4.6 g/dL (ref 3.5–5.0)
Alkaline Phosphatase: 55 U/L (ref 38–126)
Anion gap: 9 (ref 5–15)
BUN: 27 mg/dL — ABNORMAL HIGH (ref 6–20)
CO2: 26 mmol/L (ref 22–32)
Calcium: 9.8 mg/dL (ref 8.9–10.3)
Chloride: 110 mmol/L (ref 101–111)
Creatinine, Ser: 1.31 mg/dL — ABNORMAL HIGH (ref 0.44–1.00)
GFR calc Af Amer: 42 mL/min — ABNORMAL LOW (ref >60)
GFR calc non Af Amer: 36 mL/min — ABNORMAL LOW (ref >60)
Glucose, Bld: 121 mg/dL — ABNORMAL HIGH (ref 65–99)
Potassium: 5.5 mmol/L — ABNORMAL HIGH (ref 3.5–5.1)
Sodium: 145 mmol/L (ref 135–145)
Total Bilirubin: 0.9 mg/dL (ref 0.3–1.2)
Total Protein: 7.4 g/dL (ref 6.5–8.1)

## 2017-01-23 LAB — CBC WITH DIFFERENTIAL/PLATELET
Basophils Absolute: 0 10*3/uL (ref 0.0–0.1)
Basophils Relative: 0 %
Eosinophils Absolute: 0.2 10*3/uL (ref 0.0–0.7)
Eosinophils Relative: 2 %
HCT: 43.7 % (ref 36.0–46.0)
Hemoglobin: 14.5 g/dL (ref 12.0–15.0)
Lymphocytes Relative: 25 %
Lymphs Abs: 2 10*3/uL (ref 0.7–4.0)
MCH: 31.3 pg (ref 26.0–34.0)
MCHC: 33.2 g/dL (ref 30.0–36.0)
MCV: 94.4 fL (ref 78.0–100.0)
Monocytes Absolute: 0.5 10*3/uL (ref 0.1–1.0)
Monocytes Relative: 7 %
Neutro Abs: 5.2 10*3/uL (ref 1.7–7.7)
Neutrophils Relative %: 66 %
Platelets: 145 10*3/uL — ABNORMAL LOW (ref 150–400)
RBC: 4.63 MIL/uL (ref 3.87–5.11)
RDW: 12.9 % (ref 11.5–15.5)
WBC: 7.8 10*3/uL (ref 4.0–10.5)

## 2017-01-23 LAB — URINALYSIS, ROUTINE W REFLEX MICROSCOPIC
Bilirubin Urine: NEGATIVE
Glucose, UA: NEGATIVE mg/dL
Hgb urine dipstick: NEGATIVE
Ketones, ur: NEGATIVE mg/dL
Nitrite: NEGATIVE
Protein, ur: 30 mg/dL — AB
Specific Gravity, Urine: 1.026 (ref 1.005–1.030)
pH: 5 (ref 5.0–8.0)

## 2017-01-23 LAB — PROTIME-INR
INR: 2.06
Prothrombin Time: 23.6 seconds — ABNORMAL HIGH (ref 11.4–15.2)

## 2017-01-23 LAB — ABO/RH: ABO/RH(D): A POS

## 2017-01-23 LAB — APTT: aPTT: 40 seconds — ABNORMAL HIGH (ref 24–36)

## 2017-01-23 LAB — SURGICAL PCR SCREEN
MRSA, PCR: NEGATIVE
Staphylococcus aureus: NEGATIVE

## 2017-01-23 LAB — HEMOGLOBIN A1C
Hgb A1c MFr Bld: 5.8 % — ABNORMAL HIGH (ref 4.8–5.6)
Mean Plasma Glucose: 119.76 mg/dL

## 2017-01-23 NOTE — Progress Notes (Signed)
CBCdiff, CMP, Pt, PTT, and UA route via epic to Dr Berenice Primas

## 2017-01-26 ENCOUNTER — Other Ambulatory Visit: Payer: Self-pay | Admitting: Orthopedic Surgery

## 2017-01-29 ENCOUNTER — Other Ambulatory Visit: Payer: Self-pay | Admitting: Orthopedic Surgery

## 2017-01-29 ENCOUNTER — Encounter (HOSPITAL_COMMUNITY): Payer: Self-pay | Admitting: Anesthesiology

## 2017-01-29 DIAGNOSIS — M1711 Unilateral primary osteoarthritis, right knee: Secondary | ICD-10-CM | POA: Diagnosis not present

## 2017-01-29 NOTE — Anesthesia Preprocedure Evaluation (Addendum)
Anesthesia Evaluation  Patient identified by MRN, date of birth, ID band Patient awake    Reviewed: Allergy & Precautions, NPO status , Patient's Chart, lab work & pertinent test results  Airway Mallampati: II       Dental no notable dental hx. (+) Teeth Intact   Pulmonary    Pulmonary exam normal        Cardiovascular hypertension, Pt. on home beta blockers Normal cardiovascular exam Rhythm:Regular Rate:Normal     Neuro/Psych    GI/Hepatic GERD  Medicated,  Endo/Other  Hypothyroidism   Renal/GU      Musculoskeletal   Abdominal (+) + obese,   Peds  Hematology   Anesthesia Other Findings   Reproductive/Obstetrics                            Anesthesia Physical Anesthesia Plan  ASA: II  Anesthesia Plan: General   Post-op Pain Management:  Regional for Post-op pain   Induction: Intravenous  PONV Risk Score and Plan: 3 and Ondansetron, Dexamethasone, Midazolam, Propofol infusion and Treatment may vary due to age or medical condition  Airway Management Planned: Oral ETT  Additional Equipment:   Intra-op Plan:   Post-operative Plan: Extubation in OR  Informed Consent: I have reviewed the patients History and Physical, chart, labs and discussed the procedure including the risks, benefits and alternatives for the proposed anesthesia with the patient or authorized representative who has indicated his/her understanding and acceptance.   Dental advisory given  Plan Discussed with: CRNA and Surgeon  Anesthesia Plan Comments:        Anesthesia Quick Evaluation

## 2017-01-30 ENCOUNTER — Inpatient Hospital Stay (HOSPITAL_COMMUNITY): Payer: Medicare Other | Admitting: Anesthesiology

## 2017-01-30 ENCOUNTER — Encounter (HOSPITAL_COMMUNITY)
Admission: RE | Disposition: A | Discharge status: Home / Self Care | Payer: Self-pay | Source: Ambulatory Visit | Attending: Orthopedic Surgery

## 2017-01-30 ENCOUNTER — Inpatient Hospital Stay (HOSPITAL_COMMUNITY)
Admission: RE | Admit: 2017-01-30 | Discharge: 2017-02-02 | DRG: 470 | Disposition: A | Discharge status: Home / Self Care | Payer: Medicare Other | Source: Ambulatory Visit | Attending: Orthopedic Surgery | Admitting: Orthopedic Surgery

## 2017-01-30 ENCOUNTER — Encounter (HOSPITAL_COMMUNITY): Payer: Self-pay | Admitting: *Deleted

## 2017-01-30 DIAGNOSIS — E049 Nontoxic goiter, unspecified: Secondary | ICD-10-CM | POA: Diagnosis not present

## 2017-01-30 DIAGNOSIS — Z8672 Personal history of thrombophlebitis: Secondary | ICD-10-CM

## 2017-01-30 DIAGNOSIS — Z86718 Personal history of other venous thrombosis and embolism: Secondary | ICD-10-CM

## 2017-01-30 DIAGNOSIS — Z471 Aftercare following joint replacement surgery: Secondary | ICD-10-CM | POA: Diagnosis not present

## 2017-01-30 DIAGNOSIS — Z8249 Family history of ischemic heart disease and other diseases of the circulatory system: Secondary | ICD-10-CM | POA: Diagnosis not present

## 2017-01-30 DIAGNOSIS — G8918 Other acute postprocedural pain: Secondary | ICD-10-CM | POA: Diagnosis not present

## 2017-01-30 DIAGNOSIS — Z79899 Other long term (current) drug therapy: Secondary | ICD-10-CM

## 2017-01-30 DIAGNOSIS — M1990 Unspecified osteoarthritis, unspecified site: Secondary | ICD-10-CM | POA: Diagnosis not present

## 2017-01-30 DIAGNOSIS — S8002XA Contusion of left knee, initial encounter: Secondary | ICD-10-CM | POA: Diagnosis not present

## 2017-01-30 DIAGNOSIS — Z86711 Personal history of pulmonary embolism: Secondary | ICD-10-CM

## 2017-01-30 DIAGNOSIS — E785 Hyperlipidemia, unspecified: Secondary | ICD-10-CM | POA: Diagnosis not present

## 2017-01-30 DIAGNOSIS — Z90710 Acquired absence of both cervix and uterus: Secondary | ICD-10-CM

## 2017-01-30 DIAGNOSIS — Z9889 Other specified postprocedural states: Secondary | ICD-10-CM

## 2017-01-30 DIAGNOSIS — I10 Essential (primary) hypertension: Secondary | ICD-10-CM | POA: Diagnosis not present

## 2017-01-30 DIAGNOSIS — Z8711 Personal history of peptic ulcer disease: Secondary | ICD-10-CM | POA: Diagnosis not present

## 2017-01-30 DIAGNOSIS — M797 Fibromyalgia: Secondary | ICD-10-CM | POA: Diagnosis not present

## 2017-01-30 DIAGNOSIS — M1711 Unilateral primary osteoarthritis, right knee: Secondary | ICD-10-CM | POA: Diagnosis not present

## 2017-01-30 DIAGNOSIS — Z823 Family history of stroke: Secondary | ICD-10-CM | POA: Diagnosis not present

## 2017-01-30 DIAGNOSIS — Z9842 Cataract extraction status, left eye: Secondary | ICD-10-CM | POA: Diagnosis not present

## 2017-01-30 DIAGNOSIS — Z9049 Acquired absence of other specified parts of digestive tract: Secondary | ICD-10-CM

## 2017-01-30 DIAGNOSIS — M545 Low back pain: Secondary | ICD-10-CM | POA: Diagnosis not present

## 2017-01-30 DIAGNOSIS — Z9841 Cataract extraction status, right eye: Secondary | ICD-10-CM

## 2017-01-30 DIAGNOSIS — K219 Gastro-esophageal reflux disease without esophagitis: Secondary | ICD-10-CM | POA: Diagnosis not present

## 2017-01-30 DIAGNOSIS — Z7901 Long term (current) use of anticoagulants: Secondary | ICD-10-CM | POA: Diagnosis not present

## 2017-01-30 DIAGNOSIS — G8911 Acute pain due to trauma: Secondary | ICD-10-CM | POA: Diagnosis not present

## 2017-01-30 DIAGNOSIS — G8929 Other chronic pain: Secondary | ICD-10-CM | POA: Diagnosis present

## 2017-01-30 DIAGNOSIS — M170 Bilateral primary osteoarthritis of knee: Secondary | ICD-10-CM | POA: Diagnosis not present

## 2017-01-30 DIAGNOSIS — Z96651 Presence of right artificial knee joint: Secondary | ICD-10-CM | POA: Diagnosis not present

## 2017-01-30 DIAGNOSIS — E039 Hypothyroidism, unspecified: Secondary | ICD-10-CM | POA: Diagnosis present

## 2017-01-30 DIAGNOSIS — K5792 Diverticulitis of intestine, part unspecified, without perforation or abscess without bleeding: Secondary | ICD-10-CM | POA: Diagnosis not present

## 2017-01-30 DIAGNOSIS — M1712 Unilateral primary osteoarthritis, left knee: Secondary | ICD-10-CM | POA: Diagnosis present

## 2017-01-30 DIAGNOSIS — Z87440 Personal history of urinary (tract) infections: Secondary | ICD-10-CM | POA: Diagnosis not present

## 2017-01-30 HISTORY — PX: TOTAL KNEE ARTHROPLASTY: SHX125

## 2017-01-30 LAB — TYPE AND SCREEN
ABO/RH(D): A POS
Antibody Screen: NEGATIVE

## 2017-01-30 SURGERY — ARTHROPLASTY, KNEE, TOTAL
Anesthesia: Spinal | Site: Knee | Laterality: Right

## 2017-01-30 MED ORDER — CHLORHEXIDINE GLUCONATE 4 % EX LIQD: 60.0000 mL | Freq: Once | CUTANEOUS | Status: DC

## 2017-01-30 MED ORDER — TRANEXAMIC ACID 1000 MG/10ML IV SOLN
2000.0000 mg | Freq: Once | INTRAVENOUS | Status: AC
  Administered 2017-01-30: 2000 mg via TOPICAL
  Filled 2017-01-30: qty 20

## 2017-01-30 MED ORDER — ACETAMINOPHEN 325 MG PO TABS
650.0000 mg | ORAL_TABLET | Freq: Four times a day (QID) | ORAL | Status: DC | PRN
  Filled 2017-01-30: qty 2

## 2017-01-30 MED ORDER — ATENOLOL 25 MG PO TABS
25.0000 mg | ORAL_TABLET | Freq: Every day | ORAL | Status: DC
  Administered 2017-01-31 – 2017-02-02 (×3): 25 mg via ORAL
  Filled 2017-01-30 (×3): qty 1

## 2017-01-30 MED ORDER — RIVAROXABAN 20 MG PO TABS
20.0000 mg | ORAL_TABLET | Freq: Every day | ORAL | Status: DC
  Administered 2017-01-30 – 2017-02-01 (×3): 20 mg via ORAL
  Filled 2017-01-30: qty 2
  Filled 2017-01-30: qty 1
  Filled 2017-01-30: qty 2

## 2017-01-30 MED ORDER — DIPHENHYDRAMINE HCL 12.5 MG/5ML PO ELIX: 12.5000 mg | ORAL_SOLUTION | ORAL | Status: DC | PRN

## 2017-01-30 MED ORDER — EPHEDRINE 5 MG/ML INJ
INTRAVENOUS | Status: AC
  Filled 2017-01-30: qty 10

## 2017-01-30 MED ORDER — EPHEDRINE SULFATE 50 MG/ML IJ SOLN
INTRAMUSCULAR | Status: DC | PRN
  Administered 2017-01-30 (×2): 5 mg via INTRAVENOUS

## 2017-01-30 MED ORDER — SUGAMMADEX SODIUM 200 MG/2ML IV SOLN
INTRAVENOUS | Status: DC | PRN
  Administered 2017-01-30: 200 mg via INTRAVENOUS

## 2017-01-30 MED ORDER — DEXAMETHASONE SODIUM PHOSPHATE 10 MG/ML IJ SOLN
INTRAMUSCULAR | Status: AC
  Filled 2017-01-30: qty 1

## 2017-01-30 MED ORDER — PROPOFOL 10 MG/ML IV BOLUS
INTRAVENOUS | Status: DC | PRN
  Administered 2017-01-30: 170 mg via INTRAVENOUS

## 2017-01-30 MED ORDER — CEFAZOLIN SODIUM-DEXTROSE 2-4 GM/100ML-% IV SOLN
2.0000 g | Freq: Four times a day (QID) | INTRAVENOUS | Status: AC
  Administered 2017-01-30 (×2): 2 g via INTRAVENOUS
  Filled 2017-01-30 (×2): qty 100

## 2017-01-30 MED ORDER — 0.9 % SODIUM CHLORIDE (POUR BTL) OPTIME
TOPICAL | Status: DC | PRN
  Administered 2017-01-30: 1000 mL

## 2017-01-30 MED ORDER — TRANEXAMIC ACID 1000 MG/10ML IV SOLN
1000.0000 mg | INTRAVENOUS | Status: DC
  Filled 2017-01-30: qty 10

## 2017-01-30 MED ORDER — CELECOXIB 200 MG PO CAPS
200.0000 mg | ORAL_CAPSULE | Freq: Two times a day (BID) | ORAL | Status: DC
  Administered 2017-01-30 – 2017-02-02 (×6): 200 mg via ORAL
  Filled 2017-01-30 (×7): qty 1

## 2017-01-30 MED ORDER — MIDAZOLAM HCL 5 MG/5ML IJ SOLN
INTRAMUSCULAR | Status: DC | PRN
  Administered 2017-01-30: 2 mg via INTRAVENOUS

## 2017-01-30 MED ORDER — ONDANSETRON HCL 4 MG PO TABS
4.0000 mg | ORAL_TABLET | Freq: Four times a day (QID) | ORAL | Status: DC | PRN
  Administered 2017-01-30: 4 mg via ORAL
  Filled 2017-01-30: qty 1

## 2017-01-30 MED ORDER — PRAVASTATIN SODIUM 20 MG PO TABS
20.0000 mg | ORAL_TABLET | Freq: Every day | ORAL | Status: DC
  Administered 2017-01-30 – 2017-02-01 (×3): 20 mg via ORAL
  Filled 2017-01-30 (×3): qty 1

## 2017-01-30 MED ORDER — BUPIVACAINE-EPINEPHRINE (PF) 0.5% -1:200000 IJ SOLN
INTRAMUSCULAR | Status: AC
  Filled 2017-01-30: qty 30

## 2017-01-30 MED ORDER — SODIUM CHLORIDE 0.9 % IJ SOLN
INTRAMUSCULAR | Status: DC | PRN
  Administered 2017-01-30: 30 mL

## 2017-01-30 MED ORDER — BUPIVACAINE LIPOSOME 1.3 % IJ SUSP
20.0000 mL | Freq: Once | INTRAMUSCULAR | Status: AC
  Administered 2017-01-30: 20 mL
  Filled 2017-01-30: qty 20

## 2017-01-30 MED ORDER — GABAPENTIN 300 MG PO CAPS
600.0000 mg | ORAL_CAPSULE | Freq: Every day | ORAL | Status: DC
  Administered 2017-01-31 – 2017-02-01 (×2): 600 mg via ORAL
  Filled 2017-01-30 (×3): qty 2

## 2017-01-30 MED ORDER — FUROSEMIDE 20 MG PO TABS
20.0000 mg | ORAL_TABLET | Freq: Every day | ORAL | Status: DC
Start: 2017-01-30 — End: 2017-02-02
  Administered 2017-01-30 – 2017-02-02 (×4): 20 mg via ORAL
  Filled 2017-01-30 (×4): qty 1

## 2017-01-30 MED ORDER — ONDANSETRON HCL 4 MG/2ML IJ SOLN: 4.0000 mg | Freq: Four times a day (QID) | INTRAMUSCULAR | Status: DC | PRN

## 2017-01-30 MED ORDER — BISACODYL 5 MG PO TBEC: 5.0000 mg | DELAYED_RELEASE_TABLET | Freq: Every day | ORAL | Status: DC | PRN

## 2017-01-30 MED ORDER — MEPERIDINE HCL 50 MG/ML IJ SOLN: 6.2500 mg | INTRAMUSCULAR | Status: DC | PRN

## 2017-01-30 MED ORDER — FENTANYL CITRATE (PF) 100 MCG/2ML IJ SOLN
INTRAMUSCULAR | Status: DC | PRN
  Administered 2017-01-30 (×4): 50 ug via INTRAVENOUS

## 2017-01-30 MED ORDER — MIDAZOLAM HCL 2 MG/2ML IJ SOLN
INTRAMUSCULAR | Status: AC
  Filled 2017-01-30: qty 2

## 2017-01-30 MED ORDER — PROPOFOL 10 MG/ML IV BOLUS
INTRAVENOUS | Status: AC
  Filled 2017-01-30: qty 60

## 2017-01-30 MED ORDER — PROMETHAZINE HCL 25 MG/ML IJ SOLN: 6.2500 mg | INTRAMUSCULAR | Status: DC | PRN

## 2017-01-30 MED ORDER — FENTANYL CITRATE (PF) 100 MCG/2ML IJ SOLN
INTRAMUSCULAR | Status: AC
  Filled 2017-01-30: qty 2

## 2017-01-30 MED ORDER — DOCUSATE SODIUM 100 MG PO CAPS: 100.0000 mg | ORAL_CAPSULE | Freq: Two times a day (BID) | ORAL | 0 refills | Status: DC

## 2017-01-30 MED ORDER — PANTOPRAZOLE SODIUM 40 MG PO TBEC
40.0000 mg | DELAYED_RELEASE_TABLET | Freq: Every day | ORAL | Status: DC
  Administered 2017-01-30 – 2017-02-02 (×4): 40 mg via ORAL
  Filled 2017-01-30 (×4): qty 1

## 2017-01-30 MED ORDER — CEFAZOLIN SODIUM-DEXTROSE 2-4 GM/100ML-% IV SOLN: 2.0000 g | INTRAVENOUS | Status: DC

## 2017-01-30 MED ORDER — LACTATED RINGERS IV SOLN
INTRAVENOUS | Status: DC | PRN
  Administered 2017-01-30: 06:00:00 via INTRAVENOUS

## 2017-01-30 MED ORDER — METHOCARBAMOL 500 MG PO TABS
500.0000 mg | ORAL_TABLET | Freq: Four times a day (QID) | ORAL | Status: DC | PRN
  Administered 2017-01-30 – 2017-02-02 (×4): 500 mg via ORAL
  Filled 2017-01-30 (×5): qty 1

## 2017-01-30 MED ORDER — POLYETHYLENE GLYCOL 3350 17 G PO PACK: 17.0000 g | PACK | Freq: Every day | ORAL | Status: DC | PRN

## 2017-01-30 MED ORDER — HYDROMORPHONE HCL-NACL 0.5-0.9 MG/ML-% IV SOSY
0.2500 mg | PREFILLED_SYRINGE | INTRAVENOUS | Status: DC | PRN
  Administered 2017-01-30 (×3): 0.5 mg via INTRAVENOUS

## 2017-01-30 MED ORDER — LIDOCAINE HCL (CARDIAC) 20 MG/ML IV SOLN
INTRAVENOUS | Status: DC | PRN
Start: 2017-01-30 — End: 2017-01-30
  Administered 2017-01-30: 50 mg via INTRAVENOUS

## 2017-01-30 MED ORDER — SODIUM CHLORIDE 0.9 % IV SOLN
INTRAVENOUS | Status: DC | PRN
  Administered 2017-01-30: 09:00:00 via INTRAVENOUS

## 2017-01-30 MED ORDER — PROPOFOL 500 MG/50ML IV EMUL
INTRAVENOUS | Status: DC | PRN
  Administered 2017-01-30: 25 ug/kg/min via INTRAVENOUS

## 2017-01-30 MED ORDER — COLCHICINE 0.6 MG PO TABS
0.6000 mg | ORAL_TABLET | Freq: Every day | ORAL | Status: DC
  Administered 2017-01-30 – 2017-02-02 (×4): 0.6 mg via ORAL
  Filled 2017-01-30 (×4): qty 1

## 2017-01-30 MED ORDER — OXYCODONE HCL 5 MG PO TABS
5.0000 mg | ORAL_TABLET | ORAL | Status: DC | PRN
  Administered 2017-01-30 (×2): 5 mg via ORAL
  Administered 2017-01-30 – 2017-02-02 (×11): 10 mg via ORAL
  Filled 2017-01-30 (×3): qty 2
  Filled 2017-01-30 (×2): qty 1
  Filled 2017-01-30 (×9): qty 2

## 2017-01-30 MED ORDER — TIZANIDINE HCL 2 MG PO TABS: 2.0000 mg | ORAL_TABLET | Freq: Three times a day (TID) | ORAL | 0 refills | Status: DC | PRN

## 2017-01-30 MED ORDER — ONDANSETRON HCL 4 MG/2ML IJ SOLN
INTRAMUSCULAR | Status: AC
Start: 2017-01-30 — End: ?
  Filled 2017-01-30: qty 2

## 2017-01-30 MED ORDER — ROCURONIUM BROMIDE 100 MG/10ML IV SOLN
INTRAVENOUS | Status: DC | PRN
  Administered 2017-01-30: 10 mg via INTRAVENOUS
  Administered 2017-01-30: 40 mg via INTRAVENOUS

## 2017-01-30 MED ORDER — ROPIVACAINE HCL 7.5 MG/ML IJ SOLN
INTRAMUSCULAR | Status: DC | PRN
  Administered 2017-01-30 (×2): 5 mL via PERINEURAL
  Administered 2017-01-30: 10 mL via PERINEURAL

## 2017-01-30 MED ORDER — ZOLPIDEM TARTRATE 5 MG PO TABS
5.0000 mg | ORAL_TABLET | Freq: Every evening | ORAL | Status: DC | PRN
  Filled 2017-01-30: qty 1

## 2017-01-30 MED ORDER — HYDROMORPHONE HCL-NACL 0.5-0.9 MG/ML-% IV SOSY
0.5000 mg | PREFILLED_SYRINGE | INTRAVENOUS | Status: DC | PRN
  Administered 2017-01-30 – 2017-01-31 (×2): 0.5 mg via INTRAVENOUS
  Filled 2017-01-30 (×2): qty 1

## 2017-01-30 MED ORDER — SODIUM CHLORIDE 0.9 % IR SOLN
Status: DC | PRN
Start: 2017-01-30 — End: 2017-01-30
  Administered 2017-01-30: 1000 mL

## 2017-01-30 MED ORDER — KETOROLAC TROMETHAMINE 30 MG/ML IJ SOLN: 30.0000 mg | Freq: Once | INTRAMUSCULAR | Status: DC | PRN

## 2017-01-30 MED ORDER — HYDROMORPHONE HCL-NACL 0.5-0.9 MG/ML-% IV SOSY
PREFILLED_SYRINGE | INTRAVENOUS | Status: AC
  Administered 2017-01-30: 0.5 mg via INTRAVENOUS
  Filled 2017-01-30: qty 4

## 2017-01-30 MED ORDER — ACETAMINOPHEN 650 MG RE SUPP: 650.0000 mg | Freq: Four times a day (QID) | RECTAL | Status: DC | PRN

## 2017-01-30 MED ORDER — SODIUM CHLORIDE 0.9 % IV SOLN
INTRAVENOUS | Status: DC
  Administered 2017-01-31: 03:00:00 via INTRAVENOUS

## 2017-01-30 MED ORDER — ALUM & MAG HYDROXIDE-SIMETH 200-200-20 MG/5ML PO SUSP: 30.0000 mL | ORAL | Status: DC | PRN

## 2017-01-30 MED ORDER — DEXAMETHASONE SODIUM PHOSPHATE 10 MG/ML IJ SOLN
10.0000 mg | Freq: Two times a day (BID) | INTRAMUSCULAR | Status: AC
  Administered 2017-01-30 – 2017-01-31 (×3): 10 mg via INTRAVENOUS
  Filled 2017-01-30 (×3): qty 1

## 2017-01-30 MED ORDER — ONDANSETRON HCL 4 MG/2ML IJ SOLN
INTRAMUSCULAR | Status: DC | PRN
  Administered 2017-01-30: 4 mg via INTRAVENOUS

## 2017-01-30 MED ORDER — LIDOCAINE 2% (20 MG/ML) 5 ML SYRINGE
INTRAMUSCULAR | Status: AC
  Filled 2017-01-30: qty 5

## 2017-01-30 MED ORDER — LEVOTHYROXINE SODIUM 75 MCG PO TABS
75.0000 ug | ORAL_TABLET | Freq: Every day | ORAL | Status: DC
  Administered 2017-01-31 – 2017-02-02 (×3): 75 ug via ORAL
  Filled 2017-01-30 (×3): qty 1

## 2017-01-30 MED ORDER — DEXAMETHASONE SODIUM PHOSPHATE 10 MG/ML IJ SOLN
INTRAMUSCULAR | Status: DC | PRN
  Administered 2017-01-30: 10 mg via INTRAVENOUS

## 2017-01-30 MED ORDER — BUPIVACAINE-EPINEPHRINE 0.5% -1:200000 IJ SOLN
INTRAMUSCULAR | Status: DC | PRN
  Administered 2017-01-30: 30 mL

## 2017-01-30 MED ORDER — SODIUM CHLORIDE 0.9 % IV SOLN
1000.0000 mg | INTRAVENOUS | Status: DC
  Filled 2017-01-30: qty 10

## 2017-01-30 MED ORDER — OXYCODONE-ACETAMINOPHEN 5-325 MG PO TABS: 1.0000 | ORAL_TABLET | Freq: Four times a day (QID) | ORAL | 0 refills | Status: DC | PRN

## 2017-01-30 MED ORDER — CEFAZOLIN SODIUM-DEXTROSE 2-4 GM/100ML-% IV SOLN
INTRAVENOUS | Status: AC
  Filled 2017-01-30: qty 100

## 2017-01-30 MED ORDER — MAGNESIUM CITRATE PO SOLN: 1.0000 | Freq: Once | ORAL | Status: DC | PRN

## 2017-01-30 MED ORDER — CEFAZOLIN SODIUM-DEXTROSE 2-4 GM/100ML-% IV SOLN
2.0000 g | INTRAVENOUS | Status: AC
  Administered 2017-01-30: 2 g via INTRAVENOUS

## 2017-01-30 MED ORDER — SODIUM CHLORIDE 0.9 % IJ SOLN
INTRAMUSCULAR | Status: AC
  Filled 2017-01-30: qty 50

## 2017-01-30 MED ORDER — ROCURONIUM BROMIDE 50 MG/5ML IV SOSY
PREFILLED_SYRINGE | INTRAVENOUS | Status: AC
  Filled 2017-01-30: qty 5

## 2017-01-30 MED ORDER — DEXTROSE 5 % IV SOLN
500.0000 mg | Freq: Four times a day (QID) | INTRAVENOUS | Status: DC | PRN
  Administered 2017-01-30: 500 mg via INTRAVENOUS
  Filled 2017-01-30: qty 550

## 2017-01-30 MED ORDER — DOCUSATE SODIUM 100 MG PO CAPS
100.0000 mg | ORAL_CAPSULE | Freq: Two times a day (BID) | ORAL | Status: DC
  Administered 2017-01-31 – 2017-02-02 (×5): 100 mg via ORAL
  Filled 2017-01-30 (×6): qty 1

## 2017-01-30 SURGICAL SUPPLY — 56 items
APL SKNCLS STERI-STRIP NONHPOA (GAUZE/BANDAGES/DRESSINGS) ×1
BAG SPEC THK2 15X12 ZIP CLS (MISCELLANEOUS) ×1
BAG ZIPLOCK 12X15 (MISCELLANEOUS) ×2 IMPLANT
BANDAGE ACE 4X5 VEL STRL LF (GAUZE/BANDAGES/DRESSINGS) ×2 IMPLANT
BANDAGE ACE 6X5 VEL STRL LF (GAUZE/BANDAGES/DRESSINGS) ×2 IMPLANT
BENZOIN TINCTURE PRP APPL 2/3 (GAUZE/BANDAGES/DRESSINGS) ×2 IMPLANT
BLADE SAG 18X100X1.27 (BLADE) ×2 IMPLANT
BLADE SAW SGTL 13.0X1.19X90.0M (BLADE) ×2 IMPLANT
BOOTIES KNEE HIGH SLOAN (MISCELLANEOUS) ×2 IMPLANT
BOWL SMART MIX CTS (DISPOSABLE) ×2 IMPLANT
CAPT KNEE TOTAL 3 ATTUNE ×1 IMPLANT
CEMENT HV SMART SET (Cement) ×3 IMPLANT
CUFF TOURN SGL QUICK 34 (TOURNIQUET CUFF) ×2
CUFF TRNQT CYL 34X4X40X1 (TOURNIQUET CUFF) ×1 IMPLANT
DRAPE U-SHAPE 47X51 STRL (DRAPES) ×2 IMPLANT
DRSG ADAPTIC 3X8 NADH LF (GAUZE/BANDAGES/DRESSINGS) ×2 IMPLANT
DRSG AQUACEL AG ADV 3.5X10 (GAUZE/BANDAGES/DRESSINGS) ×2 IMPLANT
DRSG MEPILEX BORDER 4X8 (GAUZE/BANDAGES/DRESSINGS) ×2 IMPLANT
DRSG PAD ABDOMINAL 8X10 ST (GAUZE/BANDAGES/DRESSINGS) ×2 IMPLANT
DURAPREP 26ML APPLICATOR (WOUND CARE) ×2 IMPLANT
ELECT REM PT RETURN 15FT ADLT (MISCELLANEOUS) ×2 IMPLANT
GAUZE SPONGE 4X4 12PLY STRL (GAUZE/BANDAGES/DRESSINGS) ×2 IMPLANT
GLOVE BIO SURGEON STRL SZ8 (GLOVE) ×2 IMPLANT
GLOVE BIOGEL PI IND STRL 8 (GLOVE) ×2 IMPLANT
GLOVE BIOGEL PI INDICATOR 8 (GLOVE) ×2
GLOVE ECLIPSE 7.5 STRL STRAW (GLOVE) ×2 IMPLANT
GLOVE ORTHO TXT STRL SZ7.5 (GLOVE) ×4 IMPLANT
GOWN SPEC L4 XLG W/TWL (GOWN DISPOSABLE) ×4 IMPLANT
HANDPIECE INTERPULSE COAX TIP (DISPOSABLE) ×2
HOOD PEEL AWAY FLYTE STAYCOOL (MISCELLANEOUS) ×4 IMPLANT
IMMOBILIZER KNEE 20 (SOFTGOODS) ×2
IMMOBILIZER KNEE 20 THIGH 36 (SOFTGOODS) IMPLANT
MANIFOLD NEPTUNE II (INSTRUMENTS) ×2 IMPLANT
NS IRRIG 1000ML POUR BTL (IV SOLUTION) ×2 IMPLANT
PACK ICE MAXI GEL EZY WRAP (MISCELLANEOUS) ×2 IMPLANT
PACK TOTAL KNEE CUSTOM (KITS) ×1 IMPLANT
PAD ABD 8X10 STRL (GAUZE/BANDAGES/DRESSINGS) ×1 IMPLANT
PADDING CAST COTTON 6X4 STRL (CAST SUPPLIES) ×3 IMPLANT
POSITIONER SURGICAL ARM (MISCELLANEOUS) ×2 IMPLANT
SET HNDPC FAN SPRY TIP SCT (DISPOSABLE) ×1 IMPLANT
STAPLER VISISTAT 35W (STAPLE) ×1 IMPLANT
STRIP CLOSURE SKIN 1/2X4 (GAUZE/BANDAGES/DRESSINGS) ×2 IMPLANT
SUT MNCRL AB 3-0 PS2 18 (SUTURE) ×2 IMPLANT
SUT VIC AB 0 CT1 36 (SUTURE) ×4 IMPLANT
SUT VIC AB 1 CT1 36 (SUTURE) ×4 IMPLANT
SUT VIC AB 2-0 CT1 27 (SUTURE) ×2
SUT VIC AB 2-0 CT1 TAPERPNT 27 (SUTURE) ×1 IMPLANT
SWABSTK COMLB BENZOIN TINCTURE (MISCELLANEOUS) ×2 IMPLANT
SYR 20CC LL (SYRINGE) ×2 IMPLANT
SYR 50ML LL SCALE MARK (SYRINGE) ×2 IMPLANT
TOWEL OR NON WOVEN STRL DISP B (DISPOSABLE) ×1 IMPLANT
TRAY FOLEY BAG SILVER LF 16FR (CATHETERS) ×1 IMPLANT
TRAY FOLEY W/METER SILVER 14FR (SET/KITS/TRAYS/PACK) ×1 IMPLANT
WATER STERILE IRR 1000ML POUR (IV SOLUTION) ×3 IMPLANT
WRAP KNEE MAXI GEL POST OP (GAUZE/BANDAGES/DRESSINGS) ×1 IMPLANT
YANKAUER SUCT BULB TIP NO VENT (SUCTIONS) ×2 IMPLANT

## 2017-01-30 NOTE — [place] FL2 (Signed)
[place] MEDICAID FL2 LEVEL OF CARE SCREENING TOOL     IDENTIFICATION  Patient Name: Debbie Richards Birthdate: November 18, 1930 Sex: female Admission Date (Current Location): 01/30/2017  [facility] and [place] Number:  Herbalist and Address:  [facility],  [place] [address], [place]      Provider Number: [id number]  Attending Physician Name and Address:  Dorna Leitz, MD  Relative Name and Phone Number:       Current Level of Care: Hospital Recommended Level of Care: [place] Prior Approval Number:    Date Approved/Denied:   PASRR Number: [id number]  Discharge Plan: SNF    Current Diagnoses: Patient Active Problem List   Diagnosis Date Noted  . Primary osteoarthritis of right knee 01/30/2017  . Primary osteoarthritis of left knee 01/30/2017  . Acute diverticulitis 01/11/2016  . Acute UTI 01/11/2016  . Acute kidney injury ([place]) 01/11/2016  . Hypothyroidism 01/11/2016  . Mild HTN 01/11/2016  . HLD (hyperlipidemia) 01/11/2016  . Biliary colic 46/27/0350  . Shortness of breath on exertion 09/09/2011  . History of pulmonary embolus (PE) 09/09/2011  . History of DVT (deep vein thrombosis) 09/09/2011  . Anticoagulated on Coumadin 09/09/2011  . Fibromyalgia 09/09/2011  . Goiter 09/09/2011    Orientation RESPIRATION BLADDER Height & Weight     Self, Time, Situation, Place  O2 Indwelling catheter Weight: 111.1 kg (245 lb) Height:  5\' 10"  (177.8 cm)  BEHAVIORAL SYMPTOMS/MOOD NEUROLOGICAL BOWEL NUTRITION STATUS  Other (Comment) (no behaviors)   Continent Diet (see dc summary)  AMBULATORY STATUS COMMUNICATION OF NEEDS Skin   Extensive Assist Verbally Surgical wounds                       Personal Care Assistance Level of Assistance  Bathing, Feeding, Dressing Bathing Assistance: Limited assistance Feeding assistance: Independent Dressing Assistance: Limited assistance     Functional Limitations Info  Sight,  Hearing, Speech Sight Info: Adequate Hearing Info: Adequate Speech Info: Adequate    SPECIAL CARE FACTORS FREQUENCY  PT (By licensed PT), OT (By licensed OT)     PT Frequency: 5x wk OT Frequency: 5x wk            Contractures Contractures Info: Not present    Additional Factors Info  Code Status Code Status Info: Full Code             Current Medications (01/30/2017):  This is the current hospital active medication list Current Facility-Administered Medications  Medication Dose Route Frequency Provider Last Rate Last Dose  . 0.9 %  sodium chloride infusion   Intravenous Continuous Gary Fleet, PA-C 100 mL/hr at 01/30/17 1229    . acetaminophen (TYLENOL) tablet 650 mg  650 mg Oral Q6H PRN Gary Fleet, PA-C       Or  . acetaminophen (TYLENOL) suppository 650 mg  650 mg Rectal Q6H PRN Gary Fleet, PA-C      . alum & mag hydroxide-simeth (MAALOX/MYLANTA) 200-200-20 MG/5ML suspension 30 mL  30 mL Oral Q4H PRN Gary Fleet, PA-C      . [START ON 01/31/2017] atenolol (TENORMIN) tablet 25 mg  25 mg Oral Daily Gary Fleet, PA-C      . bisacodyl (DULCOLAX) EC tablet 5 mg  5 mg Oral Daily PRN Gary Fleet, PA-C      . ceFAZolin (ANCEF) IVPB 2g/100 mL premix  2 g Intravenous Q6H Gary Fleet, PA-C 200 mL/hr at 01/30/17 1313 2 g at 01/30/17 1313  .  celecoxib (CELEBREX) capsule 200 mg  200 mg Oral Q12H Gary Fleet, PA-C   200 mg at 01/30/17 1223  . colchicine tablet 0.6 mg  0.6 mg Oral Daily Gary Fleet, PA-C   0.6 mg at 01/30/17 1223  . dexamethasone (DECADRON) injection 10 mg  10 mg Intravenous Q12H Gary Fleet, PA-C   10 mg at 01/30/17 1223  . diphenhydrAMINE (BENADRYL) 12.5 MG/5ML elixir 12.5-25 mg  12.5-25 mg Oral Q4H PRN Gary Fleet, PA-C      . docusate sodium (COLACE) capsule 100 mg  100 mg Oral BID Gary Fleet, PA-C      . furosemide (LASIX) tablet 20 mg  20 mg Oral Daily Gary Fleet, PA-C   20 mg at 01/30/17 1040  . gabapentin (NEURONTIN)  capsule 600 mg  600 mg Oral QHS Gary Fleet, PA-C      . HYDROmorphone (DILAUDID) injection 0.5-1 mg  0.5-1 mg Intravenous Q3H PRN Gary Fleet, PA-C   0.5 mg at 01/30/17 1156  . [START ON 01/31/2017] levothyroxine (SYNTHROID, LEVOTHROID) tablet 75 mcg  75 mcg Oral QAC breakfast Gary Fleet, PA-C      . magnesium citrate solution 1 Bottle  1 Bottle Oral Once PRN Gary Fleet, PA-C      . methocarbamol (ROBAXIN) tablet 500 mg  500 mg Oral Q6H PRN Gary Fleet, PA-C       Or  . methocarbamol (ROBAXIN) 500 mg in dextrose 5 % 50 mL IVPB  500 mg Intravenous Q6H PRN Gary Fleet, PA-C 110 mL/hr at 01/30/17 1028 500 mg at 01/30/17 1028  . ondansetron (ZOFRAN) tablet 4 mg  4 mg Oral Q6H PRN Gary Fleet, PA-C   4 mg at 01/30/17 1223   Or  . ondansetron (ZOFRAN) injection 4 mg  4 mg Intravenous Q6H PRN Gary Fleet, PA-C      . oxyCODONE (Oxy IR/ROXICODONE) immediate release tablet 5-10 mg  5-10 mg Oral Q3H PRN Gary Fleet, PA-C   5 mg at 01/30/17 1313  . pantoprazole (PROTONIX) EC tablet 40 mg  40 mg Oral Daily Gary Fleet, PA-C   40 mg at 01/30/17 1223  . polyethylene glycol (MIRALAX / GLYCOLAX) packet 17 g  17 g Oral Daily PRN Gary Fleet, PA-C      . pravastatin (PRAVACHOL) tablet 20 mg  20 mg Oral q1800 Gary Fleet, PA-C      . zolpidem (AMBIEN) tablet 5 mg  5 mg Oral QHS PRN Gary Fleet, PA-C         Discharge Medications: Please see discharge summary for a list of discharge medications.  Relevant Imaging Results:  Relevant Lab Results:   Additional Information SS # [id number]  Tarrell Debes, Randall An, LCSW

## 2017-01-30 NOTE — H&P (Signed)
TOTAL KNEE ADMISSION H&P  Patient is being admitted for right total knee arthroplasty.  Subjective:  Chief Complaint:right knee pain.  HPI: Debbie Richards, 83 y.o. female, has a history of pain and functional disability in the right knee due to arthritis and has failed non-surgical conservative treatments for greater than 12 weeks to includeNSAID's and/or analgesics, corticosteriod injections, viscosupplementation injections, weight reduction as appropriate and activity modification.  Onset of symptoms was gradual, starting 9 years ago with gradually worsening course since that time. The patient noted no past surgery on the right knee(s).  Patient currently rates pain in the right knee(s) at 8 out of 10 with activity. Patient has night pain, worsening of pain with activity and weight bearing, pain that interferes with activities of daily living, pain with passive range of motion, crepitus and joint swelling.  Patient has evidence of periarticular osteophytes, joint subluxation and joint space narrowing by imaging studies. This patient has had Failure of all reasonable conservative care. There is no active infection.  Patient Active Problem List   Diagnosis Date Noted  . Acute diverticulitis 01/11/2016  . Acute UTI 01/11/2016  . Acute kidney injury ([place]) 01/11/2016  . Hypothyroidism 01/11/2016  . Mild HTN 01/11/2016  . HLD (hyperlipidemia) 01/11/2016  . Biliary colic 95/62/1308  . Shortness of breath on exertion 09/09/2011  . History of pulmonary embolus (PE) 09/09/2011  . History of DVT (deep vein thrombosis) 09/09/2011  . Anticoagulated on Coumadin 09/09/2011  . Fibromyalgia 09/09/2011  . Goiter 09/09/2011   Past Medical History:  Diagnosis Date  . Borderline diabetes   . Chronic lower back pain   . Clotting disorder ([place])   . DVT, lower extremity ([place]) 01/2002; 06/2003   RLE; LLE  . Enlarged thyroid gland   . 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 Fibromyalgia   . Gallstones    . GERD (gastroesophageal reflux disease)   . H/O hiatal hernia   . History of stomach ulcers   . Hyperlipidemia   . Hypertension   . Hypothyroidism   . Migraine    "I've had 4; last time was when I was in my ?30's"  . Osteoarthritis   . Phlebitis    LLE  . Pneumonia   . Pulmonary embolism ([place]) 2003  . Shortness of breath on exertion     Past Surgical History:  Procedure Laterality Date  . ABDOMINAL HYSTERECTOMY  1972  . APPENDECTOMY  1960  . bladder tack    . BREAST BIOPSY     left  . [place]   right  . CATARACT EXTRACTION, BILATERAL  ~ 2011  . CHOLECYSTECTOMY  09/12/2011   Procedure: LAPAROSCOPIC CHOLECYSTECTOMY WITH INTRAOPERATIVE CHOLANGIOGRAM;  Surgeon: Shann Medal, MD;  Location: [place];  Service: General;  Laterality: N/A;  . excision of goiter    . EXTERNAL EAR SURGERY     x3  . HERNIA REPAIR  1970's   ventral  . MASS EXCISION  12/2001   paravertebral chest mass  . [place]; 1982; after 1982  . mini thoracotomy  12/2001  . THORACOTOMY  2003   procedure done   to remove growth in lung     Prescriptions Prior to Admission  Medication Sig Dispense Refill Last Dose  . atenolol (TENORMIN) 25 MG tablet Take 25 mg by mouth daily.   01/30/2017 at 0415  . colchicine 0.6 MG tablet Take 0.6 mg by mouth daily.   01/29/2017 at Unknown  time  . furosemide (LASIX) 20 MG tablet Take 20 mg by mouth daily.    Past Week at Unknown time  . gabapentin (NEURONTIN) 300 MG capsule Take 600 mg by mouth at bedtime.    01/29/2017 at Unknown time  . levothyroxine (SYNTHROID, LEVOTHROID) 75 MCG tablet Take 75 mcg by mouth daily before breakfast.    01/30/2017 at 0415  . lovastatin (MEVACOR) 20 MG tablet Take 1 tablet by mouth every other day.   Past Week at Unknown time  . OMEGA-3 FATTY ACIDS PO Take 2 capsules by mouth daily.    01/24/2017  . omeprazole (PRILOSEC) 20 MG capsule Take 20 mg by mouth daily as needed (indigestion).    01/29/2017 at Unknown time   . Vitamin D, Ergocalciferol, (DRISDOL) 50000 units CAPS capsule Take 50,000 Units by mouth every 7 (seven) days. Friday   01/23/2017  . warfarin (COUMADIN) 1 MG tablet Take 2 tablets (2 mg total) by mouth one time only at 6 PM. You need INR check tomorrow 8-07, and coumadin dose need to be adjusted as needed. (Patient taking differently: Take 2 mg by mouth one time only at 6 PM. You need INR check tomorrow 8-07, and coumadin dose need to be adjusted as needed. Takes 5 mg tablet along with 2 1 mg tablets to equal 7 mg daily except on Saturdays and takes 5 mg plus 3  1 mg tablets to equal 8 mg) 30 tablet 0   . warfarin (COUMADIN) 5 MG tablet Take 5 mg by mouth daily at 6 PM. Takes 5 mg tablet along with 2 1 mg tablets to equal 7 mg daily except on Saturdays and takes 5 mg plus 3  1 mg tablets to equal 8 mg   01/24/2017  . acetaminophen (TYLENOL) 650 MG CR tablet Take 1,300 mg by mouth every 8 (eight) hours as needed for pain.   More than a month at Unknown time  . amoxicillin-clavulanate (AUGMENTIN) 875-125 MG tablet Take 1 tablet by mouth 2 (two) times daily. (Patient not taking: Reported on 01/21/2017) 14 tablet 0 Not Taking at Unknown time   No Known Allergies  Social History  Substance Use Topics  . Smoking status: Never Smoker  . Smokeless tobacco: Never Used  . Alcohol use No    Family History  Problem Relation Age of Onset  . Heart disease Mother        had CHF  . Stroke Father      ROS ROS: I have reviewed the patient's review of systems thoroughly and there are no positive responses as relates to the HPI. Objective:  Physical Exam  Vital signs in last 24 hours: Temp:  [98.5 F (36.9 C)] 98.5 F (36.9 C) (08/24 0544) Pulse Rate:  [80] 80 (08/24 0544) Resp:  [18] 18 (08/24 0544) BP: (102)/(54) 102/54 (08/24 0544) SpO2:  [98 %] 98 % (08/24 0544) Weight:  [111.1 kg (245 lb)] 111.1 kg (245 lb) (08/24 0610) Well-developed well-nourished patient in no acute distress. Alert and  oriented x3 HEENT:within normal limits Cardiac: Regular rate and rhythm Pulmonary: Lungs clear to auscultation Abdomen: Soft and nontender.  Normal active bowel sounds  Musculoskeletal: (Right knee: Obvious valgus malalignment. Trace effusion. No instability. Painful range of motion with grinding. Range of motion is 0-100. Labs: Recent Results (from the past 2160 hour(s))  Urinalysis, Routine w reflex microscopic     Status: Abnormal   Collection Time: 01/23/17  1:10 PM  Result Value Ref Range   Color,  Urine AMBER (A) YELLOW    Comment: BIOCHEMICALS MAY BE AFFECTED BY COLOR   APPearance TURBID (A) CLEAR   Specific Gravity, Urine 1.026 1.005 - 1.030   pH 5.0 5.0 - 8.0   Glucose, UA NEGATIVE NEGATIVE mg/dL   Hgb urine dipstick NEGATIVE NEGATIVE   Bilirubin Urine NEGATIVE NEGATIVE   Ketones, ur NEGATIVE NEGATIVE mg/dL   Protein, ur 30 (A) NEGATIVE mg/dL   Nitrite NEGATIVE NEGATIVE   Leukocytes, UA LARGE (A) NEGATIVE   RBC / HPF 0-5 0 - 5 RBC/hpf   WBC, UA TOO NUMEROUS TO COUNT 0 - 5 WBC/hpf   Bacteria, UA FEW (A) NONE SEEN   Squamous Epithelial / LPF 0-5 (A) NONE SEEN   Mucus PRESENT   Surgical pcr screen     Status: None   Collection Time: 01/23/17  1:15 PM  Result Value Ref Range   MRSA, PCR NEGATIVE NEGATIVE   Staphylococcus aureus NEGATIVE NEGATIVE    Comment:        The Xpert SA Assay (FDA approved for NASAL specimens in patients over 66 years of age), is one component of a comprehensive surveillance program.  Test performance has been validated by [facility] for patients greater than or equal to 69 year old. It is not intended to diagnose infection nor to guide or monitor treatment.   Type and screen Order type and screen if day of surgery is less than 15 days from draw of preadmission visit or order morning of surgery if day of surgery is greater than 6 days from preadmission visit.     Status: None   Collection Time: 01/23/17  1:56 PM  Result Value Ref Range    ABO/RH(D) A POS    Antibody Screen NEG    Sample Expiration 02/02/2017    Extend sample reason NO TRANSFUSIONS OR PREGNANCY IN THE PAST 3 MONTHS   ABO/Rh     Status: None   Collection Time: 01/23/17  1:56 PM  Result Value Ref Range   ABO/RH(D) A POS   Hemoglobin A1c     Status: Abnormal   Collection Time: 01/23/17  2:00 PM  Result Value Ref Range   Hgb A1c MFr Bld 5.8 (H) 4.8 - 5.6 %    Comment: (NOTE) Pre diabetes:          5.7%-6.4% Diabetes:              >6.4% Glycemic control for   <7.0% adults with diabetes    Mean Plasma Glucose 119.76 mg/dL    Comment: Performed at [facility] [address], [place], [place] [address]  APTT     Status: Abnormal   Collection Time: 01/23/17  2:00 PM  Result Value Ref Range   aPTT 40 (H) 24 - 36 seconds    Comment:        IF BASELINE aPTT IS ELEVATED, SUGGEST PATIENT RISK ASSESSMENT BE USED TO DETERMINE APPROPRIATE ANTICOAGULANT THERAPY.   CBC WITH DIFFERENTIAL     Status: Abnormal   Collection Time: 01/23/17  2:00 PM  Result Value Ref Range   WBC 7.8 4.0 - 10.5 K/uL   RBC 4.63 3.87 - 5.11 MIL/uL   Hemoglobin 14.5 12.0 - 15.0 g/dL   HCT 43.7 36.0 - 46.0 %   MCV 94.4 78.0 - 100.0 fL   MCH 31.3 26.0 - 34.0 pg   MCHC 33.2 30.0 - 36.0 g/dL   RDW 12.9 11.5 - 15.5 %   Platelets 145 (L)  150 - 400 K/uL   Neutrophils Relative % 66 %   Neutro Abs 5.2 1.7 - 7.7 K/uL   Lymphocytes Relative 25 %   Lymphs Abs 2.0 0.7 - 4.0 K/uL   Monocytes Relative 7 %   Monocytes Absolute 0.5 0.1 - 1.0 K/uL   Eosinophils Relative 2 %   Eosinophils Absolute 0.2 0.0 - 0.7 K/uL   Basophils Relative 0 %   Basophils Absolute 0.0 0.0 - 0.1 K/uL  Comprehensive metabolic panel     Status: Abnormal   Collection Time: 01/23/17  2:00 PM  Result Value Ref Range   Sodium 145 135 - 145 mmol/L   Potassium 5.5 (H) 3.5 - 5.1 mmol/L   Chloride 110 101 - 111 mmol/L   CO2 26 22 - 32 mmol/L   Glucose, Bld 121 (H) 65 - 99 mg/dL   BUN 27 (H) 6 - 20 mg/dL    Creatinine, Ser 1.31 (H) 0.44 - 1.00 mg/dL   Calcium 9.8 8.9 - 10.3 mg/dL   Total Protein 7.4 6.5 - 8.1 g/dL   Albumin 4.6 3.5 - 5.0 g/dL   AST 25 15 - 41 U/L   ALT 12 (L) 14 - 54 U/L   Alkaline Phosphatase 55 38 - 126 U/L   Total Bilirubin 0.9 0.3 - 1.2 mg/dL   GFR calc non Af Amer 36 (L) >60 mL/min   GFR calc Af Amer 42 (L) >60 mL/min    Comment: (NOTE) The eGFR has been calculated using the CKD EPI equation. This calculation has not been validated in all clinical situations. eGFR's persistently <60 mL/min signify possible Chronic Kidney Disease.    Anion gap 9 5 - 15  Protime-INR     Status: Abnormal   Collection Time: 01/23/17  2:00 PM  Result Value Ref Range   Prothrombin Time 23.6 (H) 11.4 - 15.2 seconds   INR 2.06     Estimated body mass index is 35.15 kg/m as calculated from the following:   Height as of this encounter: 5' 10"  (1.778 m).   Weight as of this encounter: 111.1 kg (245 lb).   Imaging Review Plain radiographs demonstrate severe degenerative joint disease of the right knee(s). The overall alignment ismild valgus. The bone quality appears to be fair for age and reported activity level.  Assessment/Plan:  End stage arthritis, right knee   The patient history, physical examination, clinical judgment of the provider and imaging studies are consistent with end stage degenerative joint disease of the right knee(s) and total knee arthroplasty is deemed medically necessary. The treatment options including medical management, injection therapy arthroscopy and arthroplasty were discussed at length. The risks and benefits of total knee arthroplasty were presented and reviewed. The risks due to aseptic loosening, infection, stiffness, patella tracking problems, thromboembolic complications and other imponderables were discussed. The patient acknowledged the explanation, agreed to proceed with the plan and consent was signed. Patient is being admitted for inpatient  treatment for surgery, pain control, PT, OT, prophylactic antibiotics, VTE prophylaxis, progressive ambulation and ADL's and discharge planning. The patient is planning to be discharged to skilled nursing facility

## 2017-01-30 NOTE — Op Note (Deleted)
  The note originally documented on this encounter has been moved the the encounter in which it belongs.  

## 2017-01-30 NOTE — Discharge Instructions (Addendum)
Information about your medication: Anticoagulant  Generic Name (Brand): Rivaroxaban (Xarelto)  Rivaroxaban is used to reduce the risk of forming blood clots that cause a stroke due to an irregular heartbeat.  Common SIDE EFFECTS you may experience include: extremity pain and increased risk of bleeding or bruising.  If a dose is missed, it should be taken as soon as possible on the same day.  For the 15 mg twice daily schedule, 2 tablets may be taken together if necessary.  Multiple drug-drug interactions exist for this medication.  Consult healthcare professional prior to starting a new drug.  Tell your physicians and dentists that you are taking this drug before elective surgery or invasive procedures and before any new drug is prescribed.  Contact your health care provider if you experience: any signs of blood loss or unusual bruising or bleeding. INSTRUCTIONS AFTER JOINT REPLACEMENT   o Remove items at home which could result in a fall. This includes throw rugs or furniture in walking pathways o ICE to the affected joint every three hours while awake for 30 minutes at a time, for at least the first 3-5 days, and then as needed for pain and swelling.  Continue to use ice for pain and swelling. You may notice swelling that will progress down to the foot and ankle.  This is normal after surgery.  Elevate your leg when you are not up walking on it.   o Continue to use the breathing machine you got in the hospital (incentive spirometer) which will help keep your temperature down.  It is common for your temperature to cycle up and down following surgery, especially at night when you are not up moving around and exerting yourself.  The breathing machine keeps your lungs expanded and your temperature down.   DIET:  As you were doing prior to hospitalization, we recommend a well-balanced diet.  DRESSING / WOUND CARE / SHOWERING  Keep the surgical dressing until follow up.  The dressing is water  proof, so you can shower without any extra covering.  IF THE DRESSING FALLS OFF or the wound gets wet inside, change the dressing with sterile gauze.  Please use good hand washing techniques before changing the dressing.  Do not use any lotions or creams on the incision until instructed by your surgeon.    ACTIVITY  o Increase activity slowly as tolerated, but follow the weight bearing instructions below.   o No driving for 6 weeks or until further direction given by your physician.  You cannot drive while taking narcotics.  o No lifting or carrying greater than 10 lbs. until further directed by your surgeon. o Avoid periods of inactivity such as sitting longer than an hour when not asleep. This helps prevent blood clots.  o You may return to work once you are authorized by your doctor.     WEIGHT BEARING   Weight bearing as tolerated with assist device (walker, cane, etc) as directed, use it as long as suggested by your surgeon or therapist, typically at least 4-6 weeks.   EXERCISES  Results after joint replacement surgery are often greatly improved when you follow the exercise, range of motion and muscle strengthening exercises prescribed by your doctor. Safety measures are also important to protect the joint from further injury. Any time any of these exercises cause you to have increased pain or swelling, decrease what you are doing until you are comfortable again and then slowly increase them. If you have problems or questions, call  your caregiver or physical therapist for advice.   Rehabilitation is important following a joint replacement. After just a few days of immobilization, the muscles of the leg can become weakened and shrink (atrophy).  These exercises are designed to build up the tone and strength of the thigh and leg muscles and to improve motion. Often times heat used for twenty to thirty minutes before working out will loosen up your tissues and help with improving the range of  motion but do not use heat for the first two weeks following surgery (sometimes heat can increase post-operative swelling).   These exercises can be done on a training (exercise) mat, on the floor, on a table or on a bed. Use whatever works the best and is most comfortable for you.    Use music or television while you are exercising so that the exercises are a pleasant break in your day. This will make your life better with the exercises acting as a break in your routine that you can look forward to.   Perform all exercises about fifteen times, three times per day or as directed.  You should exercise both the operative leg and the other leg as well.  Exercises include:    Quad Sets - Tighten up the muscle on the front of the thigh (Quad) and hold for 5-10 seconds.    Straight Leg Raises - With your knee straight (if you were given a brace, keep it on), lift the leg to 60 degrees, hold for 3 seconds, and slowly lower the leg.  Perform this exercise against resistance later as your leg gets stronger.   Leg Slides: Lying on your back, slowly slide your foot toward your buttocks, bending your knee up off the floor (only go as far as is comfortable). Then slowly slide your foot back down until your leg is flat on the floor again.   Angel Wings: Lying on your back spread your legs to the side as far apart as you can without causing discomfort.   Hamstring Strength:  Lying on your back, push your heel against the floor with your leg straight by tightening up the muscles of your buttocks.  Repeat, but this time bend your knee to a comfortable angle, and push your heel against the floor.  You may put a pillow under the heel to make it more comfortable if necessary.   A rehabilitation program following joint replacement surgery can speed recovery and prevent re-injury in the future due to weakened muscles. Contact your doctor or a physical therapist for more information on knee rehabilitation.     CONSTIPATION  Constipation is defined medically as fewer than three stools per week and severe constipation as less than one stool per week.  Even if you have a regular bowel pattern at home, your normal regimen is likely to be disrupted due to multiple reasons following surgery.  Combination of anesthesia, postoperative narcotics, change in appetite and fluid intake all can affect your bowels.   YOU MUST use at least one of the following options; they are listed in order of increasing strength to get the job done.  They are all available over the counter, and you may need to use some, POSSIBLY even all of these options:    Drink plenty of fluids (prune juice may be helpful) and high fiber foods Colace 100 mg by mouth twice a day  Senokot for constipation as directed and as needed Dulcolax (bisacodyl), take with full glass of water  Miralax (polyethylene glycol) once or twice a day as needed.  If you have tried all these things and are unable to have a bowel movement in the first 3-4 days after surgery call either your surgeon or your primary doctor.    If you experience loose stools or diarrhea, hold the medications until you stool forms back up.  If your symptoms do not get better within 1 week or if they get worse, check with your doctor.  If you experience "the worst abdominal pain ever" or develop nausea or vomiting, please contact the office immediately for further recommendations for treatment.   ITCHING:  If you experience itching with your medications, try taking only a single pain pill, or even half a pain pill at a time.  You can also use Benadryl over the counter for itching or also to help with sleep.   TED HOSE STOCKINGS:  Use stockings on both legs until for at least 2 weeks or as directed by physician office. They may be removed at night for sleeping.  MEDICATIONS:  See your medication summary on the After Visit Summary that nursing will review with you.  You may have some  home medications which will be placed on hold until you complete the course of blood thinner medication.  It is important for you to complete the blood thinner medication as prescribed.  PRECAUTIONS:  If you experience chest pain or shortness of breath - call 911 immediately for transfer to the hospital emergency department.   If you develop a fever greater that 101 F, purulent drainage from wound, increased redness or drainage from wound, foul odor from the wound/dressing, or calf pain - CONTACT YOUR SURGEON.                                                   FOLLOW-UP APPOINTMENTS:  If you do not already have a post-op appointment, please call the office for an appointment to be seen by your surgeon.  Guidelines for how soon to be seen are listed in your After Visit Summary, but are typically between 1-4 weeks after surgery.  OTHER INSTRUCTIONS:   Knee Replacement:  Do not place pillow under knee, focus on keeping the knee straight while resting. CPM instructions: 0-90 degrees, 2 hours in the morning, 2 hours in the afternoon, and 2 hours in the evening. Place foam block, curve side up under heel at all times except when in CPM or when walking.  DO NOT modify, tear, cut, or change the foam block in any way.  MAKE SURE YOU:   Understand these instructions.   Get help right away if you are not doing well or get worse.    Thank you for letting us be a part of your medical care team.  It is a privilege we respect greatly.  We hope these instructions will help you stay on track for a fast and full recovery!    Information on my medicine - XARELTO (rivaroxaban)  WHY WAS XARELTO PRESCRIBED FOR YOU? Xarelto was prescribed to treat blood clots that may have been found in the veins of your legs (deep vein thrombosis) or in your lungs (pulmonary embolism) and to reduce the risk of them occurring again.  What do you need to know about Xarelto? The dose is one 20 mg tablet taken ONCE A DAY  with  your evening meal.  DO NOT stop taking Xarelto without talking to the health care provider who prescribed the medication.  Refill your prescription for 20 mg tablets before you run out.  After discharge, you should have regular check-up appointments with your healthcare provider that is prescribing your Xarelto.  In the future your dose may need to be changed if your kidney function changes by a significant amount.  What do you do if you miss a dose? If you are taking Xarelto ONCE DAILY and you miss a dose, take it as soon as you remember on the same day then continue your regularly scheduled once daily regimen the next day. Do not take two doses of Xarelto at the same time.   Important Safety Information Xarelto is a blood thinner medicine that can cause bleeding. You should call your healthcare provider right away if you experience any of the following: ? Bleeding from an injury or your nose that does not stop. ? Unusual colored urine (red or dark brown) or unusual colored stools (red or black). ? Unusual bruising for unknown reasons. ? A serious fall or if you hit your head (even if there is no bleeding).  Some medicines may interact with Xarelto and might increase your risk of bleeding while on Xarelto. To help avoid this, consult your healthcare provider or pharmacist prior to using any new prescription or non-prescription medications, including herbals, vitamins, non-steroidal anti-inflammatory drugs (NSAIDs) and supplements.  This website has more information on Xarelto: [email or web address]

## 2017-01-30 NOTE — Op Note (Signed)
Debbie Richards, ALAMEDA NO.:  [id number]  MEDICAL RECORD NO.:  [id number]  LOCATION:                               FACILITY:  [facility]  PHYSICIAN:  Alta Corning, M.D.   DATE OF BIRTH:  1930/09/20  DATE OF PROCEDURE:  01/30/2017 DATE OF DISCHARGE:                              OPERATIVE REPORT   PREOPERATIVE DIAGNOSIS:  End-stage degenerative joint disease, right knee with bone-on-bone change.  POSTOPERATIVE DIAGNOSIS:  End-stage degenerative joint disease, right knee with bone-on-bone change.  PROCEDURE:  Right total knee replacement with an Attune system size 5 femur, size 5 tibia, 7 mm bridging bearing, and a 38 mm all-polyethylene patella.  SURGEON:  Alta Corning, M.D.  ASSISTANT:  Gary Fleet, P.A.  ANESTHESIA:  General.  BRIEF HISTORY:  Debbie Richards is an 83 year old female with a long history of significant complaints of right knee pain.  She had been treated conservatively for a prolonged period of time.  After failure of all conservative care, she was taken to the operating room for right total knee replacement.  She was having night pain and light activity pain and had x-rays showing bone-on-bone change.  DESCRIPTION OF PROCEDURE:  The patient was taken to the operating room. After adequate anesthesia was obtained with general anesthetic, the patient was placed supine on the operating table.  The right leg was prepped and draped in usual sterile fashion.  Following this, the leg was exsanguinated.  Blood pressure tourniquet was inflated to 350 mmHg. Following this, a midline incision was made and the subcutaneous tissue was dissected down the level of the extensor mechanism, and a medial parapatellar arthrotomy was undertaken.  Once this was completed, attention was turned towards removal of the synovium in the anterior aspect of the femur, retropatellar fat pad, anterior and posterior cruciates, and the medial and lateral meniscus.  Following  this, attention was turned to the femur, an intramedullary pilot hole was drilled and a 5-degree valgus inclination cut was made.  The anterior and posterior cuts were made, chamfers and box following this. Following this, attention was turned to the tibia, it was cut perpendicular to its long axis and the tibia was then sized to a 5, it was drilled and keeled.  Following this, a size 5 was placed, a 5 on the femur and a 6 mm poly was placed.  Excellent full extension, range of motion looked to be a 7 on the final, I was thinking.  Attention turned to the patella, cut down to a level 13 mm.  A 38 paddle was chosen. Lugs were drilled and a trial was placed.  Knee put through a range of motion.  Excellent stability and range of motion were achieved.  At this point, all trials were removed.  Following this, the final trials, the knee was copiously and thoroughly lavaged, pulsatile lavage, irrigation and suctioned dry and then the final trials were cemented into place. Size 5 tibia, size 5 femur, and a 7 mm bridging bearing trial was placed and an excellent full extension was achieved here.  A 38 all poly patella was placed and held with a clamp.  All excess bone and cement  were removed.  The cement was allowed to completely harden.  Exparel was placed throughout the synovial reflection for postoperative pain control and tranexamic acid was placed into the wound for 5 minutes to give [place] a local control of bleeding.  Once this was completed, the tourniquet was let down.  All bleeding was controlled with electrocautery and the final poly was placed size 7 after range and stability testing.  The 7 was placed.  The medial parapatellar arthrotomy was closed with 1 Vicryl running, skin with 0 and 2-0 Vicryl, and 3-0 and skin staples given her size.  At this point, a sterile compressive dressing was applied and the patient was taken to recovery room and was noted to be in satisfactory condition.   Estimated blood loss for procedure was minimal.  Total tourniquet time was 54 minutes, but the final can be gotten from the anesthetic record.     Alta Corning, M.D.   ______________________________ Alta Corning, M.D.    Corliss Skains  D:  01/30/2017  T:  01/30/2017  Job:  [id number]

## 2017-01-30 NOTE — Anesthesia Procedure Notes (Signed)
Procedure Name: Intubation Date/Time: 01/30/2017 7:32 AM Performed by: Reinette Cuneo, Virgel Gess Pre-anesthesia Checklist: Patient identified, Emergency Drugs available, Suction available, Patient being monitored and Timeout performed Patient Re-evaluated:Patient Re-evaluated prior to induction Oxygen Delivery Method: Circle system utilized Preoxygenation: Pre-oxygenation with 100% oxygen Induction Type: IV induction Ventilation: Mask ventilation without difficulty Laryngoscope Size: Mac and 4 Grade View: Grade II Tube type: Oral Tube size: 7.5 mm Number of attempts: 1 Airway Equipment and Method: Stylet Placement Confirmation: ETT inserted through vocal cords under direct vision,  positive ETCO2,  CO2 detector and breath sounds checked- equal and bilateral Secured at: 22 cm Tube secured with: Tape Dental Injury: Teeth and Oropharynx as per pre-operative assessment

## 2017-01-30 NOTE — Brief Op Note (Signed)
01/30/2017  9:09 AM  PATIENT:  Debbie Richards  83 y.o. female  PRE-OPERATIVE DIAGNOSIS:  OSTEOARTHRITIS RIGHT KNEE  POST-OPERATIVE DIAGNOSIS:  OSTEOARTHRITIS RIGHT KNEE  PROCEDURE:  Procedure(s) with comments: RIGHT TOTAL KNEE ARTHROPLASTY (Right) - With adductor canal block  SURGEON:  Surgeon(s) and Role:    Dorna Leitz, MD - Primary  PHYSICIAN ASSISTANT:   ASSISTANTS: jim bethune   ANESTHESIA:   general  EBL:  Total I/O In: 1000 [I.V.:1000] Out: 400 [Urine:350; Blood:50]  BLOOD ADMINISTERED:none  DRAINS: none   LOCAL MEDICATIONS USED:  MARCAINE    and OTHER experel  SPECIMEN:  No Specimen  DISPOSITION OF SPECIMEN:  N/A  COUNTS:  YES  TOURNIQUET:   Total Tourniquet Time Documented: Thigh (Right) - 54 minutes Total: Thigh (Right) - 54 minutes   DICTATION: .Other Dictation: Dictation Number [id number]  PLAN OF CARE: Admit to inpatient   PATIENT DISPOSITION:  PACU - hemodynamically stable.   Delay start of Pharmacological VTE agent (>24hrs) due to surgical blood loss or risk of bleeding: no

## 2017-01-30 NOTE — Anesthesia Postprocedure Evaluation (Signed)
Anesthesia Post Note  Patient: Debbie Richards  Procedure(s) Performed: Procedure(s) (LRB): RIGHT TOTAL KNEE ARTHROPLASTY (Right)     Patient location during evaluation: PACU Anesthesia Type: General Level of consciousness: awake Pain management: satisfactory to patient Vital Signs Assessment: post-procedure vital signs reviewed and stable Respiratory status: spontaneous breathing Cardiovascular status: stable Postop Assessment: no signs of nausea or vomiting Anesthetic complications: no    Last Vitals:  Vitals:   01/30/17 1000 01/30/17 1015  BP: (!) 149/81 (!) 149/72  Pulse: 79 77  Resp: 15 11  Temp:    SpO2: 100% 100%    Last Pain:  Vitals:   01/30/17 1015  TempSrc:   PainSc: 6    Pain Goal: Patients Stated Pain Goal: 3 (01/30/17 0610)               Bertina Guthridge JR,JOHN Mateo Flow

## 2017-01-30 NOTE — Evaluation (Signed)
Physical Therapy Evaluation Patient Details Name: Debbie Richards MRN: [id number] DOB: [birth year] Today's Date: 01/30/2017   History of Present Illness  Pt s/p R TKR and with hx of nueropathy bilat feet and Fibromyalgia  Clinical Impression  Pt s/p R TKR and presents with functional mobility limitations 2* decreased R LE strength/ROM, post op pain, bilat LE neuropathy and obesity.  Pt would benefit from follow up rehab at SNF level to maximize IND and safety prior to return home with ltd assist.    Follow Up Recommendations SNF    Equipment Recommendations  None recommended by PT    Recommendations for Other Services OT consult     Precautions / Restrictions Precautions Precautions: Fall;Knee Required Braces or Orthoses: Knee Immobilizer - Right Knee Immobilizer - Right: Discontinue once straight leg raise with < 10 degree lag Restrictions Weight Bearing Restrictions: No Other Position/Activity Restrictions: WBAT      Mobility  Bed Mobility Overal bed mobility: Needs Assistance Bed Mobility: Supine to Sit     Supine to sit: Min assist;Mod assist;+2 for physical assistance;+2 for safety/equipment     General bed mobility comments: cues for sequence and use of L LE to self assist.  Physical assist for LE management and to bring trunk to upright position  Transfers Overall transfer level: Needs assistance Equipment used: Rolling walker (2 wheeled) Transfers: Sit to/from Stand Sit to Stand: Min assist;Mod assist;+2 physical assistance;+2 safety/equipment;From elevated surface         General transfer comment: cues for LE management and use of UEs to self assist  Ambulation/Gait Ambulation/Gait assistance: Min assist;+2 physical assistance;+2 safety/equipment Ambulation Distance (Feet): 18 Feet Assistive device: Rolling walker (2 wheeled) Gait Pattern/deviations: Decreased step length - right;Decreased step length - left;Shuffle;Trunk flexed Gait velocity:  decr Gait velocity interpretation: Below normal speed for age/gender General Gait Details: cues for posture, position from RW and initial sequence  Stairs            Wheelchair Mobility    Modified Rankin (Stroke Patients Only)       Balance Overall balance assessment: Needs assistance Sitting-balance support: No upper extremity supported;Feet supported Sitting balance-Leahy Scale: Good     Standing balance support: Bilateral upper extremity supported Standing balance-Leahy Scale: Poor                               Pertinent Vitals/Pain Pain Assessment: 0-10 Pain Score: 3  Pain Location: R knee Pain Descriptors / Indicators: Aching;Sore Pain Intervention(s): Limited activity within patient's tolerance;Monitored during session;Premedicated before session;Ice applied    Home Living Family/patient expects to be discharged to:: Skilled nursing facility Living Arrangements: Alone                    Prior Function Level of Independence: Independent with assistive device(s)         Comments: Pt used cane and RW as needed     Hand Dominance        Extremity/Trunk Assessment   Upper Extremity Assessment Upper Extremity Assessment: Overall WFL for tasks assessed    Lower Extremity Assessment Lower Extremity Assessment: RLE deficits/detail       Communication   Communication: No difficulties  Cognition Arousal/Alertness: Awake/alert Behavior During Therapy: WFL for tasks assessed/performed Overall Cognitive Status: Within Functional Limits for tasks assessed  General Comments      Exercises     Assessment/Plan    PT Assessment Patient needs continued PT services  PT Problem List Decreased strength;Decreased range of motion;Decreased activity tolerance;Decreased balance;Decreased mobility;Decreased knowledge of use of DME;Pain;Obesity       PT Treatment Interventions DME  instruction;Gait training;Stair training;Functional mobility training;Therapeutic activities;Therapeutic exercise;Patient/family education    PT Goals (Current goals can be found in the Care Plan section)  Acute Rehab PT Goals Patient Stated Goal: Rehab and then home with ltd assist PT Goal Formulation: With patient Time For Goal Achievement: 02/06/17 Potential to Achieve Goals: Good    Frequency 7X/week   Barriers to discharge        Co-evaluation               AM-PAC PT "6 Clicks" Daily Activity  Outcome Measure Difficulty turning over in bed (including adjusting bedclothes, sheets and blankets)?: Unable Difficulty moving from lying on back to sitting on the side of the bed? : Unable Difficulty sitting down on and standing up from a chair with arms (e.g., wheelchair, bedside commode, etc,.)?: Unable Help needed moving to and from a bed to chair (including a wheelchair)?: A Lot Help needed walking in hospital room?: A Little Help needed climbing 3-5 steps with a railing? : A Lot 6 Click Score: 10    End of Session Equipment Utilized During Treatment: Gait belt;Right knee immobilizer Activity Tolerance: Patient tolerated treatment well Patient left: in chair;with call bell/phone within reach;with chair alarm set;with family/visitor present Nurse Communication: Mobility status PT Visit Diagnosis: Difficulty in walking, not elsewhere classified (R26.2)    Time: 8546-2703 PT Time Calculation (min) (ACUTE ONLY): 26 min   Charges:   PT Evaluation $PT Eval Low Complexity: 1 Low PT Treatments $Gait Training: 8-22 mins   PT G Codes:        Pg [phone]   Cathaleen Korol 01/30/2017, 5:00 PM

## 2017-01-30 NOTE — Transfer of Care (Signed)
Immediate Anesthesia Transfer of Care Note  Patient: Debbie Richards  Procedure(s) Performed: Procedure(s) with comments: RIGHT TOTAL KNEE ARTHROPLASTY (Right) - With adductor canal block  Patient Location: PACU  Anesthesia Type:GA combined with regional for post-op pain  Level of Consciousness:  sedated, patient cooperative and responds to stimulation  Airway & Oxygen Therapy:Patient Spontanous Breathing and Patient connected to face mask oxgen  Post-op Assessment:  Report given to PACU RN and Post -op Vital signs reviewed and stable  Post vital signs:  Reviewed and stable  Last Vitals:  Vitals:   01/30/17 0544  BP: (!) 102/54  Pulse: 80  Resp: 18  Temp: 36.9 C  SpO2: 09%    Complications: No apparent anesthesia complications

## 2017-01-30 NOTE — Clinical Social Work Placement (Addendum)
   CLINICAL SOCIAL WORK PLACEMENT  NOTE  Date:  01/30/2017  Patient Details  Name: SELDA JALBERT MRN: [id number] Date of Birth: 1930/12/13  Clinical Social Work is seeking post-discharge placement for this patient at the [place] level of care (*CSW will initial, date and re-position this form in  chart as items are completed):      Patient/family provided with [facility] Work Department's list of facilities offering this level of care within the geographic area requested by the patient (or if unable, by the patient's family).  Yes   Patient/family informed of their freedom to choose among providers that offer the needed level of care, that participate in Medicare, Medicaid or managed care program needed by the patient, have an available bed and are willing to accept the patient.  Yes   Patient/family informed of [facility]'s ownership interest in [facility] and [facility], as well as of the fact that they are under no obligation to receive care at these facilities.  PASRR submitted to EDS on 01/30/17     PASRR number received on 01/30/17     Existing PASRR number confirmed on       FL2 transmitted to all facilities in geographic area requested by pt/family on       FL2 transmitted to all facilities within larger geographic area on       Patient informed that his/her managed care company has contracts with or will negotiate with certain facilities, including the following:        Yes   Patient/family informed of bed offers received.  Patient chooses bed at [facility] [place] recommends and patient chooses bed at    Patient to be transferred to   on  . 02/02/2017  Patient to be transferred to facility by   [facility]    Patient family notified on   of transfer. 02/02/2017  Name of family member notified:    Patient called daughter at bedside  PHYSICIAN       Additional Comment:     _______________________________________________ [name], [place] 01/30/2017, 1:58 PM

## 2017-01-30 NOTE — Progress Notes (Signed)
ANTICOAGULATION CONSULT NOTE - Initial Consult  Pharmacy Consult for xarelto Indication: Hx of chronic DVT with PE. Has been on coumadin. PCP wants to switch to oral long term treatment.  No Known Allergies  Patient Measurements: Height: 5\' 10"  (177.8 cm) Weight: 245 lb (111.1 kg) IBW/kg (Calculated) : 68.5  Vital Signs: Temp: 97.5 F (36.4 C) (08/24 1109) Temp Source: Oral (08/24 0544) BP: 134/60 (08/24 1109) Pulse Rate: 76 (08/24 1109)  Labs: No results for input(s): HGB, HCT, PLT, APTT, LABPROT, INR, HEPARINUNFRC, HEPRLOWMOCWT, CREATININE, CKTOTAL, CKMB, TROPONINI in the last 72 hours.  Estimated Creatinine Clearance: 43.1 mL/min (A) (by C-G formula based on SCr of 1.31 mg/dL (H)).   Medical History: Past Medical History:  Diagnosis Date  . Borderline diabetes   . Chronic lower back pain   . Clotting disorder ([place])   . DVT, lower extremity ([place]) 01/2002; 06/2003   RLE; LLE  . Enlarged thyroid gland   . 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 Fibromyalgia   . Gallstones   . GERD (gastroesophageal reflux disease)   . H/O hiatal hernia   . History of stomach ulcers   . Hyperlipidemia   . Hypertension   . Hypothyroidism   . Migraine    "I've had 4; last time was when I was in my ?30's"  . Osteoarthritis   . Phlebitis    LLE  . Pneumonia   . Pulmonary embolism ([place]) 2003  . Shortness of breath on exertion     Medications:  Prescriptions Prior to Admission  Medication Sig Dispense Refill Last Dose  . atenolol (TENORMIN) 25 MG tablet Take 25 mg by mouth daily.   01/30/2017 at 0415  . colchicine 0.6 MG tablet Take 0.6 mg by mouth daily.   01/29/2017 at Unknown time  . furosemide (LASIX) 20 MG tablet Take 20 mg by mouth daily.    Past Week at Unknown time  . gabapentin (NEURONTIN) 300 MG capsule Take 600 mg by mouth at bedtime.    01/29/2017 at Unknown time  . levothyroxine (SYNTHROID, LEVOTHROID) 75 MCG tablet Take 75 mcg by mouth daily before breakfast.    01/30/2017  at 0415  . lovastatin (MEVACOR) 20 MG tablet Take 1 tablet by mouth every other day.   Past Week at Unknown time  . OMEGA-3 FATTY ACIDS PO Take 2 capsules by mouth daily.    01/24/2017  . omeprazole (PRILOSEC) 20 MG capsule Take 20 mg by mouth daily as needed (indigestion).    01/29/2017 at Unknown time  . Vitamin D, Ergocalciferol, (DRISDOL) 50000 units CAPS capsule Take 50,000 Units by mouth every 7 (seven) days. Friday   01/23/2017  . warfarin (COUMADIN) 1 MG tablet Take 2 tablets (2 mg total) by mouth one time only at 6 PM. You need INR check tomorrow 8-07, and coumadin dose need to be adjusted as needed. (Patient taking differently: Take 2 mg by mouth one time only at 6 PM. You need INR check tomorrow 8-07, and coumadin dose need to be adjusted as needed. Takes 5 mg tablet along with 2 1 mg tablets to equal 7 mg daily except on Saturdays and takes 5 mg plus 3  1 mg tablets to equal 8 mg) 30 tablet 0   . warfarin (COUMADIN) 5 MG tablet Take 5 mg by mouth daily at 6 PM. Takes 5 mg tablet along with 2 1 mg tablets to equal 7 mg daily except on Saturdays and takes 5 mg plus 3  1  mg tablets to equal 8 mg   01/24/2017  . acetaminophen (TYLENOL) 650 MG CR tablet Take 1,300 mg by mouth every 8 (eight) hours as needed for pain.   More than a month at Unknown time  . amoxicillin-clavulanate (AUGMENTIN) 875-125 MG tablet Take 1 tablet by mouth 2 (two) times daily. (Patient not taking: Reported on 01/21/2017) 14 tablet 0 Not Taking at Unknown time    Assessment: 83 yo F with PMH of DVT in 2003, 2005 and PE 2003 on coumadin PTA.  S/p R TKR today. Hx of chronic DVT with PE. Has been on coumadin. PCP wants to switch to oral long term treatment. Wt 111 kg, Scr 1.31. 8/17 INR 2.06. Hg 14.5, pltc low at 145 (were low at 134 01/12/2016).  Anesthesia end time 0931 am.    Goal of Therapy:  VTE prevention Monitor platelets by anticoagulation protocol: Yes   Plan:  Start Xarelto 20 mg qsupper - d/w ortho PA, OK to start  today.  30 day free card to be given to pt Will educate patient and family  Eudelia Bunch, Pharm.D. [phone] 01/30/2017 1:00 PM   Leodis Sias T 01/30/2017,11:48 AM

## 2017-01-30 NOTE — Progress Notes (Signed)
Orthopedic Tech Progress Note Patient Details:  Debbie Richards 05-26-1931 [id number]  Put Pt in CPM at 1145. CPM Right Knee CPM Right Knee: On Right Knee Flexion (Degrees): 70 Right Knee Extension (Degrees): 0 Additional Comments: 4-6 hours per day, increase 10 degrees per day.   Charlott Rakes 01/30/2017, 11:50 AM

## 2017-01-30 NOTE — Anesthesia Procedure Notes (Addendum)
Anesthesia Regional Block: Adductor canal block   Pre-Anesthetic Checklist: ,, timeout performed, Correct Patient, Correct Site, Correct Laterality, Correct Procedure, Correct Position, site marked, Risks and benefits discussed,  Surgical consent,  Pre-op evaluation,  At surgeon's request and post-op pain management  Laterality: Right and Upper     Needles:  Injection technique: Single-shot  Needle Type: Echogenic Stimulator Needle     Needle Length: 10cm  Needle Gauge: 21   Needle insertion depth: 4 cm   Additional Needles:   Procedures: ultrasound guided,,,,,,,,  Narrative:  Start time: 01/30/2017 7:12 AM End time: 01/30/2017 7:22 AM Injection made incrementally with aspirations every 5 mL.  Performed by: Personally  Anesthesiologist: Lyn Hollingshead

## 2017-01-30 NOTE — Clinical Social Work Note (Signed)
Clinical Social Work Assessment  Patient Details  Name: Debbie Richards MRN: [id number] Date of Birth: Jun 01, 1931  Date of referral:  01/30/17               Reason for consult:  Facility Placement, Discharge Planning                Permission sought to share information with:  Chartered certified accountant granted to share information::  Yes, Verbal Permission Granted  Name::        Agency::     Relationship::     Contact Information:     Housing/Transportation Living arrangements for the past 2 months:  Single Family Home Source of Information:  Patient, Adult Children Patient Interpreter Needed:  None Criminal Activity/Legal Involvement Pertinent to Current Situation/Hospitalization:  No - Comment as needed Significant Relationships:  Adult Children Lives with:  Self Do you feel safe going back to the place where you live?   (SNF recommended.) Need for family participation in patient care:  Yes (Comment)  Care giving concerns:  Pt needs more care than is available at home following hospital dc.   Social Worker assessment / plan:  Pt hospitalized from home on 01/30/17 for pre planned right total knee arthroplasty. CSW met with pt / family to assist with dc planning. Pt has made prior arrangements to have [facility] rehab at [place] [facility] [place] [facility] [place]. Clinicals have been sent to SNF. CSW has confirmed dc plan with SNF / RNCM, Renee. CPM machine will be delivered to SNF on Monday. Pt is planning to dc to SNF on Sunday. Weekend CSW will assist with dc planning needs.  Employment status:  Retired Forensic scientist:  Medicare PT Recommendations:    Information / Referral to community resources:  [place]  Patient/Family's Response to care:  Pt / family are in agreement with plan for SNF.  Patient/Family's Understanding of and Emotional Response to Diagnosis, Current Treatment, and Prognosis:  Pt / family are aware of pt's medical status. " I'm glad surgery  is over." Pt is motivated to work with therapy and is looking forward to having rehab at [facility].  Emotional Assessment Appearance:  Appears stated age Attitude/Demeanor/Rapport:  Other (cooperative) Affect (typically observed):  Appropriate, Pleasant Orientation:  Oriented to Self, Oriented to Place, Oriented to  Time, Oriented to Situation Alcohol / Substance use:  Not Applicable Psych involvement (Current and /or in the community):  No (Comment)  Discharge Needs  Concerns to be addressed:  Discharge Planning Concerns Readmission within the last 30 days:  No Current discharge risk:  None Barriers to Discharge:  No Barriers Identified   Debbie Richards, [place] 01/30/2017, 1:45 PM

## 2017-01-31 LAB — BASIC METABOLIC PANEL
Anion gap: 5 (ref 5–15)
BUN: 12 mg/dL (ref 6–20)
CO2: 26 mmol/L (ref 22–32)
Calcium: 8.8 mg/dL — ABNORMAL LOW (ref 8.9–10.3)
Chloride: 105 mmol/L (ref 101–111)
Creatinine, Ser: 0.77 mg/dL (ref 0.44–1.00)
GFR calc Af Amer: >60 mL/min (ref >60)
GFR calc non Af Amer: >60 mL/min (ref >60)
Glucose, Bld: 194 mg/dL — ABNORMAL HIGH (ref 65–99)
Potassium: 4.3 mmol/L (ref 3.5–5.1)
Sodium: 136 mmol/L (ref 135–145)

## 2017-01-31 LAB — CBC
HCT: 37.5 % (ref 36.0–46.0)
Hemoglobin: 12.3 g/dL (ref 12.0–15.0)
MCH: 30.9 pg (ref 26.0–34.0)
MCHC: 32.8 g/dL (ref 30.0–36.0)
MCV: 94.2 fL (ref 78.0–100.0)
Platelets: 122 10*3/uL — ABNORMAL LOW (ref 150–400)
RBC: 3.98 MIL/uL (ref 3.87–5.11)
RDW: 12.8 % (ref 11.5–15.5)
WBC: 12.4 10*3/uL — ABNORMAL HIGH (ref 4.0–10.5)

## 2017-01-31 MED ORDER — RIVAROXABAN 20 MG PO TABS: 20.0000 mg | ORAL_TABLET | Freq: Every day | ORAL | 0 refills | Status: DC

## 2017-01-31 MED ORDER — OXYCODONE HCL 5 MG PO TABS: 5.0000 mg | ORAL_TABLET | ORAL | 0 refills | Status: DC | PRN

## 2017-01-31 MED ORDER — METHOCARBAMOL 500 MG PO TABS: 500.0000 mg | ORAL_TABLET | Freq: Four times a day (QID) | ORAL | 0 refills | Status: DC | PRN

## 2017-01-31 NOTE — Progress Notes (Signed)
    Patient doing well PO Day 1, px well controlled and minimal 3/5. Good progress in PT/OT. Case management has seen and likely D/C SNF Monday pending bed availability. Yes passing gas, denies N/D/V, eating and drinking NL.    Physical Exam: Vitals:   01/31/17 0103 01/31/17 0510  BP: (!) 130/51 (!) 129/50  Pulse: 78 73  Resp: 18 18  Temp: 97.8 F (36.6 C) 98 F (36.7 C)  SpO2: 100% 100%   CBC Latest Ref Rng & Units 01/31/2017 01/23/2017 01/13/2016  WBC 4.0 - 10.5 K/uL 12.4(H) 7.8 4.0  Hemoglobin 12.0 - 15.0 g/dL 12.3 14.5 12.7  Hematocrit 36.0 - 46.0 % 37.5 43.7 39.8  Platelets 150 - 400 K/uL 122(L) 145(L) 142(L)   BMP Latest Ref Rng & Units 01/31/2017 01/23/2017 01/13/2016  Glucose 65 - 99 mg/dL 194(H) 121(H) 114(H)  BUN 6 - 20 mg/dL 12 27(H) 5(L)  Creatinine 0.44 - 1.00 mg/dL 0.77 1.31(H) 0.77  Sodium 135 - 145 mmol/L 136 145 142  Potassium 3.5 - 5.1 mmol/L 4.3 5.5(H) 4.1  Chloride 101 - 111 mmol/L 105 110 108  CO2 22 - 32 mmol/L 26 26 28   Calcium 8.9 - 10.3 mg/dL 8.8(L) 9.8 8.8(L)   Dressing in place, CDI, Knee immobilizer in place. Pt in bed working with PT comfortably. Distal compartments soft, 2+ DPP = BIL NVI  POD #1 s/p R TKA per Dr Berenice Primas doing well  - up with PT/OT, encourage ambulation  -Knee immobilizer   -CPM - Cont current px regimen, well controlled - Xarelto 20mg  cont upon D/C to SNF for DVT prophylaxis  - likely d/c SNF Sunday vs Monday pending bed availability

## 2017-01-31 NOTE — Progress Notes (Signed)
Physical Therapy Treatment Patient Details Name: Debbie Richards MRN: [id number] DOB: [birth year] Today's Date: 01/31/2017    History of Present Illness Pt s/p R TKR and with hx of nueropathy bilat feet and Fibromyalgia    PT Comments    Pt motivated and progressing steadily with mobility.   Follow Up Recommendations  SNF     Equipment Recommendations  None recommended by PT    Recommendations for Other Services OT consult     Precautions / Restrictions Precautions Precautions: Fall;Knee Required Braces or Orthoses: Knee Immobilizer - Right Knee Immobilizer - Right: Discontinue once straight leg raise with < 10 degree lag Restrictions Weight Bearing Restrictions: No Other Position/Activity Restrictions: WBAT    Mobility  Bed Mobility Overal bed mobility: Needs Assistance Bed Mobility: Supine to Sit     Supine to sit: Min assist;Mod assist     General bed mobility comments: Increased time with cues for sequence and use of L LE to self assist.  Physical assist for LE management and to bring trunk to upright position  Transfers Overall transfer level: Needs assistance Equipment used: Rolling walker (2 wheeled) Transfers: Sit to/from Stand Sit to Stand: Min assist;Mod assist;+2 physical assistance;+2 safety/equipment;From elevated surface         General transfer comment: cues for LE management and use of UEs to self assist  Ambulation/Gait Ambulation/Gait assistance: Min assist;+2 physical assistance;+2 safety/equipment Ambulation Distance (Feet): 47 Feet Assistive device: Rolling walker (2 wheeled) Gait Pattern/deviations: Decreased step length - right;Decreased step length - left;Shuffle;Trunk flexed Gait velocity: decr Gait velocity interpretation: Below normal speed for age/gender General Gait Details: cues for posture, position from RW and initial sequence   Stairs            Wheelchair Mobility    Modified Rankin (Stroke Patients Only)        Balance Overall balance assessment: Needs assistance Sitting-balance support: No upper extremity supported;Feet supported Sitting balance-Leahy Scale: Good     Standing balance support: Bilateral upper extremity supported Standing balance-Leahy Scale: Poor                              Cognition Arousal/Alertness: Awake/alert Behavior During Therapy: WFL for tasks assessed/performed Overall Cognitive Status: Within Functional Limits for tasks assessed                                        Exercises Total Joint Exercises Ankle Circles/Pumps: AROM;Both;20 reps;Supine Quad Sets: AROM;Both;10 reps;Supine Heel Slides: AAROM;Right;15 reps;Supine Straight Leg Raises: AAROM;Right;10 reps;Supine Goniometric ROM: AAROM R knee -10 - 40    General Comments        Pertinent Vitals/Pain Pain Assessment: 0-10 Pain Score: 4  Pain Location: R knee Pain Descriptors / Indicators: Aching;Sore Pain Intervention(s): Limited activity within patient's tolerance;Monitored during session;Premedicated before session;Ice applied    Home Living                      Prior Function            PT Goals (current goals can now be found in the care plan section) Acute Rehab PT Goals Patient Stated Goal: Rehab and then home with ltd assist PT Goal Formulation: With patient Time For Goal Achievement: 02/06/17 Potential to Achieve Goals: Good Progress towards PT goals: Progressing toward goals    Frequency  7X/week      PT Plan Current plan remains appropriate    Co-evaluation              AM-PAC PT "6 Clicks" Daily Activity  Outcome Measure  Difficulty turning over in bed (including adjusting bedclothes, sheets and blankets)?: Unable Difficulty moving from lying on back to sitting on the side of the bed? : Unable Difficulty sitting down on and standing up from a chair with arms (e.g., wheelchair, bedside commode, etc,.)?: Unable Help  needed moving to and from a bed to chair (including a wheelchair)?: A Lot Help needed walking in hospital room?: A Little Help needed climbing 3-5 steps with a railing? : A Lot 6 Click Score: 10    End of Session Equipment Utilized During Treatment: Gait belt;Right knee immobilizer Activity Tolerance: Patient tolerated treatment well Patient left: in chair;with call bell/phone within reach Nurse Communication: Mobility status PT Visit Diagnosis: Difficulty in walking, not elsewhere classified (R26.2)     Time: 4166-0630 PT Time Calculation (min) (ACUTE ONLY): 23 min  Charges:  $Gait Training: 23-37 mins $Therapeutic Exercise: 8-22 mins                    G Codes:       Pg [phone]    Vedanshi Massaro 01/31/2017, 12:05 PM

## 2017-01-31 NOTE — Progress Notes (Signed)
Physical Therapy Treatment Patient Details Name: SHAWNI VOLKOV MRN: [id number] DOB: 30-Nov-1930 Today's Date: 01/31/2017    History of Present Illness Pt s/p R TKR and with hx of nueropathy bilat feet and Fibromyalgia    PT Comments    Therex program performed.  OOB postponed to allow rest break at pt request.   Follow Up Recommendations  SNF     Equipment Recommendations  None recommended by PT    Recommendations for Other Services OT consult     Precautions / Restrictions Precautions Precautions: Fall;Knee Required Braces or Orthoses: Knee Immobilizer - Right Knee Immobilizer - Right: Discontinue once straight leg raise with < 10 degree lag Restrictions Weight Bearing Restrictions: No Other Position/Activity Restrictions: WBAT    Mobility  Bed Mobility                  Transfers                    Ambulation/Gait                 Stairs            Wheelchair Mobility    Modified Rankin (Stroke Patients Only)       Balance                                            Cognition Arousal/Alertness: Awake/alert Behavior During Therapy: WFL for tasks assessed/performed Overall Cognitive Status: Within Functional Limits for tasks assessed                                        Exercises Total Joint Exercises Ankle Circles/Pumps: AROM;Both;20 reps;Supine Quad Sets: AROM;Both;10 reps;Supine Heel Slides: AAROM;Right;15 reps;Supine Straight Leg Raises: AAROM;Right;10 reps;Supine Goniometric ROM: AAROM R knee -10 - 40    General Comments        Pertinent Vitals/Pain Pain Assessment: 0-10 Pain Score: 6  Pain Location: R knee Pain Descriptors / Indicators: Aching;Sore Pain Intervention(s): Limited activity within patient's tolerance;Monitored during session;Premedicated before session;Ice applied    Home Living                      Prior Function            PT Goals (current  goals can now be found in the care plan section) Acute Rehab PT Goals Patient Stated Goal: Rehab and then home with ltd assist PT Goal Formulation: With patient Time For Goal Achievement: 02/06/17 Potential to Achieve Goals: Good Progress towards PT goals: Progressing toward goals    Frequency    7X/week      PT Plan Current plan remains appropriate    Co-evaluation              AM-PAC PT "6 Clicks" Daily Activity  Outcome Measure  Difficulty turning over in bed (including adjusting bedclothes, sheets and blankets)?: Unable Difficulty moving from lying on back to sitting on the side of the bed? : Unable Difficulty sitting down on and standing up from a chair with arms (e.g., wheelchair, bedside commode, etc,.)?: Unable Help needed moving to and from a bed to chair (including a wheelchair)?: A Lot Help needed walking in hospital room?: A Little Help needed climbing 3-5 steps with a railing? :  A Lot 6 Click Score: 10    End of Session   Activity Tolerance: Patient tolerated treatment well Patient left: in bed;with call bell/phone within reach;with chair alarm set Nurse Communication: Mobility status PT Visit Diagnosis: Difficulty in walking, not elsewhere classified (R26.2)     Time: 4967-5916 PT Time Calculation (min) (ACUTE ONLY): 21 min  Charges:  $Therapeutic Exercise: 8-22 mins                    G Codes:       Pg [phone]    Jehad Bisono 01/31/2017, 11:59 AM

## 2017-01-31 NOTE — Progress Notes (Signed)
Physical Therapy Treatment Patient Details Name: Debbie Richards MRN: [id number] DOB: [birth year] Today's Date: 01/31/2017    History of Present Illness Pt s/p R TKR and with hx of nueropathy bilat feet and Fibromyalgia    PT Comments    Steady progress with mobility.  Pt eager for progression to rehab setting.   Follow Up Recommendations  SNF     Equipment Recommendations  None recommended by PT    Recommendations for Other Services OT consult     Precautions / Restrictions Precautions Precautions: Fall;Knee Required Braces or Orthoses: Knee Immobilizer - Right Knee Immobilizer - Right: Discontinue once straight leg raise with < 10 degree lag Restrictions Weight Bearing Restrictions: No Other Position/Activity Restrictions: WBAT    Mobility  Bed Mobility Overal bed mobility: Needs Assistance Bed Mobility: Sit to Supine     Supine to sit: Min assist;Mod assist Sit to supine: Min assist;Mod assist   General bed mobility comments: Increased time with cues for sequence and use of L LE to self assist.  Physical assist for LE management and to bring trunk to upright position  Transfers Overall transfer level: Needs assistance Equipment used: Rolling walker (2 wheeled) Transfers: Sit to/from Stand Sit to Stand: Min assist;Mod assist;From elevated surface         General transfer comment: cues for LE management and use of UEs to self assist  Ambulation/Gait Ambulation/Gait assistance: Min assist Ambulation Distance (Feet): 86 Feet Assistive device: Rolling walker (2 wheeled) Gait Pattern/deviations: Decreased step length - right;Decreased step length - left;Shuffle;Trunk flexed Gait velocity: decr Gait velocity interpretation: Below normal speed for age/gender General Gait Details: cues for posture, position from RW and initial sequence   Stairs            Wheelchair Mobility    Modified Rankin (Stroke Patients Only)       Balance Overall balance  assessment: Needs assistance Sitting-balance support: No upper extremity supported;Feet supported Sitting balance-Leahy Scale: Good     Standing balance support: Bilateral upper extremity supported Standing balance-Leahy Scale: Poor                              Cognition Arousal/Alertness: Awake/alert Behavior During Therapy: WFL for tasks assessed/performed Overall Cognitive Status: Within Functional Limits for tasks assessed                                        Exercises Total Joint Exercises Ankle Circles/Pumps: AROM;Both;20 reps;Supine Quad Sets: AROM;Both;10 reps;Supine Heel Slides: AAROM;Right;15 reps;Supine Straight Leg Raises: AAROM;Right;10 reps;Supine Goniometric ROM: AAROM R knee -10 - 40    General Comments        Pertinent Vitals/Pain Pain Assessment: 0-10 Pain Score: 3  Pain Location: R knee Pain Descriptors / Indicators: Aching;Sore Pain Intervention(s): Limited activity within patient's tolerance;Monitored during session;Premedicated before session;Ice applied    Home Living                      Prior Function            PT Goals (current goals can now be found in the care plan section) Acute Rehab PT Goals Patient Stated Goal: Rehab and then home with ltd assist PT Goal Formulation: With patient Time For Goal Achievement: 02/06/17 Potential to Achieve Goals: Good Progress towards PT goals: Progressing toward goals  Frequency    7X/week      PT Plan Current plan remains appropriate    Co-evaluation              AM-PAC PT "6 Clicks" Daily Activity  Outcome Measure  Difficulty turning over in bed (including adjusting bedclothes, sheets and blankets)?: Unable Difficulty moving from lying on back to sitting on the side of the bed? : Unable Difficulty sitting down on and standing up from a chair with arms (e.g., wheelchair, bedside commode, etc,.)?: Unable Help needed moving to and from a bed  to chair (including a wheelchair)?: A Lot Help needed walking in hospital room?: A Little Help needed climbing 3-5 steps with a railing? : A Lot 6 Click Score: 10    End of Session Equipment Utilized During Treatment: Gait belt;Right knee immobilizer Activity Tolerance: Patient tolerated treatment well Patient left: with call bell/phone within reach;in bed;with family/visitor present Nurse Communication: Mobility status PT Visit Diagnosis: Difficulty in walking, not elsewhere classified (R26.2)     Time: 2876-8115 PT Time Calculation (min) (ACUTE ONLY): 25 min  Charges:  $Gait Training: 23-37 mins $Therapeutic Exercise: 8-22 mins                    G Codes:       Pg [phone]    Gabrial Domine 01/31/2017, 2:25 PM

## 2017-02-01 LAB — CBC
HCT: 37.9 % (ref 36.0–46.0)
Hemoglobin: 12.4 g/dL (ref 12.0–15.0)
MCH: 30.9 pg (ref 26.0–34.0)
MCHC: 32.7 g/dL (ref 30.0–36.0)
MCV: 94.5 fL (ref 78.0–100.0)
Platelets: 137 10*3/uL — ABNORMAL LOW (ref 150–400)
RBC: 4.01 MIL/uL (ref 3.87–5.11)
RDW: 13.2 % (ref 11.5–15.5)
WBC: 13.6 10*3/uL — ABNORMAL HIGH (ref 4.0–10.5)

## 2017-02-01 NOTE — Progress Notes (Signed)
    Patient doing well Minimal R knee pain Narcotics made her feel very sedated and disoriented yesterday, so she's been taking tylenol for pain   Physical Exam: Vitals:   02/01/17 0421 02/01/17 0455  BP: (!) 115/59 (!) 130/59  Pulse: 61 63  Resp: 16 16  Temp: 98.8 F (37.1 C) 97.6 F (36.4 C)  SpO2: 93% 98%    Dressing in place R LE in CPM NVI Patient appears very comfortable and very pleasant  Hg: 12.4  POD #2 s/p R TKA doing well  - up with PT/OT, encourage ambulation, cont CPM - Tylenol for pain - d/c to SNF once bed available, likely tomorrow

## 2017-02-01 NOTE — Progress Notes (Signed)
Physical Therapy Treatment Patient Details Name: Debbie Richards MRN: [id number] DOB: July 24, 1930 Today's Date: 02/01/2017    History of Present Illness Pt s/p R TKR and with hx of nueropathy bilat feet and Fibromyalgia    PT Comments    Pt continues cooperative and progressing steadily with mobility.   Follow Up Recommendations  SNF     Equipment Recommendations  None recommended by PT    Recommendations for Other Services OT consult     Precautions / Restrictions Precautions Precautions: Fall;Knee Required Braces or Orthoses: Knee Immobilizer - Right Knee Immobilizer - Right: Discontinue once straight leg raise with < 10 degree lag Restrictions Weight Bearing Restrictions: No Other Position/Activity Restrictions: WBAT    Mobility  Bed Mobility Overal bed mobility: Needs Assistance Bed Mobility: Supine to Sit     Supine to sit: Min assist     General bed mobility comments: Increased time with cues for sequence and use of L LE to self assist.  Physical assist for LE management and to bring trunk to upright position  Transfers Overall transfer level: Needs assistance Equipment used: Rolling walker (2 wheeled) Transfers: Sit to/from Stand Sit to Stand: Min assist;From elevated surface         General transfer comment: cues for LE management and use of UEs to self assist  Ambulation/Gait Ambulation/Gait assistance: Min assist Ambulation Distance (Feet): 80 Feet Assistive device: Rolling walker (2 wheeled) Gait Pattern/deviations: Decreased step length - right;Decreased step length - left;Shuffle;Trunk flexed Gait velocity: decr Gait velocity interpretation: Below normal speed for age/gender General Gait Details: cues for posture, position from RW and initial sequence   Stairs            Wheelchair Mobility    Modified Rankin (Stroke Patients Only)       Balance Overall balance assessment: Needs assistance Sitting-balance support: No upper  extremity supported;Feet supported Sitting balance-Leahy Scale: Good     Standing balance support: Bilateral upper extremity supported Standing balance-Leahy Scale: Poor                              Cognition Arousal/Alertness: Awake/alert Behavior During Therapy: WFL for tasks assessed/performed Overall Cognitive Status: Within Functional Limits for tasks assessed                                        Exercises Total Joint Exercises Ankle Circles/Pumps: AROM;Both;20 reps;Supine Quad Sets: AROM;Both;Supine;15 reps Heel Slides: AAROM;Right;Supine;15 reps Straight Leg Raises: AAROM;Right;Supine;20 reps Goniometric ROM: R TKR AAROM -10 - 50    General Comments        Pertinent Vitals/Pain Pain Assessment: 0-10 Pain Score: 5  Pain Location: R knee Pain Descriptors / Indicators: Aching;Sore Pain Intervention(s): Limited activity within patient's tolerance;Monitored during session;Premedicated before session;Ice applied    Home Living                      Prior Function            PT Goals (current goals can now be found in the care plan section) Acute Rehab PT Goals Patient Stated Goal: Rehab and then home with ltd assist PT Goal Formulation: With patient Time For Goal Achievement: 02/06/17 Potential to Achieve Goals: Good Progress towards PT goals: Progressing toward goals    Frequency    7X/week  PT Plan Current plan remains appropriate    Co-evaluation              AM-PAC PT "6 Clicks" Daily Activity  Outcome Measure  Difficulty turning over in bed (including adjusting bedclothes, sheets and blankets)?: Unable Difficulty moving from lying on back to sitting on the side of the bed? : Unable Difficulty sitting down on and standing up from a chair with arms (e.g., wheelchair, bedside commode, etc,.)?: Unable Help needed moving to and from a bed to chair (including a wheelchair)?: A Little Help needed walking  in hospital room?: A Little Help needed climbing 3-5 steps with a railing? : A Lot 6 Click Score: 11    End of Session Equipment Utilized During Treatment: Gait belt;Right knee immobilizer Activity Tolerance: Patient tolerated treatment well Patient left: with call bell/phone within reach;with family/visitor present;in chair Nurse Communication: Mobility status PT Visit Diagnosis: Difficulty in walking, not elsewhere classified (R26.2)     Time: 3005-1102 PT Time Calculation (min) (ACUTE ONLY): 38 min  Charges:  $Gait Training: 8-22 mins $Therapeutic Exercise: 8-22 mins                    G Codes:       Pg [phone]    Kemper Hochman 02/01/2017, 11:40 AM

## 2017-02-01 NOTE — Progress Notes (Signed)
Physical Therapy Treatment Patient Details Name: Debbie Richards MRN: [id number] DOB: Jul 08, 1930 Today's Date: 02/01/2017    History of Present Illness Pt s/p R TKR and with hx of nueropathy bilat feet and Fibromyalgia    PT Comments    Pt continues motivated and progressing steadily with mobility.  Pt eager for progression to rehab setting.   Follow Up Recommendations  SNF     Equipment Recommendations  None recommended by PT    Recommendations for Other Services OT consult     Precautions / Restrictions Precautions Precautions: Fall;Knee Required Braces or Orthoses: Knee Immobilizer - Right Knee Immobilizer - Right: Discontinue once straight leg raise with < 10 degree lag Restrictions Weight Bearing Restrictions: No Other Position/Activity Restrictions: WBAT    Mobility  Bed Mobility Overal bed mobility: Needs Assistance Bed Mobility: Sit to Supine     Supine to sit: Min assist Sit to supine: Min assist   General bed mobility comments: Increased time with cues for sequence and use of L LE to self assist.  Physical assist for LE management and to bring trunk to upright position  Transfers Overall transfer level: Needs assistance Equipment used: Rolling walker (2 wheeled) Transfers: Sit to/from Stand Sit to Stand: Min assist;From elevated surface         General transfer comment: cues for LE management and use of UEs to self assist  Ambulation/Gait Ambulation/Gait assistance: Min assist Ambulation Distance (Feet): 75 Feet (twice) Assistive device: Rolling walker (2 wheeled) Gait Pattern/deviations: Decreased step length - right;Decreased step length - left;Shuffle;Trunk flexed Gait velocity: decr Gait velocity interpretation: Below normal speed for age/gender General Gait Details: cues for posture, position from RW and initial sequence   Stairs            Wheelchair Mobility    Modified Rankin (Stroke Patients Only)       Balance Overall  balance assessment: Needs assistance Sitting-balance support: No upper extremity supported;Feet supported Sitting balance-Leahy Scale: Good     Standing balance support: Bilateral upper extremity supported Standing balance-Leahy Scale: Poor                              Cognition Arousal/Alertness: Awake/alert Behavior During Therapy: WFL for tasks assessed/performed Overall Cognitive Status: Within Functional Limits for tasks assessed                                        Exercises Total Joint Exercises Ankle Circles/Pumps: AROM;Both;20 reps;Supine Quad Sets: AROM;Both;Supine;15 reps Heel Slides: AAROM;Right;Supine;15 reps Straight Leg Raises: AAROM;Right;Supine;20 reps Goniometric ROM: R TKR AAROM -10 - 50    General Comments        Pertinent Vitals/Pain Pain Assessment: 0-10 Pain Score: 4  Pain Location: R knee Pain Descriptors / Indicators: Aching;Sore Pain Intervention(s): Limited activity within patient's tolerance;Premedicated before session;Monitored during session;Ice applied    Home Living                      Prior Function            PT Goals (current goals can now be found in the care plan section) Acute Rehab PT Goals Patient Stated Goal: Rehab and then home with ltd assist PT Goal Formulation: With patient Time For Goal Achievement: 02/06/17 Potential to Achieve Goals: Good Progress towards PT goals: Progressing toward goals  Frequency    7X/week      PT Plan Current plan remains appropriate    Co-evaluation              AM-PAC PT "6 Clicks" Daily Activity  Outcome Measure  Difficulty turning over in bed (including adjusting bedclothes, sheets and blankets)?: Unable Difficulty moving from lying on back to sitting on the side of the bed? : Unable Difficulty sitting down on and standing up from a chair with arms (e.g., wheelchair, bedside commode, etc,.)?: Unable Help needed moving to and from  a bed to chair (including a wheelchair)?: A Little Help needed walking in hospital room?: A Little Help needed climbing 3-5 steps with a railing? : A Lot 6 Click Score: 11    End of Session Equipment Utilized During Treatment: Gait belt;Right knee immobilizer Activity Tolerance: Patient tolerated treatment well Patient left: with call bell/phone within reach;with family/visitor present;in chair Nurse Communication: Mobility status PT Visit Diagnosis: Difficulty in walking, not elsewhere classified (R26.2)     Time: 1448-1856 PT Time Calculation (min) (ACUTE ONLY): 23 min  Charges:  $Gait Training: 23-37 mins $Therapeutic Exercise: 8-22 mins                    G Codes:       Pg [phone]    Tanai Bouler 02/01/2017, 2:08 PM

## 2017-02-02 DIAGNOSIS — Z471 Aftercare following joint replacement surgery: Secondary | ICD-10-CM | POA: Diagnosis not present

## 2017-02-02 DIAGNOSIS — G6189 Other inflammatory polyneuropathies: Secondary | ICD-10-CM | POA: Diagnosis not present

## 2017-02-02 DIAGNOSIS — Z86718 Personal history of other venous thrombosis and embolism: Secondary | ICD-10-CM | POA: Diagnosis not present

## 2017-02-02 DIAGNOSIS — E039 Hypothyroidism, unspecified: Secondary | ICD-10-CM | POA: Diagnosis not present

## 2017-02-02 DIAGNOSIS — G8911 Acute pain due to trauma: Secondary | ICD-10-CM | POA: Diagnosis not present

## 2017-02-02 DIAGNOSIS — M797 Fibromyalgia: Secondary | ICD-10-CM | POA: Diagnosis not present

## 2017-02-02 DIAGNOSIS — E049 Nontoxic goiter, unspecified: Secondary | ICD-10-CM | POA: Diagnosis not present

## 2017-02-02 DIAGNOSIS — Z86711 Personal history of pulmonary embolism: Secondary | ICD-10-CM | POA: Diagnosis not present

## 2017-02-02 DIAGNOSIS — G4700 Insomnia, unspecified: Secondary | ICD-10-CM | POA: Diagnosis not present

## 2017-02-02 DIAGNOSIS — K5792 Diverticulitis of intestine, part unspecified, without perforation or abscess without bleeding: Secondary | ICD-10-CM | POA: Diagnosis not present

## 2017-02-02 DIAGNOSIS — K5900 Constipation, unspecified: Secondary | ICD-10-CM | POA: Diagnosis not present

## 2017-02-02 DIAGNOSIS — Z87440 Personal history of urinary (tract) infections: Secondary | ICD-10-CM | POA: Diagnosis not present

## 2017-02-02 DIAGNOSIS — E785 Hyperlipidemia, unspecified: Secondary | ICD-10-CM | POA: Diagnosis not present

## 2017-02-02 DIAGNOSIS — Z7901 Long term (current) use of anticoagulants: Secondary | ICD-10-CM | POA: Diagnosis not present

## 2017-02-02 DIAGNOSIS — I999 Unspecified disorder of circulatory system: Secondary | ICD-10-CM | POA: Diagnosis not present

## 2017-02-02 DIAGNOSIS — Z96651 Presence of right artificial knee joint: Secondary | ICD-10-CM | POA: Diagnosis not present

## 2017-02-02 DIAGNOSIS — M1990 Unspecified osteoarthritis, unspecified site: Secondary | ICD-10-CM | POA: Diagnosis not present

## 2017-02-02 DIAGNOSIS — I10 Essential (primary) hypertension: Secondary | ICD-10-CM | POA: Diagnosis not present

## 2017-02-02 DIAGNOSIS — S8002XA Contusion of left knee, initial encounter: Secondary | ICD-10-CM | POA: Diagnosis not present

## 2017-02-02 DIAGNOSIS — M1711 Unilateral primary osteoarthritis, right knee: Secondary | ICD-10-CM | POA: Diagnosis not present

## 2017-02-02 LAB — CBC
HCT: 35.4 % — ABNORMAL LOW (ref 36.0–46.0)
Hemoglobin: 11.8 g/dL — ABNORMAL LOW (ref 12.0–15.0)
MCH: 31.9 pg (ref 26.0–34.0)
MCHC: 33.3 g/dL (ref 30.0–36.0)
MCV: 95.7 fL (ref 78.0–100.0)
Platelets: 123 10*3/uL — ABNORMAL LOW (ref 150–400)
RBC: 3.7 MIL/uL — ABNORMAL LOW (ref 3.87–5.11)
RDW: 13.4 % (ref 11.5–15.5)
WBC: 7.9 10*3/uL (ref 4.0–10.5)

## 2017-02-02 MED ORDER — RIVAROXABAN 20 MG PO TABS: 20.0000 mg | ORAL_TABLET | Freq: Every day | ORAL | 3 refills | Status: DC

## 2017-02-02 MED ORDER — OXYCODONE-ACETAMINOPHEN 5-325 MG PO TABS: 1.0000 | ORAL_TABLET | Freq: Four times a day (QID) | ORAL | 0 refills | Status: DC | PRN

## 2017-02-02 NOTE — Discharge Summary (Addendum)
Patient ID: Debbie Richards MRN: [id number] DOB/AGE: 07-Nov-1930 83 y.o.  Admit date: 01/30/2017 Discharge date: 02/02/2017  Admission Diagnoses:  Principal Problem:   Primary osteoarthritis of right knee Active Problems:   History of pulmonary embolus (PE)   Primary osteoarthritis of left knee   Discharge Diagnoses:  Same  Past Medical History:  Diagnosis Date  . Borderline diabetes   . Chronic lower back pain   . Clotting disorder ([place])   . DVT, lower extremity ([place]) 01/2002; 06/2003   RLE; LLE  . Enlarged thyroid gland   . 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 Fibromyalgia   . Gallstones   . GERD (gastroesophageal reflux disease)   . H/O hiatal hernia   . History of stomach ulcers   . Hyperlipidemia   . Hypertension   . Hypothyroidism   . Migraine    "I've had 4; last time was when I was in my ?30's"  . Osteoarthritis   . Phlebitis    LLE  . Pneumonia   . Pulmonary embolism ([place]) 2003  . Shortness of breath on exertion     Surgeries: Procedure(s): RIGHT TOTAL KNEE ARTHROPLASTY on 01/30/2017   Consultants:   Discharged Condition: Improved  Hospital Course: Debbie Richards is an 83 y.o. female who was admitted 01/30/2017 for operative treatment ofPrimary osteoarthritis of right knee. Patient has severe unremitting pain that affects sleep, daily activities, and work/hobbies. After pre-op clearance the patient was taken to the operating room on 01/30/2017 and underwent  Procedure(s): RIGHT TOTAL KNEE ARTHROPLASTY.    Patient was given perioperative antibiotics:  Anti-infectives    Start     Dose/Rate Route Frequency Ordered Stop   01/30/17 1330  ceFAZolin (ANCEF) IVPB 2g/100 mL premix     2 g 200 mL/hr over 30 Minutes Intravenous Every 6 hours 01/30/17 1118 01/30/17 1858   01/30/17 0638  ceFAZolin (ANCEF) 2-4 GM/100ML-% IVPB    Comments:  Key, Kristopher   : cabinet override      01/30/17 0638 01/30/17 0736   01/30/17 0556  ceFAZolin (ANCEF) IVPB 2g/100 mL  premix     2 g 200 mL/hr over 30 Minutes Intravenous On call to O.R. 01/30/17 8127 01/30/17 0741   01/30/17 0554  ceFAZolin (ANCEF) IVPB 2g/100 mL premix  Status:  Discontinued     2 g 200 mL/hr over 30 Minutes Intravenous On call to O.R. 01/30/17 0554 01/30/17 1104       Patient was given sequential compression devices, early ambulation, and chemoprophylaxis to prevent DVT.  Patient benefited maximally from hospital stay and there were no complications.  Her right knee aqua cell dressing was changed prior to discharge.  Recent vital signs:  Patient Vitals for the past 24 hrs:  BP Temp Temp src Pulse Resp SpO2  02/02/17 0950 (!) 154/67 - - 71 - -  02/02/17 0642 (!) 123/46 97.6 F (36.4 C) Oral 70 18 97 %  02/01/17 2112 (!) 135/46 98.8 F (37.1 C) Oral 68 18 97 %  02/01/17 1442 (!) 117/39 98.2 F (36.8 C) Oral 68 17 100 %     Recent laboratory studies:   Recent Labs  01/31/17 0509 02/01/17 0526 02/02/17 0356  WBC 12.4* 13.6* 7.9  HGB 12.3 12.4 11.8*  HCT 37.5 37.9 35.4*  PLT 122* 137* 123*  NA 136  --   --   K 4.3  --   --   CL 105  --   --   CO2 26  --   --  BUN 12  --   --   CREATININE 0.77  --   --   GLUCOSE 194*  --   --   CALCIUM 8.8*  --   --      Discharge Medications:   Allergies as of 02/02/2017      Reactions   Dilaudid [hydromorphone Hcl] Other (See Comments)   Flushing, hallucinations      Medication List    STOP taking these medications   amoxicillin-clavulanate 875-125 MG tablet Commonly known as:  AUGMENTIN   OMEGA-3 FATTY ACIDS PO   warfarin 1 MG tablet Commonly known as:  COUMADIN   warfarin 5 MG tablet Commonly known as:  COUMADIN     TAKE these medications   acetaminophen 650 MG CR tablet Commonly known as:  TYLENOL Take 1,300 mg by mouth every 8 (eight) hours as needed for pain.   atenolol 25 MG tablet Commonly known as:  TENORMIN Take 25 mg by mouth daily.   colchicine 0.6 MG tablet Take 0.6 mg by mouth daily.    docusate sodium 100 MG capsule Commonly known as:  COLACE Take 1 capsule (100 mg total) by mouth 2 (two) times daily.   furosemide 20 MG tablet Commonly known as:  LASIX Take 20 mg by mouth daily.   gabapentin 300 MG capsule Commonly known as:  NEURONTIN Take 600 mg by mouth at bedtime.   levothyroxine 75 MCG tablet Commonly known as:  SYNTHROID, LEVOTHROID Take 75 mcg by mouth daily before breakfast.   lovastatin 20 MG tablet Commonly known as:  MEVACOR Take 1 tablet by mouth every other day.   methocarbamol 500 MG tablet Commonly known as:  ROBAXIN Take 1 tablet (500 mg total) by mouth every 6 (six) hours as needed for muscle spasms.   omeprazole 20 MG capsule Commonly known as:  PRILOSEC Take 20 mg by mouth daily as needed (indigestion).   oxyCODONE-acetaminophen 5-325 MG tablet Commonly known as:  PERCOCET/ROXICET Take 1-2 tablets by mouth every 6 (six) hours as needed for severe pain.   rivaroxaban 20 MG Tabs tablet Commonly known as:  XARELTO Take 1 tablet (20 mg total) by mouth daily with supper.   Vitamin D (Ergocalciferol) 50000 units Caps capsule Commonly known as:  DRISDOL Take 50,000 Units by mouth every 7 (seven) days. Friday            Discharge Care Instructions        Start     Ordered   02/02/17 0000  rivaroxaban (XARELTO) 20 MG TABS tablet  Daily with supper     02/02/17 6962   02/02/17 0000  Call MD / Call 911    Comments:  If you experience chest pain or shortness of breath, CALL 911 and be transported to the hospital emergency room.  If you develope a fever above 101 F, pus (white drainage) or increased drainage or redness at the wound, or calf pain, call your surgeon's office.   02/02/17 0822   02/02/17 0000  Constipation Prevention    Comments:  Drink plenty of fluids.  Prune juice may be helpful.  You may use a stool softener, such as Colace (over the counter) 100 mg twice a day.  Use MiraLax (over the counter) for constipation as  needed.   02/02/17 0822   02/02/17 0000  Diet general     02/02/17 0822   02/02/17 0000  Increase activity slowly as tolerated     02/02/17 0822   02/02/17 0000  Weight bearing as tolerated    Question Answer Comment  Laterality right   Extremity Lower      02/02/17 0822   02/02/17 0000  CPM    Comments:  Continuous passive motion machine (CPM):      Use the CPM from 0 to 60 for 8 hours per day.      You may increase by 5-10 per day.  You may break it up into 2 or 3 sessions per day.      Use CPM for 1-2 weeks or until you are told to stop.   02/02/17 0822   02/02/17 0000  Do not put a pillow under the knee. Place it under the heel.     02/02/17 0822   02/02/17 0000  oxyCODONE-acetaminophen (PERCOCET/ROXICET) 5-325 MG tablet  Every 6 hours PRN     02/02/17 0822   01/31/17 0000  methocarbamol (ROBAXIN) 500 MG tablet  Every 6 hours PRN    Question:  Supervising Provider  Answer:  Phylliss Bob   01/31/17 1023   01/30/17 0000  docusate sodium (COLACE) 100 MG capsule  2 times daily     01/30/17 0949      Diagnostic Studies: Dg Chest 2 View  Result Date: 01/23/2017 CLINICAL DATA:  Preop for knee replacement.  Hypertension. EXAM: CHEST  2 VIEW COMPARISON:  Radiographs of September 09, 2011. FINDINGS: Stable cardiomediastinal silhouette. Atherosclerosis of thoracic aorta is noted. No pneumothorax or pleural effusion is noted. Both lungs are clear. Old right rib fracture is noted. IMPRESSION: No active cardiopulmonary disease.  Aortic atherosclerosis. Electronically Signed   By: Marijo Conception, M.D.   On: 01/23/2017 16:28    Disposition: Skilled nursing facility  Discharge Instructions    CPM    Complete by:  As directed    Continuous passive motion machine (CPM):      Use the CPM from 0 to 60 for 8 hours per day.      You may increase by 5-10 per day.  You may break it up into 2 or 3 sessions per day.      Use CPM for 1-2 weeks or until you are told to stop.   Call MD / Call  911    Complete by:  As directed    If you experience chest pain or shortness of breath, CALL 911 and be transported to the hospital emergency room.  If you develope a fever above 101 F, pus (white drainage) or increased drainage or redness at the wound, or calf pain, call your surgeon's office.   Constipation Prevention    Complete by:  As directed    Drink plenty of fluids.  Prune juice may be helpful.  You may use a stool softener, such as Colace (over the counter) 100 mg twice a day.  Use MiraLax (over the counter) for constipation as needed.   Diet general    Complete by:  As directed    Do not put a pillow under the knee. Place it under the heel.    Complete by:  As directed    Increase activity slowly as tolerated    Complete by:  As directed    Weight bearing as tolerated    Complete by:  As directed    Laterality:  right   Extremity:  Lower       Contact information for follow-up providers    Dorna Leitz, MD. Schedule an appointment as soon as possible for a visit in 2  weeks.   Specialty:  Orthopedic Surgery Contact information: [place] [address] [phone]            Contact information for after-discharge care    Destination    [facility] .   Specialty:  [place] information: [address] [place] [phone]                 The patient is given an Rx for Xarelto 20 mg 1 daily which she will take on a chronic basis.  SignedErlene Senters 02/02/2017, 1:57 PM

## 2017-02-02 NOTE — Care Management Important Message (Signed)
Important Message  Patient Details  Name: Debbie Richards MRN: [id number] Date of Birth: 13-Feb-1931   Medicare Important Message Given:  Yes    Kerin Salen 02/02/2017, 10:53 AMImportant Message  Patient Details  Name: Debbie Richards MRN: [id number] Date of Birth: October 05, 1930   Medicare Important Message Given:  Yes    Kerin Salen 02/02/2017, 10:53 AM

## 2017-02-02 NOTE — Progress Notes (Signed)
Subjective: 3 Days Post-Op Procedure(s) (LRB): RIGHT TOTAL KNEE ARTHROPLASTY (Right) Patient reports pain as moderate.  Taking by mouth okay. Voiding okay. No BM yet. Making progress with physical therapy although she reports that her right knee is sore. Denies chest pain or shortness of breath  Objective: Vital signs in last 24 hours: Temp:  [97.6 F (36.4 C)-98.8 F (37.1 C)] 97.6 F (36.4 C) (08/27 0642) Pulse Rate:  [68-70] 70 (08/27 0642) Resp:  [17-18] 18 (08/27 0642) BP: (117-135)/(39-46) 123/46 (08/27 0642) SpO2:  [97 %-100 %] 97 % (08/27 0642)  Intake/Output from previous day: 08/26 0701 - 08/27 0700 In: 1440 [P.O.:1440] Out: 200 [Urine:200] Intake/Output this shift: No intake/output data recorded.   Recent Labs  01/31/17 0509 02/01/17 0526 02/02/17 0356  HGB 12.3 12.4 11.8*    Recent Labs  02/01/17 0526 02/02/17 0356  WBC 13.6* 7.9  RBC 4.01 3.70*  HCT 37.9 35.4*  PLT 137* 123*    Recent Labs  01/31/17 0509  NA 136  K 4.3  CL 105  CO2 26  BUN 12  CREATININE 0.77  GLUCOSE 194*  CALCIUM 8.8*   No results for input(s): LABPT, INR in the last 72 hours. Right knee exam:  Neurovascular intact Sensation intact distally Intact pulses distally Dorsiflexion/Plantar flexion intact Incision: moderate drainage Compartment soft Mild bloody drainage under Aquacell dressing. No sign of redness or infection Assessment/Plan: 3 Days Post-Op Procedure(s) (LRB): RIGHT TOTAL KNEE ARTHROPLASTY (Right) Plan: Up with therapy Discharge to SNF Aquasol dressing changed by nursing staff. Xarelto 20 mg daily for DVT prophylaxis also to be used long-term. Weight-bear as tolerated on right. Follow-up with Dr. Berenice Primas in 2 weeks. [place] 02/02/2017, 8:17 AM

## 2017-02-02 NOTE — Progress Notes (Signed)
Medication changes completed by physician.  Updated  D/C faxed.  2:00pm [facility] called for transport.  Whitney at [facility], Latanya Presser, MSW Clinical Social Worker 5E and [place] [phone] 02/02/2017  2:08 PM

## 2017-02-02 NOTE — Progress Notes (Signed)
report called to [place] at [facility] [place]. IV discontinued. Dressing right knee changed per PA's request. Otherwise ready for discharge

## 2017-02-04 DIAGNOSIS — Z471 Aftercare following joint replacement surgery: Secondary | ICD-10-CM | POA: Diagnosis not present

## 2017-02-04 DIAGNOSIS — M1711 Unilateral primary osteoarthritis, right knee: Secondary | ICD-10-CM | POA: Diagnosis not present

## 2017-02-04 DIAGNOSIS — Z86711 Personal history of pulmonary embolism: Secondary | ICD-10-CM | POA: Diagnosis not present

## 2017-02-04 DIAGNOSIS — K5900 Constipation, unspecified: Secondary | ICD-10-CM | POA: Diagnosis not present

## 2017-02-04 DIAGNOSIS — M797 Fibromyalgia: Secondary | ICD-10-CM | POA: Diagnosis not present

## 2017-02-06 DIAGNOSIS — G6189 Other inflammatory polyneuropathies: Secondary | ICD-10-CM | POA: Diagnosis not present

## 2017-02-06 DIAGNOSIS — G4700 Insomnia, unspecified: Secondary | ICD-10-CM | POA: Diagnosis not present

## 2017-02-06 DIAGNOSIS — I999 Unspecified disorder of circulatory system: Secondary | ICD-10-CM | POA: Diagnosis not present

## 2017-02-12 DIAGNOSIS — M1711 Unilateral primary osteoarthritis, right knee: Secondary | ICD-10-CM | POA: Diagnosis not present

## 2017-02-12 DIAGNOSIS — Z96651 Presence of right artificial knee joint: Secondary | ICD-10-CM | POA: Diagnosis not present

## 2017-02-12 DIAGNOSIS — M25561 Pain in right knee: Secondary | ICD-10-CM | POA: Diagnosis not present

## 2017-02-12 DIAGNOSIS — Z471 Aftercare following joint replacement surgery: Secondary | ICD-10-CM | POA: Diagnosis not present

## 2017-02-12 DIAGNOSIS — R262 Difficulty in walking, not elsewhere classified: Secondary | ICD-10-CM | POA: Diagnosis not present

## 2017-02-17 DIAGNOSIS — Z96651 Presence of right artificial knee joint: Secondary | ICD-10-CM | POA: Diagnosis not present

## 2017-02-17 DIAGNOSIS — Z471 Aftercare following joint replacement surgery: Secondary | ICD-10-CM | POA: Diagnosis not present

## 2017-02-17 DIAGNOSIS — M1711 Unilateral primary osteoarthritis, right knee: Secondary | ICD-10-CM | POA: Diagnosis not present

## 2017-02-18 DIAGNOSIS — R262 Difficulty in walking, not elsewhere classified: Secondary | ICD-10-CM | POA: Diagnosis not present

## 2017-02-18 DIAGNOSIS — M25561 Pain in right knee: Secondary | ICD-10-CM | POA: Diagnosis not present

## 2017-02-18 DIAGNOSIS — Z96651 Presence of right artificial knee joint: Secondary | ICD-10-CM | POA: Diagnosis not present

## 2017-02-18 DIAGNOSIS — Z471 Aftercare following joint replacement surgery: Secondary | ICD-10-CM | POA: Diagnosis not present

## 2017-02-20 DIAGNOSIS — Z471 Aftercare following joint replacement surgery: Secondary | ICD-10-CM | POA: Diagnosis not present

## 2017-02-20 DIAGNOSIS — M25561 Pain in right knee: Secondary | ICD-10-CM | POA: Diagnosis not present

## 2017-02-20 DIAGNOSIS — Z96651 Presence of right artificial knee joint: Secondary | ICD-10-CM | POA: Diagnosis not present

## 2017-02-20 DIAGNOSIS — R262 Difficulty in walking, not elsewhere classified: Secondary | ICD-10-CM | POA: Diagnosis not present

## 2017-02-23 DIAGNOSIS — Z471 Aftercare following joint replacement surgery: Secondary | ICD-10-CM | POA: Diagnosis not present

## 2017-02-23 DIAGNOSIS — Z96651 Presence of right artificial knee joint: Secondary | ICD-10-CM | POA: Diagnosis not present

## 2017-02-23 DIAGNOSIS — R262 Difficulty in walking, not elsewhere classified: Secondary | ICD-10-CM | POA: Diagnosis not present

## 2017-02-23 DIAGNOSIS — M25561 Pain in right knee: Secondary | ICD-10-CM | POA: Diagnosis not present

## 2017-02-25 DIAGNOSIS — Z96651 Presence of right artificial knee joint: Secondary | ICD-10-CM | POA: Diagnosis not present

## 2017-02-25 DIAGNOSIS — M25561 Pain in right knee: Secondary | ICD-10-CM | POA: Diagnosis not present

## 2017-02-25 DIAGNOSIS — Z471 Aftercare following joint replacement surgery: Secondary | ICD-10-CM | POA: Diagnosis not present

## 2017-02-25 DIAGNOSIS — R262 Difficulty in walking, not elsewhere classified: Secondary | ICD-10-CM | POA: Diagnosis not present

## 2017-03-02 DIAGNOSIS — Z7901 Long term (current) use of anticoagulants: Secondary | ICD-10-CM | POA: Diagnosis not present

## 2017-03-02 DIAGNOSIS — I482 Chronic atrial fibrillation: Secondary | ICD-10-CM | POA: Diagnosis not present

## 2017-03-02 DIAGNOSIS — Z471 Aftercare following joint replacement surgery: Secondary | ICD-10-CM | POA: Diagnosis not present

## 2017-03-02 DIAGNOSIS — Z96651 Presence of right artificial knee joint: Secondary | ICD-10-CM | POA: Diagnosis not present

## 2017-03-02 DIAGNOSIS — R262 Difficulty in walking, not elsewhere classified: Secondary | ICD-10-CM | POA: Diagnosis not present

## 2017-03-02 DIAGNOSIS — M25561 Pain in right knee: Secondary | ICD-10-CM | POA: Diagnosis not present

## 2017-03-02 DIAGNOSIS — I10 Essential (primary) hypertension: Secondary | ICD-10-CM | POA: Diagnosis not present

## 2017-03-02 DIAGNOSIS — G629 Polyneuropathy, unspecified: Secondary | ICD-10-CM | POA: Diagnosis not present

## 2017-03-02 DIAGNOSIS — M25569 Pain in unspecified knee: Secondary | ICD-10-CM | POA: Diagnosis not present

## 2017-03-06 DIAGNOSIS — Z96651 Presence of right artificial knee joint: Secondary | ICD-10-CM | POA: Diagnosis not present

## 2017-03-06 DIAGNOSIS — M25561 Pain in right knee: Secondary | ICD-10-CM | POA: Diagnosis not present

## 2017-03-06 DIAGNOSIS — R262 Difficulty in walking, not elsewhere classified: Secondary | ICD-10-CM | POA: Diagnosis not present

## 2017-03-06 DIAGNOSIS — Z471 Aftercare following joint replacement surgery: Secondary | ICD-10-CM | POA: Diagnosis not present

## 2017-03-10 DIAGNOSIS — Z96651 Presence of right artificial knee joint: Secondary | ICD-10-CM | POA: Diagnosis not present

## 2017-03-10 DIAGNOSIS — R262 Difficulty in walking, not elsewhere classified: Secondary | ICD-10-CM | POA: Diagnosis not present

## 2017-03-10 DIAGNOSIS — Z471 Aftercare following joint replacement surgery: Secondary | ICD-10-CM | POA: Diagnosis not present

## 2017-03-10 DIAGNOSIS — M25561 Pain in right knee: Secondary | ICD-10-CM | POA: Diagnosis not present

## 2017-03-11 DIAGNOSIS — Z08 Encounter for follow-up examination after completed treatment for malignant neoplasm: Secondary | ICD-10-CM | POA: Diagnosis not present

## 2017-03-11 DIAGNOSIS — Z85828 Personal history of other malignant neoplasm of skin: Secondary | ICD-10-CM | POA: Diagnosis not present

## 2017-03-16 DIAGNOSIS — Z23 Encounter for immunization: Secondary | ICD-10-CM | POA: Diagnosis not present

## 2017-03-17 DIAGNOSIS — Z471 Aftercare following joint replacement surgery: Secondary | ICD-10-CM | POA: Diagnosis not present

## 2017-03-17 DIAGNOSIS — M25561 Pain in right knee: Secondary | ICD-10-CM | POA: Diagnosis not present

## 2017-03-17 DIAGNOSIS — Z96651 Presence of right artificial knee joint: Secondary | ICD-10-CM | POA: Diagnosis not present

## 2017-03-17 DIAGNOSIS — R262 Difficulty in walking, not elsewhere classified: Secondary | ICD-10-CM | POA: Diagnosis not present

## 2017-03-19 DIAGNOSIS — R262 Difficulty in walking, not elsewhere classified: Secondary | ICD-10-CM | POA: Diagnosis not present

## 2017-03-19 DIAGNOSIS — Z96651 Presence of right artificial knee joint: Secondary | ICD-10-CM | POA: Diagnosis not present

## 2017-03-19 DIAGNOSIS — M25561 Pain in right knee: Secondary | ICD-10-CM | POA: Diagnosis not present

## 2017-03-19 DIAGNOSIS — Z471 Aftercare following joint replacement surgery: Secondary | ICD-10-CM | POA: Diagnosis not present

## 2017-03-23 DIAGNOSIS — Z471 Aftercare following joint replacement surgery: Secondary | ICD-10-CM | POA: Diagnosis not present

## 2017-03-23 DIAGNOSIS — M25561 Pain in right knee: Secondary | ICD-10-CM | POA: Diagnosis not present

## 2017-03-23 DIAGNOSIS — Z96651 Presence of right artificial knee joint: Secondary | ICD-10-CM | POA: Diagnosis not present

## 2017-03-23 DIAGNOSIS — R262 Difficulty in walking, not elsewhere classified: Secondary | ICD-10-CM | POA: Diagnosis not present

## 2017-03-27 DIAGNOSIS — M25561 Pain in right knee: Secondary | ICD-10-CM | POA: Diagnosis not present

## 2017-03-27 DIAGNOSIS — Z471 Aftercare following joint replacement surgery: Secondary | ICD-10-CM | POA: Diagnosis not present

## 2017-03-27 DIAGNOSIS — R262 Difficulty in walking, not elsewhere classified: Secondary | ICD-10-CM | POA: Diagnosis not present

## 2017-03-27 DIAGNOSIS — Z96651 Presence of right artificial knee joint: Secondary | ICD-10-CM | POA: Diagnosis not present

## 2017-04-07 DIAGNOSIS — Z471 Aftercare following joint replacement surgery: Secondary | ICD-10-CM | POA: Diagnosis not present

## 2017-04-07 DIAGNOSIS — M25561 Pain in right knee: Secondary | ICD-10-CM | POA: Diagnosis not present

## 2017-04-07 DIAGNOSIS — Z96651 Presence of right artificial knee joint: Secondary | ICD-10-CM | POA: Diagnosis not present

## 2017-04-28 DIAGNOSIS — E042 Nontoxic multinodular goiter: Secondary | ICD-10-CM | POA: Diagnosis not present

## 2017-04-28 DIAGNOSIS — I482 Chronic atrial fibrillation: Secondary | ICD-10-CM | POA: Diagnosis not present

## 2017-04-28 DIAGNOSIS — K219 Gastro-esophageal reflux disease without esophagitis: Secondary | ICD-10-CM | POA: Diagnosis not present

## 2017-04-28 DIAGNOSIS — I10 Essential (primary) hypertension: Secondary | ICD-10-CM | POA: Diagnosis not present

## 2017-04-28 DIAGNOSIS — Z86718 Personal history of other venous thrombosis and embolism: Secondary | ICD-10-CM | POA: Diagnosis not present

## 2017-04-28 DIAGNOSIS — G629 Polyneuropathy, unspecified: Secondary | ICD-10-CM | POA: Diagnosis not present

## 2017-04-28 DIAGNOSIS — E7800 Pure hypercholesterolemia, unspecified: Secondary | ICD-10-CM | POA: Diagnosis not present

## 2017-05-06 DIAGNOSIS — H9212 Otorrhea, left ear: Secondary | ICD-10-CM | POA: Diagnosis not present

## 2017-05-06 DIAGNOSIS — H7292 Unspecified perforation of tympanic membrane, left ear: Secondary | ICD-10-CM | POA: Diagnosis not present

## 2017-05-06 DIAGNOSIS — H8112 Benign paroxysmal vertigo, left ear: Secondary | ICD-10-CM | POA: Diagnosis not present

## 2017-05-14 DIAGNOSIS — H7292 Unspecified perforation of tympanic membrane, left ear: Secondary | ICD-10-CM | POA: Diagnosis not present

## 2017-05-14 DIAGNOSIS — K148 Other diseases of tongue: Secondary | ICD-10-CM | POA: Diagnosis not present

## 2017-07-07 DIAGNOSIS — M25561 Pain in right knee: Secondary | ICD-10-CM | POA: Diagnosis not present

## 2017-07-25 IMAGING — CT CT ABD-PELV W/ CM
2 of 5 series · 17 of 46 positions shown, 19 images · IV contrast (Omni 300)
Comparison: 09/09/2011, 06/30/2003

CLINICAL DATA: Abdominal pain and symptoms of constipation

EXAM:
CT ABDOMEN AND PELVIS WITH CONTRAST
TECHNIQUE: Multidetector CT imaging of the abdomen and pelvis was performed
using the standard protocol following bolus administration of
intravenous contrast.
CONTRAST:  80mL P89PGA-633 IOPAMIDOL (P89PGA-633) INJECTION 61%

[Series 2: a/p w/ 5mm · axial · 0.97mm/px · z∈[-525,-75]mm · 14 of 104 slices shown, 16 images]
[im 7/104  soft-tissue]
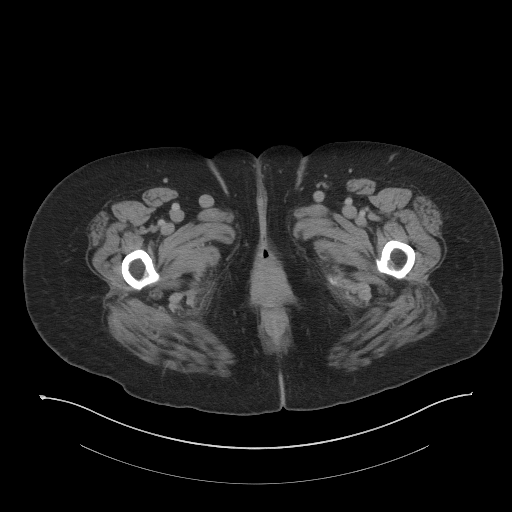
[im 7/104  bone]
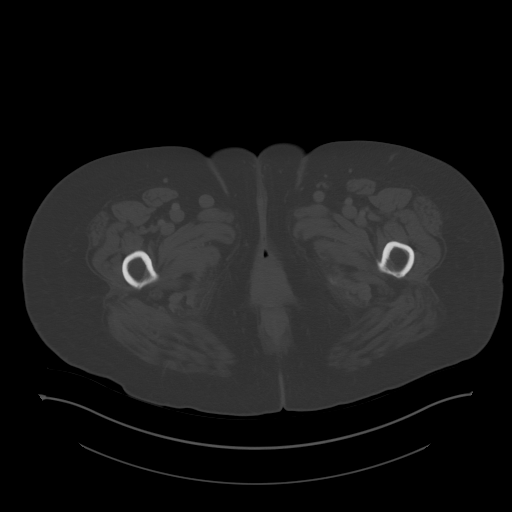
[im 13/104  soft-tissue]
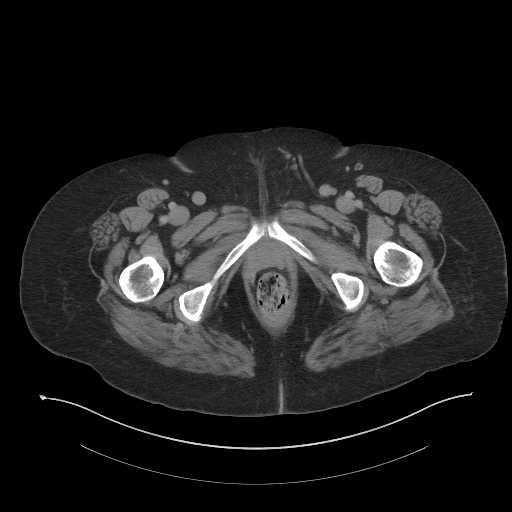
[im 19/104  soft-tissue]
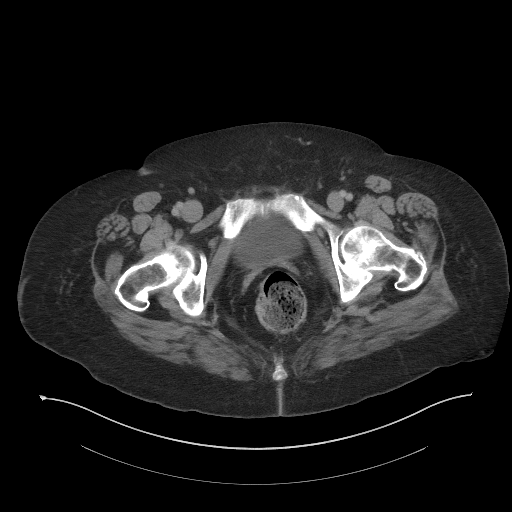
[im 31/104  soft-tissue]
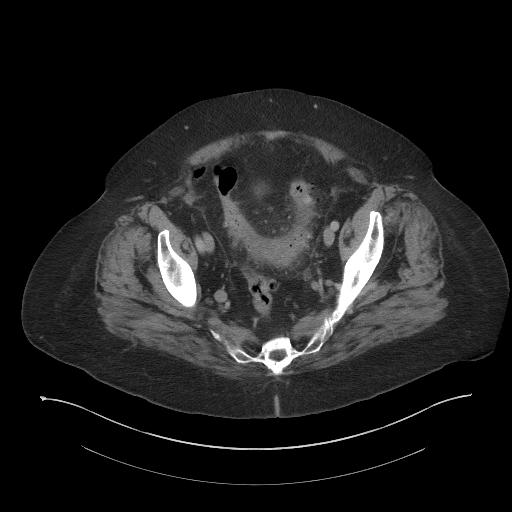
[im 37/104  soft-tissue]
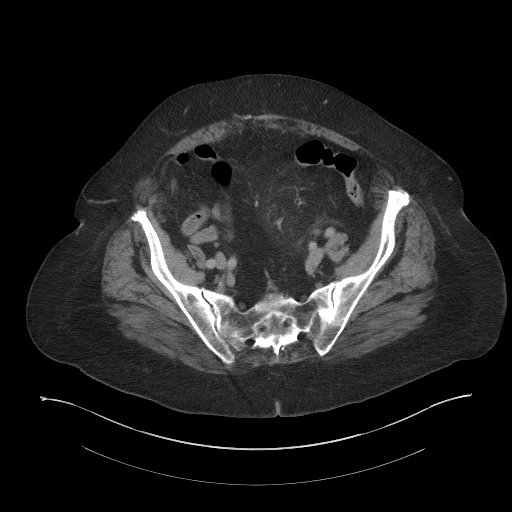
[im 43/104  soft-tissue]
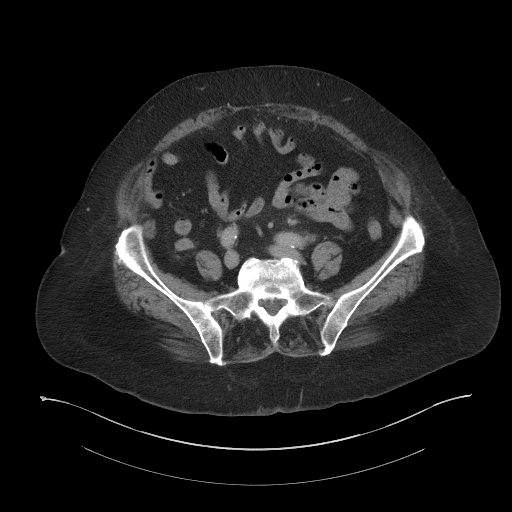
[im 49/104  soft-tissue]
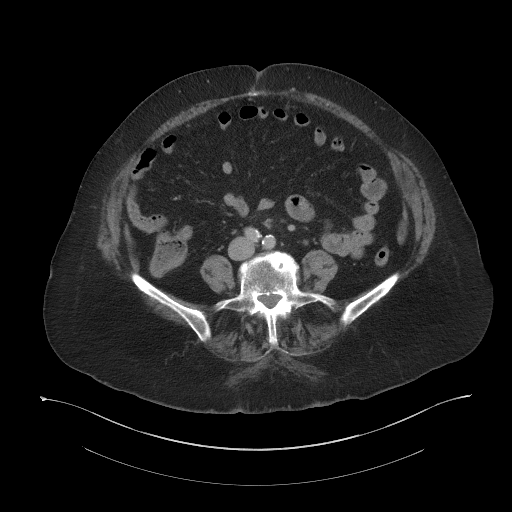
[im 55/104  soft-tissue]
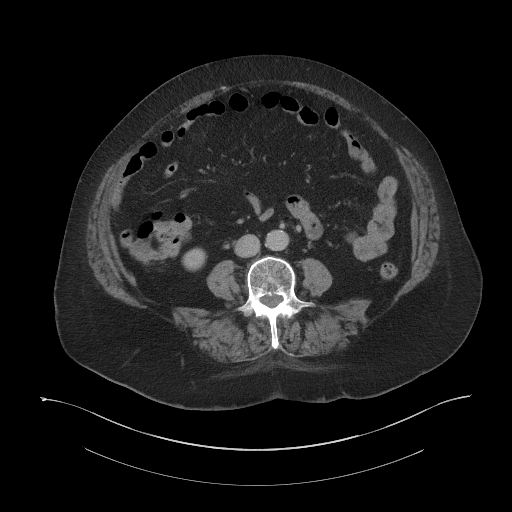
[im 61/104  soft-tissue]
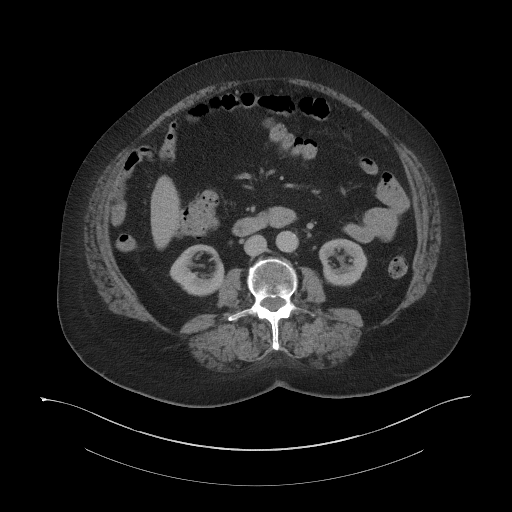
[im 61/104  bone]
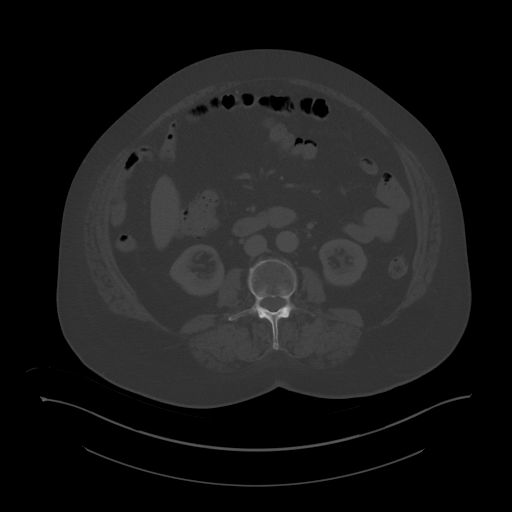
[im 67/104  soft-tissue]
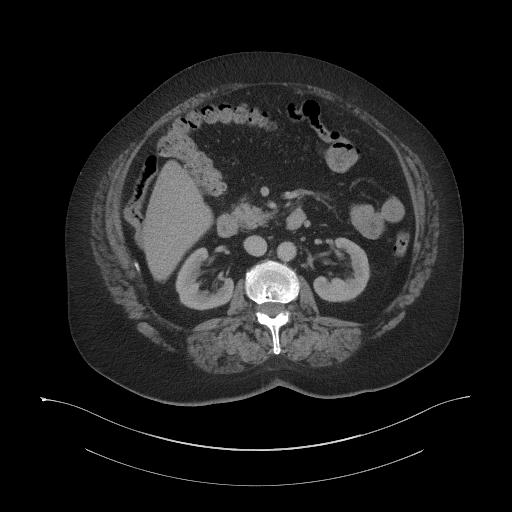
[im 79/104  soft-tissue]
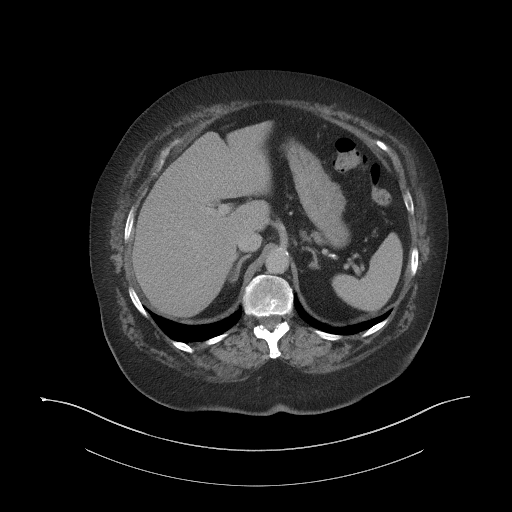
[im 85/104  soft-tissue]
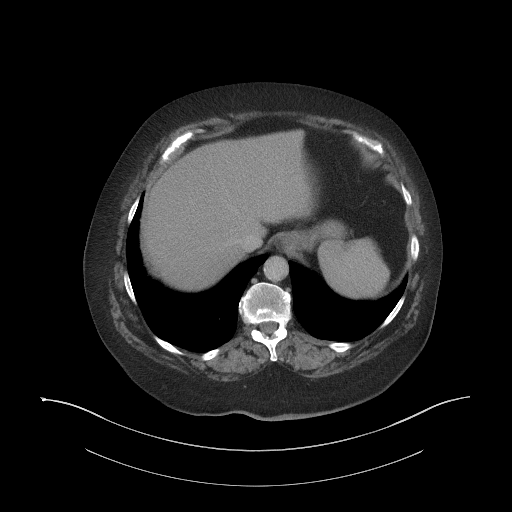
[im 91/104  soft-tissue]
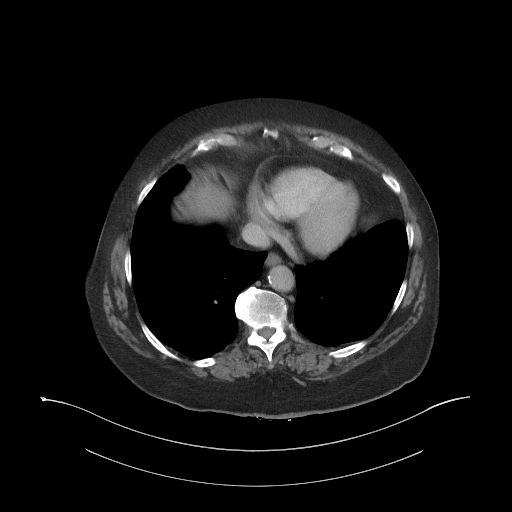
[im 97/104  soft-tissue]
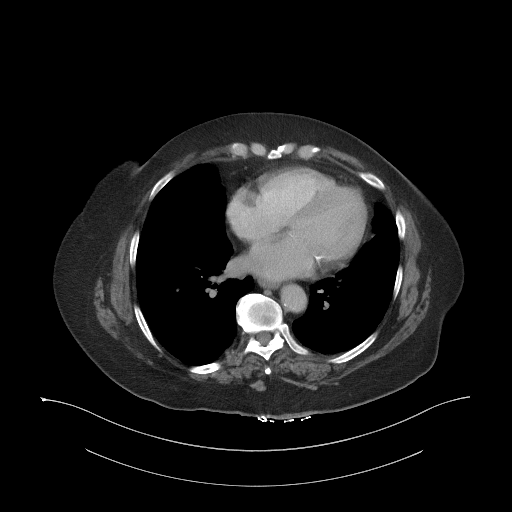

[Series 5: a/p w/ cor · coronal · 0.93mm/px · 3 of 176 slices shown]
[im 59/176  soft-tissue]
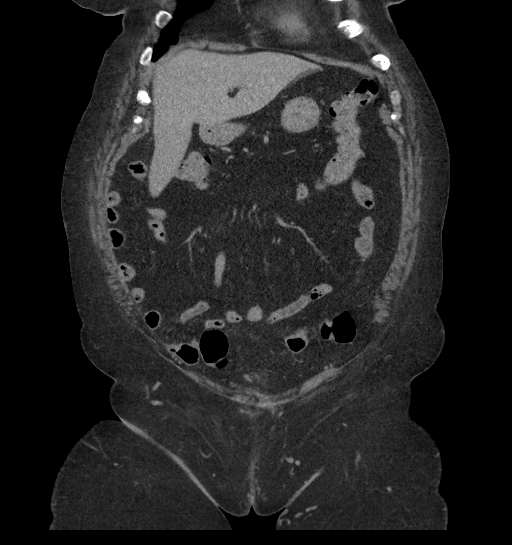
[im 78/176  soft-tissue]
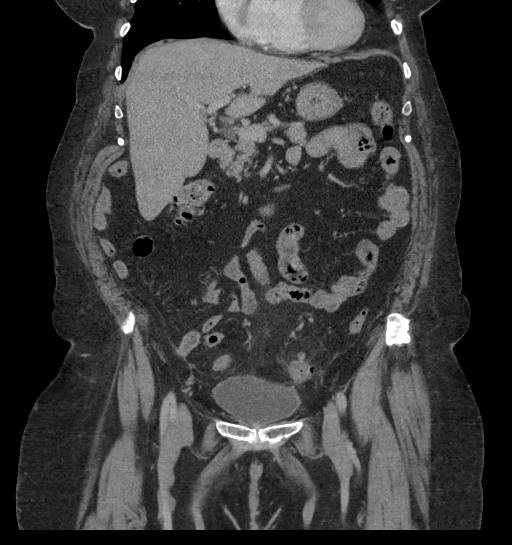
[im 98/176  soft-tissue]
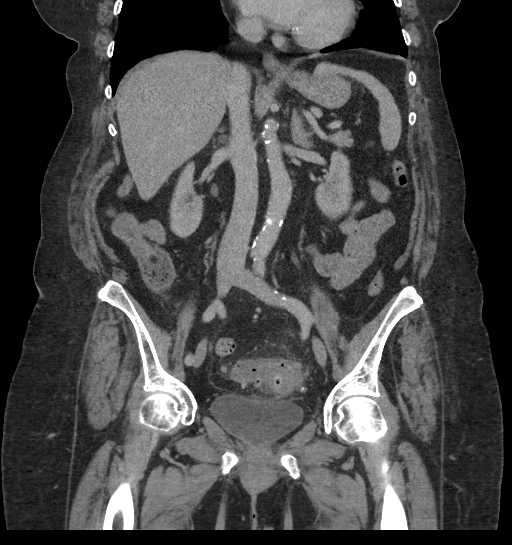

[17 of 46 positions shown; findings below may reference images not displayed]

FINDINGS: Lower chest: Well-aerated without focal infiltrate. A 6 mm nodule is
noted in the right middle lobe stable from a previous exam from 5224

Hepatobiliary: Gallbladder has been surgically removed. The liver is
within normal limits.

Pancreas: No mass, inflammatory changes, or other significant
abnormality.

Spleen: Within normal limits in size and appearance.

Adrenals/Urinary Tract: Stable right renal cysts. No calculi or
obstructive changes are noted. The bladder is well distended.

Stomach/Bowel: The appendix is been surgically removed. There are
changes consistent with diverticulitis in the sigmoid colon. No
perforation or focal abscess is identified.

Vascular/Lymphatic: No pathologically enlarged lymph nodes. No
evidence of abdominal aortic aneurysm.

Reproductive: Uterus has been surgically removed.

Other: None.

Musculoskeletal:  No suspicious bone lesions identified.
IMPRESSION: Sigmoid diverticulitis without evidence of perforation or abscess
formation.

6 mm right middle lobe nodule stable from 5224.

Postsurgical changes as described.

## 2017-10-26 DIAGNOSIS — Z86718 Personal history of other venous thrombosis and embolism: Secondary | ICD-10-CM | POA: Diagnosis not present

## 2017-10-26 DIAGNOSIS — M797 Fibromyalgia: Secondary | ICD-10-CM | POA: Diagnosis not present

## 2017-10-26 DIAGNOSIS — E850 Non-neuropathic heredofamilial amyloidosis: Secondary | ICD-10-CM | POA: Diagnosis not present

## 2017-10-26 DIAGNOSIS — E042 Nontoxic multinodular goiter: Secondary | ICD-10-CM | POA: Diagnosis not present

## 2017-10-26 DIAGNOSIS — Z0000 Encounter for general adult medical examination without abnormal findings: Secondary | ICD-10-CM | POA: Diagnosis not present

## 2017-10-26 DIAGNOSIS — E7800 Pure hypercholesterolemia, unspecified: Secondary | ICD-10-CM | POA: Diagnosis not present

## 2017-10-26 DIAGNOSIS — K219 Gastro-esophageal reflux disease without esophagitis: Secondary | ICD-10-CM | POA: Diagnosis not present

## 2017-10-26 DIAGNOSIS — I10 Essential (primary) hypertension: Secondary | ICD-10-CM | POA: Diagnosis not present

## 2017-10-26 DIAGNOSIS — G629 Polyneuropathy, unspecified: Secondary | ICD-10-CM | POA: Diagnosis not present

## 2017-10-26 DIAGNOSIS — Z7901 Long term (current) use of anticoagulants: Secondary | ICD-10-CM | POA: Diagnosis not present

## 2018-01-04 DIAGNOSIS — M25561 Pain in right knee: Secondary | ICD-10-CM | POA: Diagnosis not present

## 2018-01-18 DIAGNOSIS — K148 Other diseases of tongue: Secondary | ICD-10-CM | POA: Diagnosis not present

## 2018-01-18 DIAGNOSIS — H7292 Unspecified perforation of tympanic membrane, left ear: Secondary | ICD-10-CM | POA: Diagnosis not present

## 2018-01-25 DIAGNOSIS — M1712 Unilateral primary osteoarthritis, left knee: Secondary | ICD-10-CM | POA: Diagnosis not present

## 2018-02-24 DIAGNOSIS — Z135 Encounter for screening for eye and ear disorders: Secondary | ICD-10-CM | POA: Diagnosis not present

## 2018-02-24 DIAGNOSIS — H52223 Regular astigmatism, bilateral: Secondary | ICD-10-CM | POA: Diagnosis not present

## 2018-02-24 DIAGNOSIS — H524 Presbyopia: Secondary | ICD-10-CM | POA: Diagnosis not present

## 2018-02-24 DIAGNOSIS — H04123 Dry eye syndrome of bilateral lacrimal glands: Secondary | ICD-10-CM | POA: Diagnosis not present

## 2018-02-24 DIAGNOSIS — H5203 Hypermetropia, bilateral: Secondary | ICD-10-CM | POA: Diagnosis not present

## 2018-03-30 DIAGNOSIS — Z23 Encounter for immunization: Secondary | ICD-10-CM | POA: Diagnosis not present

## 2018-04-28 DIAGNOSIS — E850 Non-neuropathic heredofamilial amyloidosis: Secondary | ICD-10-CM | POA: Diagnosis not present

## 2018-04-28 DIAGNOSIS — I10 Essential (primary) hypertension: Secondary | ICD-10-CM | POA: Diagnosis not present

## 2018-04-28 DIAGNOSIS — Z86718 Personal history of other venous thrombosis and embolism: Secondary | ICD-10-CM | POA: Diagnosis not present

## 2018-04-28 DIAGNOSIS — M797 Fibromyalgia: Secondary | ICD-10-CM | POA: Diagnosis not present

## 2018-04-28 DIAGNOSIS — E042 Nontoxic multinodular goiter: Secondary | ICD-10-CM | POA: Diagnosis not present

## 2018-04-28 DIAGNOSIS — G629 Polyneuropathy, unspecified: Secondary | ICD-10-CM | POA: Diagnosis not present

## 2018-04-28 DIAGNOSIS — Z7901 Long term (current) use of anticoagulants: Secondary | ICD-10-CM | POA: Diagnosis not present

## 2018-04-28 DIAGNOSIS — K219 Gastro-esophageal reflux disease without esophagitis: Secondary | ICD-10-CM | POA: Diagnosis not present

## 2018-04-28 DIAGNOSIS — E7800 Pure hypercholesterolemia, unspecified: Secondary | ICD-10-CM | POA: Diagnosis not present

## 2018-05-13 DIAGNOSIS — E785 Hyperlipidemia, unspecified: Secondary | ICD-10-CM | POA: Diagnosis not present

## 2018-05-13 DIAGNOSIS — I4510 Unspecified right bundle-branch block: Secondary | ICD-10-CM | POA: Diagnosis not present

## 2018-05-13 DIAGNOSIS — Z86718 Personal history of other venous thrombosis and embolism: Secondary | ICD-10-CM | POA: Diagnosis not present

## 2018-05-13 DIAGNOSIS — I10 Essential (primary) hypertension: Secondary | ICD-10-CM | POA: Diagnosis not present

## 2018-06-28 DIAGNOSIS — I4510 Unspecified right bundle-branch block: Secondary | ICD-10-CM | POA: Insufficient documentation

## 2018-06-28 DIAGNOSIS — R7303 Prediabetes: Secondary | ICD-10-CM | POA: Insufficient documentation

## 2018-06-28 DIAGNOSIS — G629 Polyneuropathy, unspecified: Secondary | ICD-10-CM

## 2018-07-12 ENCOUNTER — Other Ambulatory Visit: Payer: Self-pay | Admitting: Cardiology

## 2018-07-12 DIAGNOSIS — I10 Essential (primary) hypertension: Secondary | ICD-10-CM | POA: Diagnosis not present

## 2018-07-13 LAB — LIPID PANEL W/O CHOL/HDL RATIO
Cholesterol, Total: 180 mg/dL (ref 100–199)
HDL: 62 mg/dL (ref >39)
LDL Calculated: 92 mg/dL (ref 0–99)
Triglycerides: 130 mg/dL (ref 0–149)
VLDL Cholesterol Cal: 26 mg/dL (ref 5–40)

## 2018-08-11 NOTE — Progress Notes (Signed)
Subjective:   Debbie Richards, female    DOB: 03-29-1931, 85 y.o.   MRN: [id number]  Orpah Melter, MD:  Chief Complaint  Patient presents with  . Hyperlipidemia  . Results    Labs  . Follow-up    5mo   HPI: Debbie Richards is a 85y.o. 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  She is here on 3 month follow up. She is tolerating Livalo well; however, is concerned about the cost. She has also had 2 episodes of pain in her shoulders with pain radiation down her left arm since last seen by me. No associated chest pain or shortness of breath. Occurred while resting in her recliner and lasted for a few minutes. States she had pain like this previously with other statins. She has not been able to tolerate other statins including Lipitor, Crestor, Zocor, and also that he had in the past due to myalgia symptoms. There is interaction with lovastatin with her colchicine and which she takes for Mediterranean fever. Has not had fever in the last several years since being on colchicine.  She has chronic shortness of breath on exertion that has been present for several years and she states has been stable. Denies any chest pain. Has chronic leg edema from venous insufficiency from previous DVT. Unsure of actual years of her previous bilateral DVTs and also her pulmonary embolus. She is now on Xarelto for management of this. No PND, orthopnea, dizziness, or syncope.  She does not exercise, but is fairly active with mainitaing her own house. She continues to live alone.   Past Medical History:  Diagnosis Date  . Borderline diabetes   . Chronic back pain   . Chronic lower back pain   . Clotting disorder ([place]   . Deep [place] thrombosis (DVT) ([place]   . DVT,  lower extremity ([place] 01/2002; 06/2003   RLE; LLE  . Enlarged thyroid gland   . Familial Mediterranean fever ([facility]   . Fibromyalgia   . Fibromyalgia   . Gallstones   . GERD (gastroesophageal reflux disease)   . GERD (gastroesophageal reflux disease)   . H/O hiatal hernia   . History of stomach ulcers   . Hyperlipidemia   . Hypertension   . Hypothyroidism   . Insomnia   . Migraine    "I've had 4; last time was when I was in my ?30's"  . Osteoarthritis   . Phlebitis    LLE  . Pneumonia   . Pulmonary embolism ([facility] 2003  . Shortness of breath on exertion   . Tinnitus     Past Surgical History:  Procedure Laterality Date  . ABDOMINAL HYSTERECTOMY  1972  . APPENDECTOMY  1960  . bladder tack    . BREAST BIOPSY     left  . [place]  right  . CATARACT EXTRACTION, BILATERAL  ~ 2011  . CHOLECYSTECTOMY  09/12/2011   Procedure: LAPAROSCOPIC CHOLECYSTECTOMY WITH INTRAOPERATIVE CHOLANGIOGRAM;  Surgeon: DShann Medal MD;  [facility]  Service: General;  Laterality: N/A;  . excision of goiter    . EXTERNAL EAR SURGERY     x3  . HERNIA REPAIR  1970's   ventral  . MASS EXCISION  12/2001  paravertebral chest mass  . [place]; 1982; after 1982  . mini thoracotomy  12/2001  . THORACOTOMY  2003   procedure done   to remove growth in lung   . TOTAL KNEE ARTHROPLASTY Right 01/30/2017   Procedure: RIGHT TOTAL KNEE ARTHROPLASTY;  Surgeon: Dorna Leitz, MD;  Location: [facility];  Service: Orthopedics;  Laterality: Right;  With adductor canal block    Family History  Problem Relation Age of Onset  . Heart disease Mother        had CHF  . Hyperlipidemia Mother   . Hypertension Mother   . Stroke Father   . Stroke Brother     Social History   Socioeconomic History  . Marital status: Widowed    Spouse name: Not on file  . Number of children: 2  . Years of education: Not on file  . Highest education level: Not on file  Occupational History  .  Not on file  Social Needs  . Financial resource strain: Not on file  . Food insecurity:    Worry: Not on file    Inability: Not on file  . Transportation needs:    Medical: Not on file    Non-medical: Not on file  Tobacco Use  . Smoking status: Never Smoker  . Smokeless tobacco: Never Used  Substance and Sexual Activity  . Alcohol use: No  . Drug use: No  . Sexual activity: Never  Lifestyle  . Physical activity:    Days per week: Not on file    Minutes per session: Not on file  . Stress: Not on file  Relationships  . Social connections:    Talks on phone: Not on file    Gets together: Not on file    Attends religious service: Not on file    Active member of club or organization: Not on file    Attends meetings of clubs or organizations: Not on file    Relationship status: Not on file  . Intimate partner violence:    Fear of current or ex partner: Not on file    Emotionally abused: Not on file    Physically abused: Not on file    Forced sexual activity: Not on file  Other Topics Concern  . Not on file  Social History Narrative  . Not on file    Current Meds  Medication Sig  . acetaminophen (TYLENOL) 650 MG CR tablet Take 1,300 mg by mouth every 8 (eight) hours as needed for pain.  Marland Kitchen atenolol (TENORMIN) 25 MG tablet Take 25 mg by mouth daily.  . colchicine 0.6 MG tablet Take 0.6 mg by mouth daily.  Marland Kitchen docusate sodium (COLACE) 100 MG capsule Take 1 capsule (100 mg total) by mouth 2 (two) times daily. (Patient taking differently: Take 100 mg by mouth 2 (two) times daily as needed. )  . furosemide (LASIX) 20 MG tablet Take 20 mg by mouth daily.   Marland Kitchen levothyroxine (SYNTHROID, LEVOTHROID) 75 MCG tablet Take 75 mcg by mouth daily before breakfast.   . omeprazole (PRILOSEC) 20 MG capsule Take 20 mg by mouth daily as needed (indigestion).   . Pitavastatin Calcium (LIVALO) 2 MG TABS Take 1 tablet by mouth daily.  . pregabalin (LYRICA) 50 MG capsule Take 50 mg by mouth daily.  .  rivaroxaban (XARELTO) 20 MG TABS tablet Take 1 tablet (20 mg total) by mouth daily with supper.  . [DISCONTINUED] methocarbamol (ROBAXIN) 500 MG tablet Take 1 tablet (500  mg total) by mouth every 6 (six) hours as needed for muscle spasms.  . [DISCONTINUED] Vitamin D, Ergocalciferol, (DRISDOL) 50000 units CAPS capsule Take 50,000 Units by mouth every 7 (seven) days. Friday     Review of Systems  Constitution: Positive for malaise/fatigue. Negative for decreased appetite, weight gain and weight loss.  Eyes: Negative for visual disturbance.  Cardiovascular: Positive for dyspnea on exertion. Negative for chest pain, claudication, leg swelling, orthopnea, palpitations and syncope.  Respiratory: Negative for hemoptysis and wheezing.   Endocrine: Negative for cold intolerance and heat intolerance.  Hematologic/Lymphatic: Does not bruise/bleed easily.  Skin: Negative for nail changes.  Musculoskeletal: Positive for joint pain (left arm pain). Negative for muscle weakness and myalgias.  Gastrointestinal: Negative for abdominal pain, change in bowel habit, nausea and vomiting.  Neurological: Positive for numbness (neuropathy). Negative for difficulty with concentration, dizziness, focal weakness and headaches.  Psychiatric/Behavioral: Negative for altered mental status and suicidal ideas.  All other systems reviewed and are negative.      Objective:     Blood pressure 127/69, pulse 68, height 5' 10"  (1.778 m), weight 255 lb 6.4 oz (115.8 kg), SpO2 97 %.  Cardiac studies:  Echo 09/10/2011: Left ventricle: The cavity size was normal. Wall thickness was normal. Systolic function was normal. The estimated ejection fraction was in the range of 55% to 60%. Wall motion was normal; there were no regional wall motion abnormalities. - Mitral valve: Calcified annulus. Mildly thickened leaflets . Trivial prolapse, involving the anterior leaflet. - Left atrium: The atrium was mildly dilated. -  Pulmonary arteries: Systolic pressure was mildly increased. PA peak pressure: 64m Hg (S).  Nuclear stress test in 2013: MYOCARDIAL IMAGING WITH SPECT (REST AND PHARMACOLOGIC-STRESS - 2 DAY PROTOCOL) GATED LEFT VENTRICULAR WALL MOTION STUDY LEFT VENTRICULAR EJECTION FRACTION Technique: Standard myocardial SPECT imaging was performed after intravenous injection of 30 mCi Tc-937metrofosmin at rest. On a different day, intravenous infusion of Lexiscan was performed under supervision of the Cardiology staff. At peak effect of the drug, 30 mCi Tc-9981mtrofosmin the was injected intravenously and standard myocardial SPECT imaging was performed. Quantitative gated imaging was also performed to evaluate left ventricular wall motion and estimate left ventricular ejection fraction. Comparison: None Findings: SPECT imaging demonstrates no reversible or irreversible defects to suggest ischemia or infarction. Quantitative gated analysis shows normal wall motion. The resting left ventricular ejection fraction is 76% with end- diastolic volume of 77 ml and end-systolic volume of 18 ml. IMPRESSION: No evidence of ischemia or infarction. Ejection fraction 76%.  Labs 04/28/2018: Cholesterol 241, triglycerides 145, HDL 59, LDL 152. TSH normal. Creatinine 0.78, EGFR 70/85, potassium 4.6, CMP normal.  Lipid Panel     Component Value Date/Time   CHOL 180 07/12/2018 1251   TRIG 130 07/12/2018 1251   HDL 62 07/12/2018 1251   LDLCALC 92 07/12/2018 1251     Physical Exam  Constitutional: She is oriented to person, place, and time. Vital signs are normal. She appears well-developed and well-nourished.  HENT:  Head: Normocephalic and atraumatic.  Neck: Normal range of motion.  Cardiovascular: Normal rate, regular rhythm, normal heart sounds and intact distal pulses.  Pulmonary/Chest: Effort normal and breath sounds normal. No accessory muscle usage. No respiratory distress.  Abdominal: Soft. Bowel sounds  are normal.  Musculoskeletal: Normal range of motion.  Neurological: She is alert and oriented to person, place, and time.  Skin: Skin is warm and dry.  Vitals reviewed.       Assessment &  Recommendations:   1. Mixed hyperlipidemia Improved with Livalo and is overall tolerating fairly well. Cost may be an issue. Will see if there is assistance for Livalo. In view of her age, would not be aggressive in managing her hyperlipidemia. Do not feel that PCSK9 inhibitor is an option.   2. Left upper arm pain Has had 2 episodes not associated with exertion. Occurs in bilateral shoulders, but does radiate down her left arm. No other associated symptoms concerning for angina. Possibly related to fibromyalgia or Livalo as she states she had this also with other statins? Will continue to monitor.   3. Shortness of breath on exertion Has shortness of breath on exertion as well as fatigue that is unchanged. Likely secondary to obesity and deconditioning. She has had negative nuclear stress test in 2013; however, has had new onset RBBB since that time. She did have right knee arthroplasty in 4739 without CV complications. I have discussed further evaluation with repeating stress testing; however, patient does not wish to have stress test performed at this time. I feel this is reasonable as she hasn't had worsening symptoms. Encouraged her to contact me should symptoms worsen.   4. Right bundle branch block Newly noted in 2018. As discussed above.   5. Essential hypertension Well controlled on current medical therapy. No changes were made to medications today.   I'll see her back in 2-3 months for follow up on hyperlipidemia.    Jeri Lager, MSN, APRN, FNP-C [facility], [place] Office: [phone] Fax: [phone]

## 2018-08-12 ENCOUNTER — Encounter: Payer: Self-pay | Admitting: Cardiology

## 2018-08-12 ENCOUNTER — Ambulatory Visit (INDEPENDENT_AMBULATORY_CARE_PROVIDER_SITE_OTHER): Payer: Medicare Other | Admitting: Cardiology

## 2018-08-12 VITALS — BP 127/69 | HR 68 | Ht 70.0 in | Wt 255.4 lb

## 2018-08-12 DIAGNOSIS — I4510 Unspecified right bundle-branch block: Secondary | ICD-10-CM

## 2018-08-12 DIAGNOSIS — M79622 Pain in left upper arm: Secondary | ICD-10-CM

## 2018-08-12 DIAGNOSIS — R0602 Shortness of breath: Secondary | ICD-10-CM

## 2018-08-12 DIAGNOSIS — I10 Essential (primary) hypertension: Secondary | ICD-10-CM

## 2018-08-12 DIAGNOSIS — E782 Mixed hyperlipidemia: Secondary | ICD-10-CM | POA: Diagnosis not present

## 2018-08-19 DIAGNOSIS — H9212 Otorrhea, left ear: Secondary | ICD-10-CM | POA: Diagnosis not present

## 2018-08-19 DIAGNOSIS — H7292 Unspecified perforation of tympanic membrane, left ear: Secondary | ICD-10-CM | POA: Diagnosis not present

## 2018-08-24 ENCOUNTER — Other Ambulatory Visit: Payer: Self-pay

## 2018-08-24 DIAGNOSIS — K5792 Diverticulitis of intestine, part unspecified, without perforation or abscess without bleeding: Secondary | ICD-10-CM

## 2018-08-24 MED ORDER — PITAVASTATIN CALCIUM 2 MG PO TABS: 1.0000 | ORAL_TABLET | Freq: Every day | ORAL | 3 refills | Status: DC

## 2018-10-13 ENCOUNTER — Ambulatory Visit: Payer: PRIVATE HEALTH INSURANCE | Admitting: Cardiology

## 2018-10-27 DIAGNOSIS — Z86718 Personal history of other venous thrombosis and embolism: Secondary | ICD-10-CM | POA: Diagnosis not present

## 2018-10-27 DIAGNOSIS — I10 Essential (primary) hypertension: Secondary | ICD-10-CM | POA: Diagnosis not present

## 2018-10-27 DIAGNOSIS — K219 Gastro-esophageal reflux disease without esophagitis: Secondary | ICD-10-CM | POA: Diagnosis not present

## 2018-10-27 DIAGNOSIS — E039 Hypothyroidism, unspecified: Secondary | ICD-10-CM | POA: Diagnosis not present

## 2018-10-27 DIAGNOSIS — E7800 Pure hypercholesterolemia, unspecified: Secondary | ICD-10-CM | POA: Diagnosis not present

## 2018-10-27 DIAGNOSIS — E850 Non-neuropathic heredofamilial amyloidosis: Secondary | ICD-10-CM | POA: Diagnosis not present

## 2018-10-27 DIAGNOSIS — E042 Nontoxic multinodular goiter: Secondary | ICD-10-CM | POA: Diagnosis not present

## 2018-10-27 DIAGNOSIS — G629 Polyneuropathy, unspecified: Secondary | ICD-10-CM | POA: Diagnosis not present

## 2018-10-27 DIAGNOSIS — I482 Chronic atrial fibrillation, unspecified: Secondary | ICD-10-CM | POA: Diagnosis not present

## 2018-10-27 DIAGNOSIS — R21 Rash and other nonspecific skin eruption: Secondary | ICD-10-CM | POA: Diagnosis not present

## 2018-12-09 DIAGNOSIS — Z79899 Other long term (current) drug therapy: Secondary | ICD-10-CM | POA: Diagnosis not present

## 2018-12-09 DIAGNOSIS — G5622 Lesion of ulnar nerve, left upper limb: Secondary | ICD-10-CM | POA: Diagnosis not present

## 2018-12-09 DIAGNOSIS — G5603 Carpal tunnel syndrome, bilateral upper limbs: Secondary | ICD-10-CM | POA: Diagnosis not present

## 2018-12-09 DIAGNOSIS — M797 Fibromyalgia: Secondary | ICD-10-CM | POA: Diagnosis not present

## 2018-12-09 DIAGNOSIS — G2581 Restless legs syndrome: Secondary | ICD-10-CM | POA: Diagnosis not present

## 2018-12-09 DIAGNOSIS — G603 Idiopathic progressive neuropathy: Secondary | ICD-10-CM | POA: Diagnosis not present

## 2018-12-09 DIAGNOSIS — M5417 Radiculopathy, lumbosacral region: Secondary | ICD-10-CM | POA: Diagnosis not present

## 2019-01-06 DIAGNOSIS — M797 Fibromyalgia: Secondary | ICD-10-CM | POA: Diagnosis not present

## 2019-01-06 DIAGNOSIS — M5442 Lumbago with sciatica, left side: Secondary | ICD-10-CM | POA: Diagnosis not present

## 2019-01-06 DIAGNOSIS — Z79899 Other long term (current) drug therapy: Secondary | ICD-10-CM | POA: Diagnosis not present

## 2019-01-06 DIAGNOSIS — G603 Idiopathic progressive neuropathy: Secondary | ICD-10-CM | POA: Diagnosis not present

## 2019-01-06 DIAGNOSIS — G2581 Restless legs syndrome: Secondary | ICD-10-CM | POA: Diagnosis not present

## 2019-01-06 DIAGNOSIS — M5441 Lumbago with sciatica, right side: Secondary | ICD-10-CM | POA: Diagnosis not present

## 2019-01-21 DIAGNOSIS — Z974 Presence of external hearing-aid: Secondary | ICD-10-CM | POA: Diagnosis not present

## 2019-01-21 DIAGNOSIS — H7292 Unspecified perforation of tympanic membrane, left ear: Secondary | ICD-10-CM | POA: Diagnosis not present

## 2019-01-21 DIAGNOSIS — H60391 Other infective otitis externa, right ear: Secondary | ICD-10-CM | POA: Diagnosis not present

## 2019-01-21 DIAGNOSIS — H938X2 Other specified disorders of left ear: Secondary | ICD-10-CM | POA: Diagnosis not present

## 2019-01-21 DIAGNOSIS — Z9089 Acquired absence of other organs: Secondary | ICD-10-CM | POA: Diagnosis not present

## 2019-02-03 DIAGNOSIS — Z79899 Other long term (current) drug therapy: Secondary | ICD-10-CM | POA: Diagnosis not present

## 2019-02-03 DIAGNOSIS — G2581 Restless legs syndrome: Secondary | ICD-10-CM | POA: Diagnosis not present

## 2019-02-03 DIAGNOSIS — M545 Low back pain: Secondary | ICD-10-CM | POA: Diagnosis not present

## 2019-02-03 DIAGNOSIS — M797 Fibromyalgia: Secondary | ICD-10-CM | POA: Diagnosis not present

## 2019-02-04 DIAGNOSIS — Z974 Presence of external hearing-aid: Secondary | ICD-10-CM | POA: Diagnosis not present

## 2019-02-04 DIAGNOSIS — H60391 Other infective otitis externa, right ear: Secondary | ICD-10-CM | POA: Diagnosis not present

## 2019-02-04 DIAGNOSIS — H7292 Unspecified perforation of tympanic membrane, left ear: Secondary | ICD-10-CM | POA: Diagnosis not present

## 2019-03-03 DIAGNOSIS — G2581 Restless legs syndrome: Secondary | ICD-10-CM | POA: Diagnosis not present

## 2019-03-03 DIAGNOSIS — R609 Edema, unspecified: Secondary | ICD-10-CM | POA: Diagnosis not present

## 2019-03-03 DIAGNOSIS — Z79899 Other long term (current) drug therapy: Secondary | ICD-10-CM | POA: Diagnosis not present

## 2019-03-03 DIAGNOSIS — G603 Idiopathic progressive neuropathy: Secondary | ICD-10-CM | POA: Diagnosis not present

## 2019-03-03 DIAGNOSIS — M545 Low back pain: Secondary | ICD-10-CM | POA: Diagnosis not present

## 2019-03-10 DIAGNOSIS — E042 Nontoxic multinodular goiter: Secondary | ICD-10-CM | POA: Diagnosis not present

## 2019-03-10 DIAGNOSIS — Z86718 Personal history of other venous thrombosis and embolism: Secondary | ICD-10-CM | POA: Diagnosis not present

## 2019-03-10 DIAGNOSIS — E7800 Pure hypercholesterolemia, unspecified: Secondary | ICD-10-CM | POA: Diagnosis not present

## 2019-03-10 DIAGNOSIS — G629 Polyneuropathy, unspecified: Secondary | ICD-10-CM | POA: Diagnosis not present

## 2019-03-10 DIAGNOSIS — K219 Gastro-esophageal reflux disease without esophagitis: Secondary | ICD-10-CM | POA: Diagnosis not present

## 2019-03-10 DIAGNOSIS — E039 Hypothyroidism, unspecified: Secondary | ICD-10-CM | POA: Diagnosis not present

## 2019-03-10 DIAGNOSIS — Z23 Encounter for immunization: Secondary | ICD-10-CM | POA: Diagnosis not present

## 2019-03-10 DIAGNOSIS — I4891 Unspecified atrial fibrillation: Secondary | ICD-10-CM | POA: Diagnosis not present

## 2019-03-10 DIAGNOSIS — I10 Essential (primary) hypertension: Secondary | ICD-10-CM | POA: Diagnosis not present

## 2019-03-10 DIAGNOSIS — E850 Non-neuropathic heredofamilial amyloidosis: Secondary | ICD-10-CM | POA: Diagnosis not present

## 2019-03-10 DIAGNOSIS — Z0000 Encounter for general adult medical examination without abnormal findings: Secondary | ICD-10-CM | POA: Diagnosis not present

## 2019-03-11 DIAGNOSIS — Z1211 Encounter for screening for malignant neoplasm of colon: Secondary | ICD-10-CM | POA: Diagnosis not present

## 2019-04-25 DIAGNOSIS — H7292 Unspecified perforation of tympanic membrane, left ear: Secondary | ICD-10-CM | POA: Diagnosis not present

## 2019-04-25 DIAGNOSIS — H6642 Suppurative otitis media, unspecified, left ear: Secondary | ICD-10-CM | POA: Diagnosis not present

## 2019-04-25 DIAGNOSIS — H6061 Unspecified chronic otitis externa, right ear: Secondary | ICD-10-CM | POA: Diagnosis not present

## 2019-04-25 DIAGNOSIS — Z974 Presence of external hearing-aid: Secondary | ICD-10-CM | POA: Diagnosis not present

## 2019-05-09 DIAGNOSIS — H7292 Unspecified perforation of tympanic membrane, left ear: Secondary | ICD-10-CM | POA: Diagnosis not present

## 2019-05-11 ENCOUNTER — Telehealth: Payer: Self-pay

## 2019-05-11 NOTE — Telephone Encounter (Signed)
She has a whole list of stains on her allergy list, she cannot take them, and does not want to , zetia is on there as well

## 2019-05-11 NOTE — Telephone Encounter (Signed)
Okay, could consider Zetia or Nexlitol, which are not statins. She can discuss with [place] or she can discuss with Dr. Olen Pel

## 2019-05-11 NOTE — Telephone Encounter (Signed)
Pt called to let us know that she is no longer taking the Livalo due to side effects and also her having had some illness that requires her to take colchicine.

## 2019-05-11 NOTE — Telephone Encounter (Signed)
Pt states she has Familial Mediterranean Fever and should not take statins.

## 2019-05-12 NOTE — Telephone Encounter (Signed)
Pt is going to think about it let us know and she will speak with her PCP as well.

## 2019-08-16 DIAGNOSIS — H60331 Swimmer's ear, right ear: Secondary | ICD-10-CM | POA: Diagnosis not present

## 2019-08-23 DIAGNOSIS — M25562 Pain in left knee: Secondary | ICD-10-CM | POA: Diagnosis not present

## 2019-08-23 DIAGNOSIS — M1712 Unilateral primary osteoarthritis, left knee: Secondary | ICD-10-CM | POA: Diagnosis not present

## 2019-09-27 DIAGNOSIS — M25562 Pain in left knee: Secondary | ICD-10-CM | POA: Diagnosis not present

## 2019-09-27 DIAGNOSIS — M1712 Unilateral primary osteoarthritis, left knee: Secondary | ICD-10-CM | POA: Diagnosis not present

## 2019-10-08 DIAGNOSIS — M25561 Pain in right knee: Secondary | ICD-10-CM | POA: Diagnosis not present

## 2019-10-13 DIAGNOSIS — M25562 Pain in left knee: Secondary | ICD-10-CM | POA: Diagnosis not present

## 2019-10-13 DIAGNOSIS — M1712 Unilateral primary osteoarthritis, left knee: Secondary | ICD-10-CM | POA: Diagnosis not present

## 2019-10-18 DIAGNOSIS — G8929 Other chronic pain: Secondary | ICD-10-CM | POA: Diagnosis not present

## 2019-10-18 DIAGNOSIS — E785 Hyperlipidemia, unspecified: Secondary | ICD-10-CM | POA: Diagnosis not present

## 2019-10-18 DIAGNOSIS — K5900 Constipation, unspecified: Secondary | ICD-10-CM | POA: Diagnosis not present

## 2019-10-18 DIAGNOSIS — G629 Polyneuropathy, unspecified: Secondary | ICD-10-CM | POA: Diagnosis not present

## 2019-10-18 DIAGNOSIS — I10 Essential (primary) hypertension: Secondary | ICD-10-CM | POA: Diagnosis not present

## 2019-10-18 DIAGNOSIS — R32 Unspecified urinary incontinence: Secondary | ICD-10-CM | POA: Diagnosis not present

## 2019-10-18 DIAGNOSIS — R269 Unspecified abnormalities of gait and mobility: Secondary | ICD-10-CM | POA: Diagnosis not present

## 2019-10-18 DIAGNOSIS — A239 Brucellosis, unspecified: Secondary | ICD-10-CM | POA: Diagnosis not present

## 2019-10-18 DIAGNOSIS — E039 Hypothyroidism, unspecified: Secondary | ICD-10-CM | POA: Diagnosis not present

## 2019-11-10 ENCOUNTER — Ambulatory Visit: Payer: Medicare Other | Admitting: Cardiology

## 2019-11-10 ENCOUNTER — Other Ambulatory Visit: Payer: Self-pay

## 2019-11-10 ENCOUNTER — Encounter: Payer: Self-pay | Admitting: Cardiology

## 2019-11-10 VITALS — BP 137/58 | HR 70 | Ht 70.0 in | Wt 233.0 lb

## 2019-11-10 DIAGNOSIS — Z86711 Personal history of pulmonary embolism: Secondary | ICD-10-CM

## 2019-11-10 DIAGNOSIS — I10 Essential (primary) hypertension: Secondary | ICD-10-CM

## 2019-11-10 DIAGNOSIS — Z01818 Encounter for other preprocedural examination: Secondary | ICD-10-CM | POA: Diagnosis not present

## 2019-11-10 DIAGNOSIS — Z6833 Body mass index (BMI) 33.0-33.9, adult: Secondary | ICD-10-CM | POA: Diagnosis not present

## 2019-11-10 DIAGNOSIS — E782 Mixed hyperlipidemia: Secondary | ICD-10-CM | POA: Diagnosis not present

## 2019-11-10 DIAGNOSIS — I4510 Unspecified right bundle-branch block: Secondary | ICD-10-CM

## 2019-11-10 DIAGNOSIS — E6609 Other obesity due to excess calories: Secondary | ICD-10-CM

## 2019-11-10 DIAGNOSIS — Z86718 Personal history of other venous thrombosis and embolism: Secondary | ICD-10-CM | POA: Diagnosis not present

## 2019-11-10 DIAGNOSIS — Z01810 Encounter for preprocedural cardiovascular examination: Secondary | ICD-10-CM

## 2019-11-10 NOTE — Progress Notes (Signed)
[name] Date of Birth: [birth year] MRN: [id number] Primary Care Provider:Meyers, Annie Main, MD Former Cardiology Providers: Sinclair Grooms, MD, Sueanne Margarita, MD, Jeri Lager, APRN, FNP-C Primary Cardiologist: Rex Kras, DO, [facility] (established care 11/10/2019)  Date: 11/10/19 Last Visit: 08/12/2018  Chief Complaint  Patient presents with  . Pre-op Exam    HPI  Bernarda A Wallis is a 86 y.o.  female who presents to the office with a chief complaint of " preop examination." Patient's past medical history and cardiovascular risk factors include: hypertension, hypothyroidism, prediabetes, diabetic neuropathy, familial Mediterranean fever, history of DVT, history of PE.   Patient was last seen in the office back in March 2020 by Miquel Dunn for management of hyperlipidemia. She has not been able to tolerate other statins including Lipitor, Crestor, Zocor due to myalgia symptoms.  Statin has drug to drug interaction with many statin drugs and colchicine which she takes for Mediterranean fever.  Since last office visit patient has not been hospitalized or seen in urgent care for cardiovascular symptoms.  Patient states that she is being scheduled for Left knee arthroscopy and debridement with Dr. Dorna Leitz.  Patient does not know any additional details regarding her surgery at the current time.  The date of the surgery still needs to be determined after preop risk stratification is complete.  Currently patient does not have any chest pain or anginal equivalent. No recent congestive heart failure. No recent acute coronary syndromes. Patient is prediabetic. History of DVT and PE after surgery in the past and currently on oral anticoagulation. Patient is overall functional status is limited as she walks with a cane but is able to maintain her home and do some gardening.  Denies prior history of coronary artery disease, myocardial infarction, congestive heart failure,stroke, transient  ischemic attack.   FUNCTIONAL STATUS: She able to garden and maintain her home. No structured exercise program or daily routine.    ALLERGIES: Allergies  Allergen Reactions  . Dilaudid [Hydromorphone Hcl] Other (See Comments)    Flushing, hallucinations  . Gabapentin Other (See Comments)    RLS with burning  . Lipitor [Atorvastatin Calcium] Other (See Comments)    Muscle aches   . Lovastatin Other (See Comments)    Muscle pain  . Zetia [Ezetimibe] Other (See Comments)    Muscles aches  . Zocor [Simvastatin] Other (See Comments)    Muscle Aches     MEDICATION LIST PRIOR TO VISIT: Current Outpatient Medications on File Prior to Visit  Medication Sig Dispense Refill  . acetaminophen (TYLENOL) 650 MG CR tablet Take 1,300 mg by mouth every 8 (eight) hours as needed for pain.    Marland Kitchen atenolol (TENORMIN) 25 MG tablet Take 25 mg by mouth daily.    . colchicine 0.6 MG tablet Take 0.6 mg by mouth daily.    Marland Kitchen docusate sodium (COLACE) 100 MG capsule Take 1 capsule (100 mg total) by mouth 2 (two) times daily. (Patient taking differently: Take 100 mg by mouth 2 (two) times daily as needed. ) 30 capsule 0  . furosemide (LASIX) 20 MG tablet Take 20 mg by mouth daily.     Marland Kitchen gabapentin (NEURONTIN) 300 MG capsule Take 300 mg by mouth 3 (three) times daily.    Marland Kitchen levothyroxine (SYNTHROID) 50 MCG tablet Take 1 tablet by mouth daily.    Marland Kitchen levothyroxine (SYNTHROID, LEVOTHROID) 75 MCG tablet Take 75 mcg by mouth daily before breakfast.     . Multiple Vitamins-Minerals (CENTRUM SILVER PO) Take  1 tablet by mouth daily.    . Omega-3 Fatty Acids (OMEGA-3 1450 PO) Take by mouth.    . Pitavastatin Calcium (LIVALO) 2 MG TABS Take 1 tablet (2 mg total) by mouth daily. 90 tablet 3  . rivaroxaban (XARELTO) 20 MG TABS tablet Take 1 tablet (20 mg total) by mouth daily with supper. 30 tablet 3   No current facility-administered medications on file prior to visit.    PAST MEDICAL HISTORY: Past Medical History:   Diagnosis Date  . Borderline diabetes   . Chronic back pain   . Chronic lower back pain   . Clotting disorder ([place])   . Deep vein thrombosis (DVT) ([place])   . DVT, lower extremity ([place]) 01/2002; 06/2003   RLE; LLE  . Enlarged thyroid gland   . Familial Mediterranean fever ([place])   . Fibromyalgia   . Fibromyalgia   . Gallstones   . GERD (gastroesophageal reflux disease)   . GERD (gastroesophageal reflux disease)   . H/O hiatal hernia   . History of stomach ulcers   . Hyperlipidemia   . Hypertension   . Hypothyroidism   . Insomnia   . Migraine    "I've had 4; last time was when I was in my ?30's"  . Osteoarthritis   . Phlebitis    LLE  . Pneumonia   . Pulmonary embolism ([place]) 2003  . Shortness of breath on exertion   . Tinnitus     PAST SURGICAL HISTORY: Past Surgical History:  Procedure Laterality Date  . ABDOMINAL HYSTERECTOMY  1972  . APPENDECTOMY  1960  . bladder tack    . BREAST BIOPSY     left  . [place]   right  . CATARACT EXTRACTION, BILATERAL  ~ 2011  . CHOLECYSTECTOMY  09/12/2011   Procedure: LAPAROSCOPIC CHOLECYSTECTOMY WITH INTRAOPERATIVE CHOLANGIOGRAM;  Surgeon: Shann Medal, MD;  Location: [place];  Service: General;  Laterality: N/A;  . excision of goiter    . EXTERNAL EAR SURGERY     x3  . HERNIA REPAIR  1970's   ventral  . MASS EXCISION  12/2001   paravertebral chest mass  . [place]; 1982; after 1982  . mini thoracotomy  12/2001  . THORACOTOMY  2003   procedure done   to remove growth in lung   . TOTAL KNEE ARTHROPLASTY Right 01/30/2017   Procedure: RIGHT TOTAL KNEE ARTHROPLASTY;  Surgeon: Dorna Leitz, MD;  Location: [facility];  Service: Orthopedics;  Laterality: Right;  With adductor canal block    FAMILY HISTORY: The patient's family history includes Heart disease in her mother; Hyperlipidemia in her mother; Hypertension in her mother; Stroke in her brother and father.   SOCIAL HISTORY:  The patient  reports  that she has never smoked. She has never used smokeless tobacco. She reports that she does not drink alcohol or use drugs.  Review of Systems  Constitution: Negative for chills and fever.  HENT: Negative for hoarse voice and nosebleeds.   Eyes: Negative for discharge, double vision and pain.  Cardiovascular: Positive for leg swelling. Negative for chest pain, claudication, dyspnea on exertion, near-syncope, orthopnea, palpitations, paroxysmal nocturnal dyspnea and syncope.  Respiratory: Negative for hemoptysis and shortness of breath.   Musculoskeletal: Positive for joint pain. Negative for muscle cramps and myalgias.  Gastrointestinal: Negative for abdominal pain, constipation, diarrhea, hematemesis, hematochezia, melena, nausea and vomiting.  Neurological: Negative for dizziness and light-headedness.    PHYSICAL EXAM: Vitals  with BMI 11/10/2019 08/12/2018 05/13/2018  Height 5\' 10"  5\' 10"  5\' 10"   Weight 233 lbs 255 lbs 6 oz 248 lbs 6 oz  BMI 33.43 [id number]  Systolic 0000000 [id number]  Diastolic 58 69 64  Pulse 70 68 80    CONSTITUTIONAL: Well-developed and well-nourished. No acute distress.  Ambulates with a cane SKIN: Skin is warm and dry. No rash noted. No cyanosis. No pallor. No jaundice HEAD: Normocephalic and atraumatic.  EYES: No scleral icterus MOUTH/THROAT: Moist oral membranes.  NECK: No JVD present. No thyromegaly noted. No carotid bruits  LYMPHATIC: No visible cervical adenopathy.  CHEST Normal respiratory effort. No intercostal retractions  LUNGS: Clear to auscultation bilaterally.  No stridor. No wheezes. No rales.  CARDIOVASCULAR: Regular rate and rhythm, positive S1-S2, no murmurs rubs or gallops appreciated ABDOMINAL: .Obese, soft, nontender, nondistended, positive bowel sounds all 4 quadrants.No apparent ascites.  EXTREMITIES: Bilateral +1 peripheral edema, warm to touch, skin changes suggestive of chronic venous stasis. HEMATOLOGIC: No significant bruising NEUROLOGIC:  Oriented to person, place, and time. Nonfocal. Normal muscle tone.  PSYCHIATRIC: Normal mood and affect. Normal behavior. Cooperative  CARDIAC DATABASE: EKG: 11/10/2019: Normal sinus rhythm, 71 bpm, right bundle branch block, ST-T changes most likely secondary to RBBB, without underlying ischemia or injury pattern.    Echocardiogram: 09/2011: LVEF 55-60% mildly dilated left atrium, PASP 33 mmHg.  Stress Testing:  More than 5 years.   Heart Catheterization: None  LABORATORY DATA: CBC Latest Ref Rng & Units 02/02/2017 02/01/2017 01/31/2017  WBC 4.0 - 10.5 K/uL 7.9 13.6(H) 12.4(H)  Hemoglobin 12.0 - 15.0 g/dL 11.8(L) 12.4 12.3  Hematocrit 36.0 - 46.0 % 35.4(L) 37.9 37.5  Platelets 150 - 400 K/uL 123(L) 137(L) 122(L)    CMP Latest Ref Rng & Units 01/31/2017 01/23/2017 01/13/2016  Glucose 65 - 99 mg/dL 194(H) 121(H) 114(H)  BUN 6 - 20 mg/dL 12 27(H) 5(L)  Creatinine 0.44 - 1.00 mg/dL 0.77 1.31(H) 0.77  Sodium 135 - 145 mmol/L 136 145 142  Potassium 3.5 - 5.1 mmol/L 4.3 5.5(H) 4.1  Chloride 101 - 111 mmol/L 105 110 108  CO2 22 - 32 mmol/L 26 26 28   Calcium 8.9 - 10.3 mg/dL 8.8(L) 9.8 8.8(L)  Total Protein 6.5 - 8.1 g/dL - 7.4 -  Total Bilirubin 0.3 - 1.2 mg/dL - 0.9 -  Alkaline Phos 38 - 126 U/L - 55 -  AST 15 - 41 U/L - 25 -  ALT 14 - 54 U/L - 12(L) -    Lipid Panel     Component Value Date/Time   CHOL 180 07/12/2018 1251   TRIG 130 07/12/2018 1251   HDL 62 07/12/2018 1251   LDLCALC 92 07/12/2018 1251   LABVLDL 26 07/12/2018 1251    Lab Results  Component Value Date   HGBA1C 5.8 (H) 01/23/2017   No components found for: NTPROBNP No results found for: TSH  Cardiac Panel (last 3 results) No results for input(s): CKTOTAL, CKMB, TROPONINIHS, RELINDX in the last 72 hours.  IMPRESSION:    ICD-10-CM   1. Preop cardiovascular exam  Z01.810 EKG [id number]    Basic Metabolic Panel (BMET)    Magnesium    PCV ECHOCARDIOGRAM COMPLETE    PCV MYOCARDIAL PERFUSION WITH LEXISCAN  2.  Mixed hyperlipidemia  E78.2   3. Right bundle branch block  I45.10 PCV MYOCARDIAL PERFUSION WITH LEXISCAN  4. Essential hypertension  I10   5. Hx pulmonary embolism  Z86.711   6. Hx of deep venous thrombosis  Z86.718   7. Class 1 obesity due to excess calories without serious comorbidity with body mass index (BMI) of 33.0 to 33.9 in adult  E66.09    Z68.33      RECOMMENDATIONS: MOSES CARLAND is a 86 y.o. female whose past medical history and cardiovascular risk factors include: hypertension, hypothyroidism, prediabetes, diabetic neuropathy, familial Mediterranean fever, history of DVT, history of PE.   Preoperative risk stratification:  EKG shows normal sinus rhythm with a right bundle branch block without underlying ischemia or injury pattern.  Patient's functional status is limited as she walks with a cane.  No recent ischemic evaluation has been performed.  Echocardiogram will be ordered to evaluate for structural heart disease and left ventricular systolic function.  Nuclear stress test recommended to evaluate for reversible ischemia.  Plan BMP and magnesium level to evaluate kidney function prior to upcoming surgery  Preoperative risk stratification forthcoming.   Mixed hyperlipidemia: Most recent lipid profile reviewed.  Continue Livalo.  Patient does not endorse myalgias.  Right bundle branch block: Patient was noted to have a new right bundle branch block in the past at that time she was recommended to undergo stress testing which she had refused.  Continue to monitor.  History of pulmonary embolism and deep venous thrombosis after surgery: Currently on oral anticoagulation.  Obesity, due to excess calories: Body mass index is 33.43 kg/m. . I reviewed with the patient the importance of diet, regular physical activity/exercise, weight loss.   . Patient is educated on increasing physical activity gradually as tolerated.  With the goal of moderate intensity exercise for 30  minutes a day 5 days a week.  FINAL MEDICATION LIST END OF ENCOUNTER: No orders of the defined types were placed in this encounter.   Medications Discontinued During This Encounter  Medication Reason  . Pitavastatin Calcium 2 MG TABS Completed Course  . pregabalin (LYRICA) 50 MG capsule Completed Course  . omeprazole (PRILOSEC) 20 MG capsule Completed Course     Current Outpatient Medications:  .  acetaminophen (TYLENOL) 650 MG CR tablet, Take 1,300 mg by mouth every 8 (eight) hours as needed for pain., Disp: , Rfl:  .  atenolol (TENORMIN) 25 MG tablet, Take 25 mg by mouth daily., Disp: , Rfl:  .  colchicine 0.6 MG tablet, Take 0.6 mg by mouth daily., Disp: , Rfl:  .  docusate sodium (COLACE) 100 MG capsule, Take 1 capsule (100 mg total) by mouth 2 (two) times daily. (Patient taking differently: Take 100 mg by mouth 2 (two) times daily as needed. ), Disp: 30 capsule, Rfl: 0 .  furosemide (LASIX) 20 MG tablet, Take 20 mg by mouth daily. , Disp: , Rfl:  .  gabapentin (NEURONTIN) 300 MG capsule, Take 300 mg by mouth 3 (three) times daily., Disp: , Rfl:  .  levothyroxine (SYNTHROID) 50 MCG tablet, Take 1 tablet by mouth daily., Disp: , Rfl:  .  levothyroxine (SYNTHROID, LEVOTHROID) 75 MCG tablet, Take 75 mcg by mouth daily before breakfast. , Disp: , Rfl:  .  Multiple Vitamins-Minerals (CENTRUM SILVER PO), Take 1 tablet by mouth daily., Disp: , Rfl:  .  Omega-3 Fatty Acids (OMEGA-3 1450 PO), Take by mouth., Disp: , Rfl:  .  Pitavastatin Calcium (LIVALO) 2 MG TABS, Take 1 tablet (2 mg total) by mouth daily., Disp: 90 tablet, Rfl: 3 .  rivaroxaban (XARELTO) 20 MG TABS tablet, Take 1 tablet (20 mg total) by mouth daily with supper., Disp: 30 tablet, Rfl: 3  Orders Placed This Encounter  Procedures  . Basic Metabolic Panel (BMET)  . Magnesium  . PCV MYOCARDIAL PERFUSION WITH LEXISCAN  . EKG 12-Lead  . PCV ECHOCARDIOGRAM COMPLETE     --Continue cardiac medications as reconciled in final  medication list. --Return in about 6 months (around 05/11/2020) for review test results.. Or sooner if needed. --Continue follow-up with your primary care physician regarding the management of your other chronic comorbid conditions.  Patient's questions and concerns were addressed to her satisfaction. She voices understanding of the instructions provided during this encounter.   During this visit I reviewed and updated: Tobacco history  allergies medication reconciliation  medical history  surgical history  family history  social history.  This note was created using a voice recognition software as a result there may be grammatical errors inadvertently enclosed that do not reflect the nature of this encounter. Every attempt is made to correct such errors.  Rex Kras, [place], [facility]  Pager: [phone] Office: [phone]

## 2019-11-11 DIAGNOSIS — Z01818 Encounter for other preprocedural examination: Secondary | ICD-10-CM | POA: Diagnosis not present

## 2019-11-12 LAB — MAGNESIUM: Magnesium: 2.1 mg/dL (ref 1.6–2.3)

## 2019-11-12 LAB — BASIC METABOLIC PANEL
BUN/Creatinine Ratio: 20 (ref 12–28)
BUN: 16 mg/dL (ref 8–27)
CO2: 22 mmol/L (ref 20–29)
Calcium: 9.6 mg/dL (ref 8.7–10.3)
Chloride: 103 mmol/L (ref 96–106)
Creatinine, Ser: 0.81 mg/dL (ref 0.57–1.00)
GFR calc Af Amer: 76 mL/min/{1.73_m2} (ref >59)
GFR calc non Af Amer: 66 mL/min/{1.73_m2} (ref >59)
Glucose: 102 mg/dL — ABNORMAL HIGH (ref 65–99)
Potassium: 4.3 mmol/L (ref 3.5–5.2)
Sodium: 144 mmol/L (ref 134–144)

## 2019-11-14 ENCOUNTER — Ambulatory Visit: Payer: Medicare HMO

## 2019-11-14 ENCOUNTER — Other Ambulatory Visit: Payer: Self-pay

## 2019-11-14 DIAGNOSIS — Z01810 Encounter for preprocedural cardiovascular examination: Secondary | ICD-10-CM

## 2019-11-23 ENCOUNTER — Ambulatory Visit: Payer: Medicare HMO

## 2019-11-23 ENCOUNTER — Other Ambulatory Visit: Payer: Self-pay

## 2019-11-23 DIAGNOSIS — Z01810 Encounter for preprocedural cardiovascular examination: Secondary | ICD-10-CM | POA: Diagnosis not present

## 2019-11-23 DIAGNOSIS — I4510 Unspecified right bundle-branch block: Secondary | ICD-10-CM

## 2019-11-25 ENCOUNTER — Telehealth: Payer: Self-pay

## 2019-11-25 NOTE — Telephone Encounter (Signed)
Debbie Richards called to check on surgical clearance. Per ST, will be ready by Monday. Pt is having left knee scope and debreavement. Anesthesia has not been chosen yet and surgery date is pending clearance.

## 2019-11-27 ENCOUNTER — Encounter: Payer: Self-pay | Admitting: Cardiology

## 2019-11-28 ENCOUNTER — Encounter: Payer: Self-pay | Admitting: Cardiology

## 2019-12-02 ENCOUNTER — Other Ambulatory Visit (HOSPITAL_COMMUNITY): Payer: Medicare Other

## 2019-12-02 ENCOUNTER — Other Ambulatory Visit: Payer: Self-pay | Admitting: Orthopedic Surgery

## 2019-12-02 ENCOUNTER — Other Ambulatory Visit: Payer: Self-pay

## 2019-12-02 ENCOUNTER — Encounter (HOSPITAL_BASED_OUTPATIENT_CLINIC_OR_DEPARTMENT_OTHER): Payer: Self-pay | Admitting: Orthopedic Surgery

## 2019-12-02 ENCOUNTER — Telehealth: Payer: Self-pay

## 2019-12-02 ENCOUNTER — Other Ambulatory Visit (HOSPITAL_COMMUNITY)
Admission: RE | Admit: 2019-12-02 | Discharge: 2019-12-02 | Disposition: A | Discharge status: Home / Self Care | Payer: Medicare HMO | Source: Ambulatory Visit | Attending: Orthopedic Surgery | Admitting: Orthopedic Surgery

## 2019-12-02 DIAGNOSIS — Z01812 Encounter for preprocedural laboratory examination: Secondary | ICD-10-CM | POA: Diagnosis present

## 2019-12-02 DIAGNOSIS — Z20822 Contact with and (suspected) exposure to covid-19: Secondary | ICD-10-CM | POA: Diagnosis not present

## 2019-12-02 LAB — SARS CORONAVIRUS 2 (TAT 6-24 HRS): SARS Coronavirus 2: NEGATIVE

## 2019-12-02 NOTE — Telephone Encounter (Signed)
error 

## 2019-12-02 NOTE — Telephone Encounter (Signed)
Daughter called and wants results of her stress test.

## 2019-12-02 NOTE — Progress Notes (Signed)
OK to use lab results from 11/11/19 for surgery on 12/05/19 per Dr. Johnette Abraham. [place]/ Tammy Jimmye Norman RN.

## 2019-12-02 NOTE — Progress Notes (Signed)
Patient's chart and recent cardiac tests reviewed with Dr Ola Spurr, [place] [facility].

## 2019-12-02 NOTE — Telephone Encounter (Addendum)
Spoke to the patient's daughter regarding the echo and stress test findings.  Informed her that patient would be considered low to moderate risk for the upcoming non-cardiac surgery but it is non-modifiable due to her age and co-morbid conditions. Ms. Debbie Richards finds this risk stratification acceptable and is agreeable.

## 2019-12-05 ENCOUNTER — Encounter (HOSPITAL_BASED_OUTPATIENT_CLINIC_OR_DEPARTMENT_OTHER): Payer: Self-pay | Admitting: Orthopedic Surgery

## 2019-12-05 ENCOUNTER — Other Ambulatory Visit: Payer: Self-pay

## 2019-12-05 ENCOUNTER — Ambulatory Visit (HOSPITAL_BASED_OUTPATIENT_CLINIC_OR_DEPARTMENT_OTHER)
Admission: RE | Admit: 2019-12-05 | Discharge: 2019-12-05 | Disposition: A | Discharge status: Home / Self Care | Payer: Medicare HMO | Attending: Orthopedic Surgery | Admitting: Orthopedic Surgery

## 2019-12-05 ENCOUNTER — Ambulatory Visit (HOSPITAL_BASED_OUTPATIENT_CLINIC_OR_DEPARTMENT_OTHER): Payer: Medicare HMO | Admitting: Anesthesiology

## 2019-12-05 ENCOUNTER — Encounter (HOSPITAL_BASED_OUTPATIENT_CLINIC_OR_DEPARTMENT_OTHER)
Admission: RE | Disposition: A | Discharge status: Home / Self Care | Payer: Self-pay | Source: Home / Self Care | Attending: Orthopedic Surgery

## 2019-12-05 DIAGNOSIS — M1712 Unilateral primary osteoarthritis, left knee: Secondary | ICD-10-CM | POA: Insufficient documentation

## 2019-12-05 DIAGNOSIS — Z96651 Presence of right artificial knee joint: Secondary | ICD-10-CM | POA: Insufficient documentation

## 2019-12-05 DIAGNOSIS — K219 Gastro-esophageal reflux disease without esophagitis: Secondary | ICD-10-CM | POA: Diagnosis not present

## 2019-12-05 DIAGNOSIS — Z79899 Other long term (current) drug therapy: Secondary | ICD-10-CM | POA: Diagnosis not present

## 2019-12-05 DIAGNOSIS — E785 Hyperlipidemia, unspecified: Secondary | ICD-10-CM | POA: Insufficient documentation

## 2019-12-05 DIAGNOSIS — I10 Essential (primary) hypertension: Secondary | ICD-10-CM | POA: Insufficient documentation

## 2019-12-05 DIAGNOSIS — X58XXXA Exposure to other specified factors, initial encounter: Secondary | ICD-10-CM | POA: Diagnosis not present

## 2019-12-05 DIAGNOSIS — Z86718 Personal history of other venous thrombosis and embolism: Secondary | ICD-10-CM | POA: Insufficient documentation

## 2019-12-05 DIAGNOSIS — M94262 Chondromalacia, left knee: Secondary | ICD-10-CM | POA: Diagnosis not present

## 2019-12-05 DIAGNOSIS — S83282A Other tear of lateral meniscus, current injury, left knee, initial encounter: Secondary | ICD-10-CM | POA: Diagnosis not present

## 2019-12-05 DIAGNOSIS — Z86711 Personal history of pulmonary embolism: Secondary | ICD-10-CM | POA: Diagnosis not present

## 2019-12-05 DIAGNOSIS — M23322 Other meniscus derangements, posterior horn of medial meniscus, left knee: Secondary | ICD-10-CM | POA: Diagnosis not present

## 2019-12-05 DIAGNOSIS — Z7901 Long term (current) use of anticoagulants: Secondary | ICD-10-CM | POA: Insufficient documentation

## 2019-12-05 DIAGNOSIS — M23342 Other meniscus derangements, anterior horn of lateral meniscus, left knee: Secondary | ICD-10-CM | POA: Diagnosis not present

## 2019-12-05 DIAGNOSIS — E039 Hypothyroidism, unspecified: Secondary | ICD-10-CM | POA: Diagnosis not present

## 2019-12-05 DIAGNOSIS — S83242A Other tear of medial meniscus, current injury, left knee, initial encounter: Secondary | ICD-10-CM | POA: Insufficient documentation

## 2019-12-05 DIAGNOSIS — Z79890 Hormone replacement therapy (postmenopausal): Secondary | ICD-10-CM | POA: Insufficient documentation

## 2019-12-05 HISTORY — PX: KNEE ARTHROSCOPY: SHX127

## 2019-12-05 SURGERY — ARTHROSCOPY, KNEE
Anesthesia: General | Site: Knee | Laterality: Left

## 2019-12-05 MED ORDER — OXYCODONE HCL 5 MG/5ML PO SOLN: 5.0000 mg | Freq: Once | ORAL | Status: DC | PRN

## 2019-12-05 MED ORDER — BUPIVACAINE HCL (PF) 0.5 % IJ SOLN
INTRAMUSCULAR | Status: DC | PRN
  Administered 2019-12-05: 30 mL

## 2019-12-05 MED ORDER — SODIUM CHLORIDE 0.9 % IR SOLN
Status: DC | PRN
  Administered 2019-12-05: 2800 mL

## 2019-12-05 MED ORDER — LIDOCAINE HCL (CARDIAC) PF 100 MG/5ML IV SOSY
PREFILLED_SYRINGE | INTRAVENOUS | Status: DC | PRN
  Administered 2019-12-05: 100 mg via INTRAVENOUS

## 2019-12-05 MED ORDER — FENTANYL CITRATE (PF) 100 MCG/2ML IJ SOLN: 25.0000 ug | INTRAMUSCULAR | Status: DC | PRN

## 2019-12-05 MED ORDER — BUPIVACAINE HCL (PF) 0.25 % IJ SOLN
INTRAMUSCULAR | Status: AC
  Filled 2019-12-05: qty 30

## 2019-12-05 MED ORDER — ONDANSETRON HCL 4 MG/2ML IJ SOLN
INTRAMUSCULAR | Status: AC
  Filled 2019-12-05: qty 2

## 2019-12-05 MED ORDER — PROPOFOL 10 MG/ML IV BOLUS
INTRAVENOUS | Status: DC | PRN
  Administered 2019-12-05: 100 mg via INTRAVENOUS

## 2019-12-05 MED ORDER — FENTANYL CITRATE (PF) 100 MCG/2ML IJ SOLN
25.0000 ug | INTRAMUSCULAR | Status: DC | PRN
  Administered 2019-12-05: 50 ug via INTRAVENOUS

## 2019-12-05 MED ORDER — LIDOCAINE 2% (20 MG/ML) 5 ML SYRINGE
INTRAMUSCULAR | Status: AC
  Filled 2019-12-05: qty 5

## 2019-12-05 MED ORDER — OXYCODONE HCL 5 MG PO TABS: 5.0000 mg | ORAL_TABLET | Freq: Once | ORAL | Status: DC | PRN

## 2019-12-05 MED ORDER — EPINEPHRINE PF 1 MG/ML IJ SOLN
INTRAMUSCULAR | Status: AC
  Filled 2019-12-05: qty 2

## 2019-12-05 MED ORDER — LACTATED RINGERS IV SOLN: INTRAVENOUS | Status: DC

## 2019-12-05 MED ORDER — ONDANSETRON HCL 4 MG/2ML IJ SOLN
INTRAMUSCULAR | Status: DC | PRN
  Administered 2019-12-05: 4 mg via INTRAVENOUS

## 2019-12-05 MED ORDER — SUGAMMADEX SODIUM 500 MG/5ML IV SOLN
INTRAVENOUS | Status: AC
  Filled 2019-12-05: qty 5

## 2019-12-05 MED ORDER — PROMETHAZINE HCL 25 MG/ML IJ SOLN: 6.2500 mg | INTRAMUSCULAR | Status: DC | PRN

## 2019-12-05 MED ORDER — CEFAZOLIN SODIUM-DEXTROSE 2-4 GM/100ML-% IV SOLN: 2.0000 g | INTRAVENOUS | Status: DC

## 2019-12-05 MED ORDER — FENTANYL CITRATE (PF) 100 MCG/2ML IJ SOLN
INTRAMUSCULAR | Status: AC
  Filled 2019-12-05: qty 2

## 2019-12-05 MED ORDER — PROPOFOL 10 MG/ML IV BOLUS
INTRAVENOUS | Status: AC
  Filled 2019-12-05: qty 20

## 2019-12-05 MED ORDER — DEXAMETHASONE SODIUM PHOSPHATE 4 MG/ML IJ SOLN
INTRAMUSCULAR | Status: DC | PRN
  Administered 2019-12-05: 10 mg via INTRAVENOUS

## 2019-12-05 MED ORDER — BUPIVACAINE HCL (PF) 0.5 % IJ SOLN
INTRAMUSCULAR | Status: AC
  Filled 2019-12-05: qty 30

## 2019-12-05 MED ORDER — ONDANSETRON HCL 4 MG/2ML IJ SOLN: 4.0000 mg | Freq: Once | INTRAMUSCULAR | Status: DC | PRN

## 2019-12-05 MED ORDER — CEFAZOLIN SODIUM-DEXTROSE 2-4 GM/100ML-% IV SOLN
INTRAVENOUS | Status: AC
  Filled 2019-12-05: qty 100

## 2019-12-05 MED ORDER — FENTANYL CITRATE (PF) 100 MCG/2ML IJ SOLN
INTRAMUSCULAR | Status: DC | PRN
  Administered 2019-12-05: 50 ug via INTRAVENOUS

## 2019-12-05 SURGICAL SUPPLY — 33 items
BLADE EXCALIBUR 4.0X13 (MISCELLANEOUS) ×2 IMPLANT
BNDG ELASTIC 6X5.8 VLCR STR LF (GAUZE/BANDAGES/DRESSINGS) ×2 IMPLANT
COVER WAND RF STERILE (DRAPES) IMPLANT
DISSECTOR 4.0MM X 13CM (MISCELLANEOUS) IMPLANT
DRAPE ARTHROSCOPY W/POUCH 90 (DRAPES) ×2 IMPLANT
DRSG EMULSION OIL 3X3 NADH (GAUZE/BANDAGES/DRESSINGS) ×2 IMPLANT
DURAPREP 26ML APPLICATOR (WOUND CARE) ×2 IMPLANT
ELECT MENISCUS 165MM 90D (ELECTRODE) IMPLANT
ELECT REM PT RETURN 9FT ADLT (ELECTROSURGICAL)
ELECTRODE REM PT RTRN 9FT ADLT (ELECTROSURGICAL) IMPLANT
GAUZE SPONGE 4X4 12PLY STRL (GAUZE/BANDAGES/DRESSINGS) ×2 IMPLANT
GLOVE BIOGEL PI IND STRL 8 (GLOVE) ×2 IMPLANT
GLOVE BIOGEL PI INDICATOR 8 (GLOVE) ×3
GLOVE ECLIPSE 7.5 STRL STRAW (GLOVE) ×5 IMPLANT
GOWN STRL REUS W/ TWL LRG LVL3 (GOWN DISPOSABLE) ×1 IMPLANT
GOWN STRL REUS W/ TWL XL LVL3 (GOWN DISPOSABLE) ×1 IMPLANT
GOWN STRL REUS W/TWL LRG LVL3 (GOWN DISPOSABLE) ×4
GOWN STRL REUS W/TWL XL LVL3 (GOWN DISPOSABLE) ×2
HOLDER KNEE FOAM BLUE (MISCELLANEOUS) ×2 IMPLANT
KNEE WRAP E Z 3 GEL PACK (MISCELLANEOUS) ×2 IMPLANT
MANIFOLD NEPTUNE II (INSTRUMENTS) ×1 IMPLANT
NDL SAFETY ECLIPSE 18X1.5 (NEEDLE) IMPLANT
NEEDLE HYPO 18GX1.5 SHARP (NEEDLE)
PACK DSU ARTHROSCOPY (CUSTOM PROCEDURE TRAY) ×2 IMPLANT
PAD CAST 4YDX4 CTTN HI CHSV (CAST SUPPLIES) ×1 IMPLANT
PADDING CAST COTTON 4X4 STRL (CAST SUPPLIES) ×2
PENCIL SMOKE EVACUATOR (MISCELLANEOUS) IMPLANT
SET BASIN DAY SURGERY F.S. (CUSTOM PROCEDURE TRAY) ×2 IMPLANT
SUT ETHILON 4 0 PS 2 18 (SUTURE) IMPLANT
SYR 5ML LL (SYRINGE) ×2 IMPLANT
TOWEL GREEN STERILE FF (TOWEL DISPOSABLE) ×2 IMPLANT
TUBING ARTHROSCOPY IRRIG 16FT (MISCELLANEOUS) ×2 IMPLANT
WATER STERILE IRR 1000ML POUR (IV SOLUTION) ×2 IMPLANT

## 2019-12-05 NOTE — Anesthesia Preprocedure Evaluation (Addendum)
Anesthesia Evaluation  Patient identified by MRN, date of birth, ID band Patient awake    Reviewed: Allergy & Precautions, NPO status , Patient's Chart, lab work & pertinent test results  History of Anesthesia Complications Negative for: history of anesthetic complications  Airway Mallampati: III  TM Distance: >3 FB Neck ROM: Full    Dental  (+) Dental Advisory Given, Teeth Intact   Pulmonary PE   Pulmonary exam normal        Cardiovascular hypertension, Pt. on home beta blockers and Pt. on medications +CHF and + DVT  Normal cardiovascular exam   '21 Stress Test - Lexiscan nuclear stress test performed using 1-day protocol. Stress EKG is non-diagnostic, as this is pharmacological stress test. In addition, rest and stress EKG showed sinus rhythm, anterosetpal nonspecific T wave inversion, frequent PVC's. Decreased counts in inferior/inferolateral myocardium seen worse at rest, likely due to breast tissue attenuation with images performed in sitting position. Stress LVEF 48%. Intermediate risk study  '21 TTE - Mild concentric hypertrophy of the left ventricle. Mild global hypokinesis. LVEF 45-50%. Grade II (pseudonormal) diastolic dysfunction, elevated LAP.  Left atrial cavity is severely dilated at 51 cc/cm2. Mild (Grade I) mitral regurgitation. Mild tricuspid regurgitation.    Neuro/Psych  Headaches,  Tinnitus  negative psych ROS   GI/Hepatic Neg liver ROS, hiatal hernia, GERD  Controlled and Medicated,  Endo/Other  Hypothyroidism  Obesity Borderline DM   Renal/GU negative Renal ROS     Musculoskeletal  (+) Arthritis , Osteoarthritis,  Fibromyalgia -  Abdominal   Peds  Hematology  On xarelto Clotting d/o    Anesthesia Other Findings Familial mediterranean fever Covid test negative   Reproductive/Obstetrics                            Anesthesia Physical Anesthesia Plan  ASA:  III  Anesthesia Plan: General   Post-op Pain Management:    Induction: Intravenous  PONV Risk Score and Plan: 3 and Treatment may vary due to age or medical condition and Ondansetron  Airway Management Planned: LMA  Additional Equipment: None  Intra-op Plan:   Post-operative Plan: Extubation in OR  Informed Consent: I have reviewed the patients History and Physical, chart, labs and discussed the procedure including the risks, benefits and alternatives for the proposed anesthesia with the patient or authorized representative who has indicated his/her understanding and acceptance.     Dental advisory given  Plan Discussed with: CRNA and Anesthesiologist  Anesthesia Plan Comments:        Anesthesia Quick Evaluation

## 2019-12-05 NOTE — Discharge Instructions (Signed)
°  Post Anesthesia Home Care Instructions  Activity: Get plenty of rest for the remainder of the day. A responsible individual must stay with you for 24 hours following the procedure.  For the next 24 hours, DO NOT: -Drive a car -Paediatric nurse -Drink alcoholic beverages -Take any medication unless instructed by your physician -Make any legal decisions or sign important papers.  Meals: Start with liquid foods such as gelatin or soup. Progress to regular foods as tolerated. Avoid greasy, spicy, heavy foods. If nausea and/or vomiting occur, drink only clear liquids until the nausea and/or vomiting subsides. Call your physician if vomiting continues.  Special Instructions/Symptoms: Your throat may feel dry or sore from the anesthesia or the breathing tube placed in your throat during surgery. If this causes discomfort, gargle with warm salt water. The discomfort should disappear within 24 hours.  If you had a scopolamine patch placed behind your ear for the management of post- operative nausea and/or vomiting:  1. The medication in the patch is effective for 72 hours, after which it should be removed.  Wrap patch in a tissue and discard in the trash. Wash hands thoroughly with soap and water. 2. You may remove the patch earlier than 72 hours if you experience unpleasant side effects which may include dry mouth, dizziness or visual disturbances. 3. Avoid touching the patch. Wash your hands with soap and water after contact with the patch.    Discharge Instructions After Orthopedic Procedures:  *You may feel tired and weak following your procedure. It is recommended that you limit physical activity for the next 24 hours and rest at home for the remainder of today and tomorrow. *No strenuous activity should be started without your doctor's permission.  Elevate the extremity that you had surgery on to a level above your heart. This should continue for 48 hours or as instructed by your  doctor.  If you had hand, arm or shoulder surgery you should move your fingers frequently unless otherwise instructed by your doctor.  If you had foot, knee or leg surgery you should wiggle your toes frequently unless otherwise instructed by your doctor.  Follow your doctor's exact instructions for activity at home. Use your home equipment as instructed. (Crutches, hard shoes, slings etc.)  Limit your activity as instructed by your doctor.  Report to your doctor should any of the following occur: 1. Extreme swelling of your fingers or toes. 2. Inability to wiggle your fingers or toes. 3. Coldness, pale or bluish color in your fingers or toes. 4. Loss of sensation, numbness or tingling of your fingers or toes. 5. Unusual smell or odor from under your dressing or cast. 6. Excessive bleeding or drainage from the surgical site. 7. Pain not relieved by medication your doctor has prescribed for you. 8. Cast or dressing too tight (do not get your dressing or cast wet or put anything under          your dressing or cast.)  *Do not change your dressing unless instructed by your doctor or discharge nurse. Then follow exact instructions.  *Follow labeled instructions for any medications that your doctor may have prescribed for you. *Should any questions or complications develop following your procedure, PLEASE CONTACT YOUR DOCTOR.

## 2019-12-05 NOTE — H&P (Signed)
A pre op hand p   Chief Complaint: Left knee pain  HPI: Debbie Richards is a 86 y.o. female who presents for evaluation of left knee pain. It has been present for greater than 3 months with MRI showing significant medial meniscal tear and has been worsening. She has failed conservative measures. Pain is rated as moderate.  Past Medical History:  Diagnosis Date  . Borderline diabetes   . Chronic back pain   . Chronic lower back pain   . Clotting disorder ([place])   . Deep vein thrombosis (DVT) ([place])   . DVT, lower extremity ([place]) 01/2002; 06/2003   RLE; LLE  . Enlarged thyroid gland   . 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 Fibromyalgia   . Fibromyalgia   . Gallstones   . GERD (gastroesophageal reflux disease)   . GERD (gastroesophageal reflux disease)   . H/O hiatal hernia   . History of stomach ulcers   . Hyperlipidemia   . Hypertension   . Hypothyroidism   . Insomnia   . Migraine    "I've had 4; last time was when I was in my ?30's"  . Osteoarthritis   . Phlebitis    LLE  . Pneumonia   . Pulmonary embolism ([place]) 2003  . Shortness of breath on exertion   . Tinnitus    Past Surgical History:  Procedure Laterality Date  . ABDOMINAL HYSTERECTOMY  1972  . APPENDECTOMY  1960  . bladder tack    . BREAST BIOPSY     left  . [place]   right  . CATARACT EXTRACTION, BILATERAL  ~ 2011  . CHOLECYSTECTOMY  09/12/2011   Procedure: LAPAROSCOPIC CHOLECYSTECTOMY WITH INTRAOPERATIVE CHOLANGIOGRAM;  Surgeon: Shann Medal, MD;  Location: [facility];  Service: General;  Laterality: N/A;  . excision of goiter    . EXTERNAL EAR SURGERY     x3  . HERNIA REPAIR  1970's   ventral  . MASS EXCISION  12/2001   paravertebral chest mass  . [place]; 1982; after 1982  . mini thoracotomy  12/2001  . THORACOTOMY  2003   procedure done   to remove growth in lung   . TOTAL KNEE ARTHROPLASTY Right 01/30/2017   Procedure: RIGHT TOTAL KNEE ARTHROPLASTY;  Surgeon:  Dorna Leitz, MD;  Location: [facility];  Service: Orthopedics;  Laterality: Right;  With adductor canal block   Social History   Socioeconomic History  . Marital status: Widowed    Spouse name: Not on file  . Number of children: 2  . Years of education: Not on file  . Highest education level: Not on file  Occupational History  . Not on file  Tobacco Use  . Smoking status: Never Smoker  . Smokeless tobacco: Never Used  Substance and Sexual Activity  . Alcohol use: No  . Drug use: No  . Sexual activity: Not Currently  Other Topics Concern  . Not on file  Social History Narrative  . Not on file   Social Determinants of Health   Financial Resource Strain:   . Difficulty of Paying Living Expenses:   Food Insecurity:   . Worried About Charity fundraiser in the Last Year:   . Arboriculturist in the Last Year:   Transportation Needs:   . Film/video editor (Medical):   Marland Kitchen Lack of Transportation (Non-Medical):   Physical Activity:   . Days of Exercise per Week:   .  Minutes of Exercise per Session:   Stress:   . Feeling of Stress :   Social Connections:   . Frequency of Communication with Friends and Family:   . Frequency of Social Gatherings with Friends and Family:   . Attends Religious Services:   . Active Member of Clubs or Organizations:   . Attends Archivist Meetings:   Marland Kitchen Marital Status:    Family History  Problem Relation Age of Onset  . Heart disease Mother        had CHF  . Hyperlipidemia Mother   . Hypertension Mother   . Stroke Father   . Stroke Brother    Allergies  Allergen Reactions  . Dilaudid [Hydromorphone Hcl] Other (See Comments)    Flushing, hallucinations  . Gabapentin Other (See Comments)    RLS with burning  . Lipitor [Atorvastatin Calcium] Other (See Comments)    Muscle aches   . Lovastatin Other (See Comments)    Muscle pain  . Zetia [Ezetimibe] Other (See Comments)    Muscles aches  . Zocor [Simvastatin] Other (See  Comments)    Muscle Aches   Prior to Admission medications   Medication Sig Start Date End Date Taking? Authorizing Provider  atenolol (TENORMIN) 25 MG tablet Take 25 mg by mouth daily.   Yes [provider]  colchicine 0.6 MG tablet Take 0.6 mg by mouth daily.   Yes [provider]  furosemide (LASIX) 20 MG tablet Take 20 mg by mouth daily.    Yes [provider]  gabapentin (NEURONTIN) 300 MG capsule Take 300 mg by mouth 3 (three) times daily.   Yes [provider]  levothyroxine (SYNTHROID) 50 MCG tablet Take 1 tablet by mouth daily. 03/11/19  Yes [provider]  levothyroxine (SYNTHROID, LEVOTHROID) 75 MCG tablet Take 75 mcg by mouth daily before breakfast.    Yes [provider]  Multiple Vitamins-Minerals (CENTRUM SILVER PO) Take 1 tablet by mouth daily.   Yes [provider]  Omega-3 Fatty Acids (OMEGA-3 1450 PO) Take by mouth.   Yes [provider]  Pitavastatin Calcium (LIVALO) 2 MG TABS Take 1 tablet (2 mg total) by mouth daily. 08/24/18  Yes Adrian Prows, MD  rivaroxaban (XARELTO) 20 MG TABS tablet Take 1 tablet (20 mg total) by mouth daily with supper. 02/02/17  Yes Gary Fleet, PA-C  acetaminophen (TYLENOL) 650 MG CR tablet Take 1,300 mg by mouth every 8 (eight) hours as needed for pain.    [provider]  docusate sodium (COLACE) 100 MG capsule Take 1 capsule (100 mg total) by mouth 2 (two) times daily. Patient taking differently: Take 100 mg by mouth 2 (two) times daily as needed.  01/30/17   Gary Fleet, PA-C     Positive ROS: None  All other systems have been reviewed and were otherwise negative with the exception of those mentioned in the HPI and as above.  Physical Exam: Vitals:   12/05/19 1030  BP: 132/60  Pulse: 63  Resp: 20  Temp: 98.6 F (37 C)  SpO2: 98%    General: Alert, no acute distress Cardiovascular: No pedal edema Respiratory: No cyanosis, no use of accessory  musculature GI: No organomegaly, abdomen is soft and non-tender Skin: No lesions in the area of chief complaint Neurologic: Sensation intact distally Psychiatric: Patient is competent for consent with normal mood and affect Lymphatic: No axillary or cervical lymphadenopathy  MUSCULOSKELETAL: Left knee: Painful range of motion.  Limited range of motion.  Positive [place]Murray.  No instability.  Trace effusion.  Assessment/Plan: LEFT KNEE MEDIAL MENISCUS TEAR AND DEGENERATIVE JOINT DISEASE Plan for Procedure(s): ARTHROSCOPY KNEE AND DEBRIDEMENT  The risks benefits and alternatives were discussed with the patient including but not limited to the risks of nonoperative treatment, versus surgical intervention including infection, bleeding, nerve injury, malunion, nonunion, hardware prominence, hardware failure, need for hardware removal, blood clots, cardiopulmonary complications, morbidity, mortality, among others, and they were willing to proceed.  Predicted outcome is good, although there will be at least a six to nine month expected recovery.  Alta Corning, MD 12/05/2019 11:54 AM

## 2019-12-05 NOTE — Transfer of Care (Signed)
Immediate Anesthesia Transfer of Care Note  Patient: Debbie Richards  Procedure(s) Performed: ARTHROSCOPY KNEE AND DEBRIDEMENT (Left Knee)  Patient Location: PACU  Anesthesia Type:General  Level of Consciousness: awake, alert  and oriented  Airway & Oxygen Therapy: Patient Spontanous Breathing and Patient connected to face mask oxygen  Post-op Assessment: Report given to RN and Post -op Vital signs reviewed and stable  Post vital signs: Reviewed and stable  Last Vitals:  Vitals Value Taken Time  BP    Temp    Pulse 74 12/05/19 1306  Resp 21 12/05/19 1306  SpO2 100 % 12/05/19 1306  Vitals shown include unvalidated device data.  Last Pain:  Vitals:   12/05/19 1030  TempSrc: Oral  PainSc: 0-No pain         Complications: No complications documented.

## 2019-12-05 NOTE — Anesthesia Procedure Notes (Signed)
Procedure Name: LMA Insertion Performed by: Verita Lamb, CRNA Pre-anesthesia Checklist: Patient identified, Emergency Drugs available, Suction available and Patient being monitored Patient Re-evaluated:Patient Re-evaluated prior to induction Oxygen Delivery Method: Circle system utilized Preoxygenation: Pre-oxygenation with 100% oxygen Induction Type: IV induction Ventilation: Mask ventilation without difficulty LMA: LMA inserted LMA Size: 4.0 Number of attempts: 1 Airway Equipment and Method: Bite block Placement Confirmation: positive ETCO2 and breath sounds checked- equal and bilateral Tube secured with: Tape Dental Injury: Teeth and Oropharynx as per pre-operative assessment

## 2019-12-06 ENCOUNTER — Encounter (HOSPITAL_BASED_OUTPATIENT_CLINIC_OR_DEPARTMENT_OTHER): Payer: Self-pay | Admitting: Orthopedic Surgery

## 2019-12-06 NOTE — Op Note (Signed)
NAME: Debbie Richards, Debbie Richards MEDICAL RECORD [id number] ACCOUNT [id number] DATE OF BIRTH:19-Feb-1931 FACILITY: [facility] LOCATION: [facility] PHYSICIAN:Clarita Mcelvain L. Macil Crady, MD  OPERATIVE REPORT  DATE OF PROCEDURE:  12/05/2019  PREOPERATIVE DIAGNOSIS:  Medial meniscal tear, left knee.  POSTOPERATIVE DIAGNOSES: 1.  Medial meniscal tear, left knee. 2.  Anterior horn of the lateral meniscal tear. 3.  Chondromalacia of the medial femoral condyle. 4.  Medial shelf plica.  PROCEDURES: 1.  Partial medial and partial lateral meniscectomies. 2.  Chondroplasty medial compartment and patellofemoral compartment. 3.  Medial shelf plica excision.  SURGEON:  Dorna Leitz, MD  ASSISTANT:  Filbert Berthold, PA-C, was present for the entire case and assisted by manipulation of the leg and closing as to minimize OR time.  BRIEF HISTORY:  The patient is an 86 year old female with a history of significant left knee pain.  She has had a previous right total knee replacement, but x-rays did not show bone-on-bone change.  She was miserable and had an MRI showing medial  meniscal tear and some chondromalacia.  After failure of conservative care, she was taken to the operating room for operative knee arthroscopy.  DESCRIPTION OF PROCEDURE:  The patient was taken to the operating room.  After adequate anesthesia was obtained with a general anesthetic, the patient was placed supine on the operating table.  Left leg was prepped and draped in the usual sterile  fashion.  Following this, routine arthroscopic examination of the knee revealed that there was a complex tear of the posterior horn of the medial meniscus.  This was debrided back to a smooth and stable rim of meniscus.  Attention was turned to the  medial femoral condyle, which had some grade II chondromalacia which was debrided back to a smooth and stable rim of articular cartilage.  There was a dense and draping medial shelf plica, which blocked entrance into the medial  compartment.  Once this  was debrided, we were allowed to gain entrance into the medial compartment.  Attention was then turned to the lateral side of the knee where there was an anterior horn lateral meniscal tear, which was debrided.  Once this was debrided, the lateral  compartment was evaluated and noted to have some mild chondromalacia, but nothing dramatic.  At this point, the knee was copiously and thoroughly lavaged, suctioned dry.  The arthroscopic portals were closed with a bandage.  Twenty mL of 0.25% Marcaine  was instilled in the knee for postoperative anesthesia.  Sterile compressive dressing was applied.  The patient was taken to the recovery room.  She was noted to be in satisfactory condition.  Estimated blood loss for procedure was minimal.  VN/NUANCE  D:12/06/2019 T:12/06/2019 JOB:[id number]

## 2019-12-06 NOTE — Anesthesia Postprocedure Evaluation (Signed)
Anesthesia Post Note  Patient: Debbie Richards  Procedure(s) Performed: ARTHROSCOPY KNEE AND DEBRIDEMENT, partial medial and lateral meniscectomy, left femoral patella chondroplasty (Left Knee)     Patient location during evaluation: PACU Anesthesia Type: General Level of consciousness: awake and alert Pain management: pain level controlled Vital Signs Assessment: post-procedure vital signs reviewed and stable Respiratory status: spontaneous breathing, nonlabored ventilation and respiratory function stable Cardiovascular status: blood pressure returned to baseline and stable Postop Assessment: no apparent nausea or vomiting Anesthetic complications: no   No complications documented.  Last Vitals:  Vitals:   12/05/19 1345 12/05/19 1410  BP: 103/73 (!) 115/92  Pulse: 71 69  Resp: (!) 29 18  Temp:  36.5 C  SpO2: 98% 94%    Last Pain:  Vitals:   12/05/19 1345  TempSrc:   PainSc: [place]

## 2019-12-20 DIAGNOSIS — M6281 Muscle weakness (generalized): Secondary | ICD-10-CM | POA: Diagnosis not present

## 2019-12-20 DIAGNOSIS — M25662 Stiffness of left knee, not elsewhere classified: Secondary | ICD-10-CM | POA: Diagnosis not present

## 2019-12-20 DIAGNOSIS — R262 Difficulty in walking, not elsewhere classified: Secondary | ICD-10-CM | POA: Diagnosis not present

## 2019-12-21 DIAGNOSIS — H6241 Otitis externa in other diseases classified elsewhere, right ear: Secondary | ICD-10-CM | POA: Diagnosis not present

## 2019-12-21 DIAGNOSIS — H903 Sensorineural hearing loss, bilateral: Secondary | ICD-10-CM | POA: Diagnosis not present

## 2019-12-21 DIAGNOSIS — H7292 Unspecified perforation of tympanic membrane, left ear: Secondary | ICD-10-CM | POA: Diagnosis not present

## 2019-12-21 DIAGNOSIS — Z974 Presence of external hearing-aid: Secondary | ICD-10-CM | POA: Diagnosis not present

## 2019-12-21 DIAGNOSIS — B369 Superficial mycosis, unspecified: Secondary | ICD-10-CM | POA: Diagnosis not present

## 2019-12-22 DIAGNOSIS — R399 Unspecified symptoms and signs involving the genitourinary system: Secondary | ICD-10-CM | POA: Diagnosis not present

## 2019-12-26 DIAGNOSIS — R262 Difficulty in walking, not elsewhere classified: Secondary | ICD-10-CM | POA: Diagnosis not present

## 2019-12-26 DIAGNOSIS — M25662 Stiffness of left knee, not elsewhere classified: Secondary | ICD-10-CM | POA: Diagnosis not present

## 2019-12-26 DIAGNOSIS — M6281 Muscle weakness (generalized): Secondary | ICD-10-CM | POA: Diagnosis not present

## 2019-12-30 DIAGNOSIS — M25662 Stiffness of left knee, not elsewhere classified: Secondary | ICD-10-CM | POA: Diagnosis not present

## 2019-12-30 DIAGNOSIS — R262 Difficulty in walking, not elsewhere classified: Secondary | ICD-10-CM | POA: Diagnosis not present

## 2019-12-30 DIAGNOSIS — M6281 Muscle weakness (generalized): Secondary | ICD-10-CM | POA: Diagnosis not present

## 2020-01-02 DIAGNOSIS — M25562 Pain in left knee: Secondary | ICD-10-CM | POA: Diagnosis not present

## 2020-01-09 DIAGNOSIS — R262 Difficulty in walking, not elsewhere classified: Secondary | ICD-10-CM | POA: Diagnosis not present

## 2020-01-09 DIAGNOSIS — M6281 Muscle weakness (generalized): Secondary | ICD-10-CM | POA: Diagnosis not present

## 2020-01-09 DIAGNOSIS — M25662 Stiffness of left knee, not elsewhere classified: Secondary | ICD-10-CM | POA: Diagnosis not present

## 2020-01-10 DIAGNOSIS — M25562 Pain in left knee: Secondary | ICD-10-CM | POA: Diagnosis not present

## 2020-01-10 DIAGNOSIS — M1712 Unilateral primary osteoarthritis, left knee: Secondary | ICD-10-CM | POA: Diagnosis not present

## 2020-01-12 DIAGNOSIS — M6281 Muscle weakness (generalized): Secondary | ICD-10-CM | POA: Diagnosis not present

## 2020-01-12 DIAGNOSIS — R262 Difficulty in walking, not elsewhere classified: Secondary | ICD-10-CM | POA: Diagnosis not present

## 2020-01-12 DIAGNOSIS — M25662 Stiffness of left knee, not elsewhere classified: Secondary | ICD-10-CM | POA: Diagnosis not present

## 2020-01-16 DIAGNOSIS — M6281 Muscle weakness (generalized): Secondary | ICD-10-CM | POA: Diagnosis not present

## 2020-01-16 DIAGNOSIS — M25662 Stiffness of left knee, not elsewhere classified: Secondary | ICD-10-CM | POA: Diagnosis not present

## 2020-01-16 DIAGNOSIS — R262 Difficulty in walking, not elsewhere classified: Secondary | ICD-10-CM | POA: Diagnosis not present

## 2020-01-17 ENCOUNTER — Other Ambulatory Visit: Payer: Self-pay

## 2020-01-17 ENCOUNTER — Ambulatory Visit: Payer: Medicare HMO | Admitting: Cardiology

## 2020-01-17 ENCOUNTER — Encounter: Payer: Self-pay | Admitting: Cardiology

## 2020-01-17 VITALS — BP 145/77 | HR 63 | Resp 16 | Ht 70.0 in | Wt 241.0 lb

## 2020-01-17 DIAGNOSIS — I4510 Unspecified right bundle-branch block: Secondary | ICD-10-CM

## 2020-01-17 DIAGNOSIS — Z6833 Body mass index (BMI) 33.0-33.9, adult: Secondary | ICD-10-CM | POA: Diagnosis not present

## 2020-01-17 DIAGNOSIS — E782 Mixed hyperlipidemia: Secondary | ICD-10-CM | POA: Diagnosis not present

## 2020-01-17 DIAGNOSIS — E6609 Other obesity due to excess calories: Secondary | ICD-10-CM | POA: Diagnosis not present

## 2020-01-17 DIAGNOSIS — R0609 Other forms of dyspnea: Secondary | ICD-10-CM | POA: Diagnosis not present

## 2020-01-17 DIAGNOSIS — I5042 Chronic combined systolic (congestive) and diastolic (congestive) heart failure: Secondary | ICD-10-CM | POA: Diagnosis not present

## 2020-01-17 DIAGNOSIS — Z86711 Personal history of pulmonary embolism: Secondary | ICD-10-CM

## 2020-01-17 DIAGNOSIS — I10 Essential (primary) hypertension: Secondary | ICD-10-CM

## 2020-01-17 DIAGNOSIS — Z86718 Personal history of other venous thrombosis and embolism: Secondary | ICD-10-CM

## 2020-01-17 DIAGNOSIS — R0600 Dyspnea, unspecified: Secondary | ICD-10-CM

## 2020-01-17 DIAGNOSIS — E66811 Obesity, class 1: Secondary | ICD-10-CM

## 2020-01-17 DIAGNOSIS — I5021 Acute systolic (congestive) heart failure: Secondary | ICD-10-CM | POA: Diagnosis not present

## 2020-01-17 MED ORDER — ENTRESTO 24-26 MG PO TABS: 1.0000 | ORAL_TABLET | Freq: Two times a day (BID) | ORAL | 0 refills | Status: DC

## 2020-01-17 MED ORDER — METOPROLOL SUCCINATE ER 25 MG PO TB24: 25.0000 mg | ORAL_TABLET | Freq: Every day | ORAL | 2 refills | Status: DC

## 2020-01-17 NOTE — Patient Instructions (Signed)
Labs today  Labs in one week after starting Entresto.  Follow up in 2 weeks.

## 2020-01-17 NOTE — Progress Notes (Signed)
[name] Date of Birth: 1930/08/22 MRN: [id number] Primary Care Provider:Meyers, Annie Main, MD Former Cardiology Providers: Sinclair Grooms, MD, Sueanne Margarita, MD, Jeri Lager, APRN, FNP-C Primary Cardiologist: Rex Kras, DO, [facility] (established care 11/10/2019)  Date: 01/17/2020 Last Office Visit: 11/10/2019  Chief Complaint  Patient presents with  . Shortness of Breath  . Possible CHF  . Follow-up    After knee surgery    HPI  Ayannah A Piscopo is a 86 y.o.  female who presents to the office with a chief complaint of " shortness of breath and post procedure follow-up."  Patient's past medical history and cardiovascular risk factors include: Chronic combined systolic and diastolic heart failure, stage B, NYHA class II/III, hypertension, hypothyroidism, prediabetes, diabetic neuropathy, familial Mediterranean fever, history of DVT, history of PE, postmenopausal female, advanced age.   Today patient is accompanied by her daughter [name] and granddaughter Gregary Signs at today's office visit.  Patient was last seen in the office back in March 2020 by Miquel Dunn for management of hyperlipidemia. She has not been able to tolerate other statins including Lipitor, Crestor, Zocor due to myalgia symptoms.  Statin has drug to drug interaction with many statin drugs and colchicine which she takes for Mediterranean fever.  Since last office visit patient has not been hospitalized or seen in urgent care for cardiovascular symptoms.  Since last office visit patient underwent orthopedic procedure without any cardiovascular complications.  She has undergone outpatient physical therapy.  Overall doing well from orthopedic standpoint.  Today patient's daughter and granddaughter are concerned that she may have underlying congestive heart failure as she has been experiencing more shortness of breath and lower extremity swelling.  Patient states that she does get short of breath with effort related activity more  than what she is usually capable of doing.  She denies orthopnea, paroxysmal nocturnal dyspnea.  No prior history of congestive heart failure.  According to the echo performed in June 2021 patient does have mildly reduced left ventricular systolic function with diastolic grade 2 dysfunction and elevated left atrial pressure.   Since last office visit patient states that she has had 1 episode of chest discomfort.  Located over the anterior chest wall, lasting for about a minute or less, nonradiating, no improving or worsening factors and not with effort related activities.  Patient states that the discomfort was self-limited and never resurfaced again.  Of note patient did undergo nuclear stress test back in June 2021.  Results noted below for further reference.    FUNCTIONAL STATUS: She able to garden and maintain her home. No structured exercise program or daily routine.    ALLERGIES: Allergies  Allergen Reactions  . Dilaudid [Hydromorphone Hcl] Other (See Comments)    Flushing, hallucinations  . Gabapentin Other (See Comments)    RLS with burning  . Lipitor [Atorvastatin Calcium] Other (See Comments)    Muscle aches   . Lovastatin Other (See Comments)    Muscle pain  . Zetia [Ezetimibe] Other (See Comments)    Muscles aches  . Zocor [Simvastatin] Other (See Comments)    Muscle Aches     MEDICATION LIST PRIOR TO VISIT: Current Outpatient Medications on File Prior to Visit  Medication Sig Dispense Refill  . acetaminophen (TYLENOL) 650 MG CR tablet Take 1,300 mg by mouth every 8 (eight) hours as needed for pain.    Marland Kitchen amitriptyline (ELAVIL) 25 MG tablet Take 25 mg by mouth at bedtime.    . colchicine 0.6 MG tablet  Take 0.6 mg by mouth daily.    Marland Kitchen gabapentin (NEURONTIN) 300 MG capsule Take 300 mg by mouth in the morning and at bedtime.     Marland Kitchen levothyroxine (SYNTHROID) 50 MCG tablet Take 1 tablet by mouth daily.    . Multiple Vitamins-Minerals (CENTRUM SILVER PO) Take 1 tablet by mouth  daily.    . Omega-3 Fatty Acids (OMEGA-3 1450 PO) Take by mouth.    . Pitavastatin Calcium (LIVALO) 2 MG TABS Take 1 tablet (2 mg total) by mouth daily. 90 tablet 3  . rivaroxaban (XARELTO) 20 MG TABS tablet Take 1 tablet (20 mg total) by mouth daily with supper. 30 tablet 3   No current facility-administered medications on file prior to visit.    PAST MEDICAL HISTORY: Past Medical History:  Diagnosis Date  . Borderline diabetes   . Chronic back pain   . Chronic lower back pain   . Clotting disorder ([place])   . Deep vein thrombosis (DVT) ([place])   . DVT, lower extremity ([place]) 01/2002; 06/2003   RLE; LLE  . Enlarged thyroid gland   . Familial Mediterranean fever ([place])   . Fibromyalgia   . Fibromyalgia   . Gallstones   . GERD (gastroesophageal reflux disease)   . GERD (gastroesophageal reflux disease)   . H/O hiatal hernia   . History of stomach ulcers   . Hyperlipidemia   . Hypertension   . Hypothyroidism   . Insomnia   . Migraine    "I've had 4; last time was when I was in my ?30's"  . Osteoarthritis   . Phlebitis    LLE  . Pneumonia   . Pulmonary embolism ([place]) 2003  . Shortness of breath on exertion   . Tinnitus     PAST SURGICAL HISTORY: Past Surgical History:  Procedure Laterality Date  . ABDOMINAL HYSTERECTOMY  1972  . APPENDECTOMY  1960  . bladder tack    . BREAST BIOPSY     left  . [place]   right  . CATARACT EXTRACTION, BILATERAL  ~ 2011  . CHOLECYSTECTOMY  09/12/2011   Procedure: LAPAROSCOPIC CHOLECYSTECTOMY WITH INTRAOPERATIVE CHOLANGIOGRAM;  Surgeon: Shann Medal, MD;  Location: [place];  Service: General;  Laterality: N/A;  . excision of goiter    . EXTERNAL EAR SURGERY     x3  . HERNIA REPAIR  1970's   ventral  . KNEE ARTHROSCOPY Left 12/05/2019   Procedure: ARTHROSCOPY KNEE AND DEBRIDEMENT, partial medial and lateral meniscectomy, left femoral patella chondroplasty;  Surgeon: Dorna Leitz, MD;  Location: [place];   Service: Orthopedics;  Laterality: Left;  Marland Kitchen MASS EXCISION  12/2001   paravertebral chest mass  . [place]; 1982; after 1982  . mini thoracotomy  12/2001  . THORACOTOMY  2003   procedure done   to remove growth in lung   . TOTAL KNEE ARTHROPLASTY Right 01/30/2017   Procedure: RIGHT TOTAL KNEE ARTHROPLASTY;  Surgeon: Dorna Leitz, MD;  Location: [facility];  Service: Orthopedics;  Laterality: Right;  With adductor canal block    FAMILY HISTORY: The patient's family history includes Dementia in her sister; Heart disease in her mother; Hyperlipidemia in her mother; Hypertension in her mother; Parkinson's disease in her brother, sister, and sister; Stroke in her father.   SOCIAL HISTORY:  The patient  reports that she has never smoked. She has never used smokeless tobacco. She reports that she does not drink alcohol and does  not use drugs.  Review of Systems  Constitutional: Positive for malaise/fatigue. Negative for chills and fever.  HENT: Negative for hoarse voice and nosebleeds.   Eyes: Negative for discharge, double vision and pain.  Cardiovascular: Positive for dyspnea on exertion and leg swelling. Negative for chest pain, claudication, near-syncope, orthopnea, palpitations, paroxysmal nocturnal dyspnea and syncope.  Respiratory: Negative for hemoptysis and shortness of breath.   Musculoskeletal: Positive for joint pain. Negative for muscle cramps and myalgias.  Gastrointestinal: Negative for abdominal pain, constipation, diarrhea, hematemesis, hematochezia, melena, nausea and vomiting.  Neurological: Negative for dizziness and light-headedness.    PHYSICAL EXAM: Vitals with BMI 01/17/2020 12/05/2019 12/05/2019  Height 5\' 10"  - -  Weight 241 lbs - -  BMI 99.37 - -  Systolic 169 678 938  Diastolic 77 92 73  Pulse 63 69 71    CONSTITUTIONAL: Well-developed and well-nourished. No acute distress.  Ambulates with a cane SKIN: Skin is warm and dry. No rash noted. No cyanosis.  No pallor. No jaundice HEAD: Normocephalic and atraumatic.  EYES: No scleral icterus MOUTH/THROAT: Moist oral membranes.  NECK: No JVD present. No thyromegaly noted. No carotid bruits  LYMPHATIC: No visible cervical adenopathy.  CHEST Normal respiratory effort. No intercostal retractions  LUNGS: Clear to auscultation bilaterally.  No stridor. No wheezes. No rales.  CARDIOVASCULAR: Regular rate and rhythm, positive S1-S2, no murmurs rubs or gallops appreciated ABDOMINAL: .Obese, soft, nontender, nondistended, positive bowel sounds all 4 quadrants.No apparent ascites.  EXTREMITIES: Bilateral +1 peripheral edema, warm to touch, skin changes suggestive of chronic venous stasis. HEMATOLOGIC: No significant bruising NEUROLOGIC: Oriented to person, place, and time. Nonfocal. Normal muscle tone.  PSYCHIATRIC: Normal mood and affect. Normal behavior. Cooperative  CARDIAC DATABASE: EKG: 11/10/2019: Normal sinus rhythm, 71 bpm, right bundle branch block, ST-T changes most likely secondary to RBBB, without underlying ischemia or injury pattern.    Echocardiogram: 09/2011: LVEF 55-60% mildly dilated left atrium, PASP 33 mmHg.  11/14/2019:  Left ventricle cavity is normal in size. Mild concentric hypertrophy of the left ventricle. Mild global hypokinesis. LVEF 45-50%. Doppler evidence of grade II (pseudonormal) diastolic dysfunction, elevated LAP.  Left atrial cavity is severely dilated at 51 cc/cm2.  Mild (Grade I) mitral regurgitation.  Mild tricuspid regurgitation. Estimated pulmonary artery systolic pressure is 21 mmHg.   Stress Testing:  Lexiscan/modified Bruce Tetrofosmin stress test 11/23/2019:  Lexiscan nuclear stress test performed using 1-day protocol. Stress EKG is non-diagnostic, as this is pharmacological stress test. In addition, rest and stress EKG showed sinus rhythm, anterosetpal nonspecific T wave inversion, frequent PVC's.  Decreased counts in inferior/inferolateral myocardium seen  worse at rest, likely due to breast tissue attenuation with images performed in sitting position. Stress LVEF 48%.  Intermediate risk study. Recommend clinical correlation.    Heart Catheterization: None  LABORATORY DATA: CBC Latest Ref Rng & Units 02/02/2017 02/01/2017 01/31/2017  WBC 4.0 - 10.5 K/uL 7.9 13.6(H) 12.4(H)  Hemoglobin 12.0 - 15.0 g/dL 11.8(L) 12.4 12.3  Hematocrit 36 - 46 % 35.4(L) 37.9 37.5  Platelets 150 - 400 K/uL 123(L) 137(L) 122(L)    CMP Latest Ref Rng & Units 01/17/2020 11/11/2019 01/31/2017  Glucose 65 - 99 mg/dL 113(H) 102(H) 194(H)  BUN 8 - 27 mg/dL 16 16 12   Creatinine 0.57 - 1.00 mg/dL 0.91 0.81 0.77  Sodium 134 - 144 mmol/L 143 144 136  Potassium 3.5 - 5.2 mmol/L 4.8 4.3 4.3  Chloride 96 - 106 mmol/L 104 103 105  CO2 20 - 29 mmol/L 24  22 26  Calcium 8.7 - 10.3 mg/dL 9.6 9.6 8.8(L)  Total Protein 6.5 - 8.1 g/dL - - -  Total Bilirubin 0.3 - 1.2 mg/dL - - -  Alkaline Phos 38 - 126 U/L - - -  AST 15 - 41 U/L - - -  ALT 14 - 54 U/L - - -    Lipid Panel     Component Value Date/Time   CHOL 180 07/12/2018 1251   TRIG 130 07/12/2018 1251   HDL 62 07/12/2018 1251   LDLCALC 92 07/12/2018 1251   LABVLDL 26 07/12/2018 1251    Lab Results  Component Value Date   HGBA1C 5.8 (H) 01/23/2017   No components found for: NTPROBNP No results found for: TSH  Cardiac Panel (last 3 results) No results for input(s): CKTOTAL, CKMB, TROPONINIHS, RELINDX in the last 72 hours.  IMPRESSION:    ICD-10-CM   1. Chronic combined systolic and diastolic heart failure (HCC)  I50.42 sacubitril-valsartan (ENTRESTO) 24-26 MG    metoprolol succinate (TOPROL XL) 25 MG 24 hr tablet    Basic metabolic panel    Magnesium    Pro b natriuretic peptide (BNP)    BASIC METABOLIC PANEL WITH GFR    Magnesium    Pro b natriuretic peptide (BNP)  2. Dyspnea on exertion  R06.00   3. Right bundle branch block  I45.10   4. Mixed hyperlipidemia  E78.2   5. Essential hypertension  I10   6.  Hx pulmonary embolism  Z86.711   7. Hx of deep venous thrombosis  Z86.718   8. Class 1 obesity due to excess calories without serious comorbidity with body mass index (BMI) of 33.0 to 33.9 in adult  E66.09    Z68.33      RECOMMENDATIONS: HONESTII MARTON is a 86 y.o. female whose past medical history and cardiovascular risk factors include: Chronic combined systolic and diastolic heart failure, stage B, NYHA class II/III, hypertension, hypothyroidism, prediabetes, diabetic neuropathy, familial Mediterranean fever, history of DVT, history of PE, postmenopausal female, advanced age.   Chronic combined systolic and diastolic heart failure/stage B/NYHA class II/III:  Clinically patient seems to be in heart failure she is having shortness of breath with effort related activities, lower extremity swelling, and dyspnea on exertion.  Unable to evaluate JVP due to orthopedic limitations of getting on examination table.  Recommend checking BMP, magnesium level, and NT proBNP.  Most recent echocardiogram did note mildly reduced left ventricular systolic function, grade 2 diastolic impairment, and elevated left atrial pressure.  Start Entresto 24/26 mg p.o. twice daily.  Patient is asked to discontinue Lasix when she starts Entresto.  Repeat blood work in 1 week to evaluate kidney function.  Transition from atenolol to Toprol-XL 25 mg p.o. daily  Dyspnea on exertion: See above  Mixed hyperlipidemia: Most recent lipid profile reviewed.  Continue Livalo.  Patient does not endorse myalgias.  Right bundle branch block: Continue to monitor.  History of pulmonary embolism and deep venous thrombosis after surgery: Currently on oral anticoagulation.  Obesity, due to excess calories: Body mass index is 34.58 kg/m. . I reviewed with the patient the importance of diet, regular physical activity/exercise, weight loss.   . Patient is educated on increasing physical activity gradually as tolerated.  With  the goal of moderate intensity exercise for 30 minutes a day 5 days a week.  FINAL MEDICATION LIST END OF ENCOUNTER: Meds ordered this encounter  Medications  . sacubitril-valsartan (ENTRESTO) 24-26 MG  Sig: Take 1 tablet by mouth 2 (two) times daily.    Dispense:  60 tablet    Refill:  0  . metoprolol succinate (TOPROL XL) 25 MG 24 hr tablet    Sig: Take 1 tablet (25 mg total) by mouth daily.    Dispense:  30 tablet    Refill:  2     Current Outpatient Medications:  .  acetaminophen (TYLENOL) 650 MG CR tablet, Take 1,300 mg by mouth every 8 (eight) hours as needed for pain., Disp: , Rfl:  .  amitriptyline (ELAVIL) 25 MG tablet, Take 25 mg by mouth at bedtime., Disp: , Rfl:  .  colchicine 0.6 MG tablet, Take 0.6 mg by mouth daily., Disp: , Rfl:  .  gabapentin (NEURONTIN) 300 MG capsule, Take 300 mg by mouth in the morning and at bedtime. , Disp: , Rfl:  .  levothyroxine (SYNTHROID) 50 MCG tablet, Take 1 tablet by mouth daily., Disp: , Rfl:  .  Multiple Vitamins-Minerals (CENTRUM SILVER PO), Take 1 tablet by mouth daily., Disp: , Rfl:  .  Omega-3 Fatty Acids (OMEGA-3 1450 PO), Take by mouth., Disp: , Rfl:  .  Pitavastatin Calcium (LIVALO) 2 MG TABS, Take 1 tablet (2 mg total) by mouth daily., Disp: 90 tablet, Rfl: 3 .  rivaroxaban (XARELTO) 20 MG TABS tablet, Take 1 tablet (20 mg total) by mouth daily with supper., Disp: 30 tablet, Rfl: 3 .  metoprolol succinate (TOPROL XL) 25 MG 24 hr tablet, Take 1 tablet (25 mg total) by mouth daily., Disp: 30 tablet, Rfl: 2 .  sacubitril-valsartan (ENTRESTO) 24-26 MG, Take 1 tablet by mouth 2 (two) times daily., Disp: 60 tablet, Rfl: 0  Orders Placed This Encounter  Procedures  . Basic metabolic panel  . Magnesium  . Pro b natriuretic peptide (BNP)  . BASIC METABOLIC PANEL WITH GFR  . Magnesium  . Pro b natriuretic peptide (BNP)  . Basic metabolic panel   --Continue cardiac medications as reconciled in final medication list. --Return in  about 2 weeks (around 01/31/2020) for heart failure management.. Or sooner if needed. --Continue follow-up with your primary care physician regarding the management of your other chronic comorbid conditions.  Patient's questions and concerns were addressed to her satisfaction. She voices understanding of the instructions provided during this encounter.   This note was created using a voice recognition software as a result there may be grammatical errors inadvertently enclosed that do not reflect the nature of this encounter. Every attempt is made to correct such errors.  Rex Kras, [place], [facility]  Pager: [phone] Office: [phone]

## 2020-01-18 LAB — BASIC METABOLIC PANEL
BUN/Creatinine Ratio: 18 (ref 12–28)
BUN: 16 mg/dL (ref 8–27)
CO2: 24 mmol/L (ref 20–29)
Calcium: 9.6 mg/dL (ref 8.7–10.3)
Chloride: 104 mmol/L (ref 96–106)
Creatinine, Ser: 0.91 mg/dL (ref 0.57–1.00)
GFR calc Af Amer: 66 mL/min/{1.73_m2} (ref >59)
GFR calc non Af Amer: 57 mL/min/{1.73_m2} — ABNORMAL LOW (ref >59)
Glucose: 113 mg/dL — ABNORMAL HIGH (ref 65–99)
Potassium: 4.8 mmol/L (ref 3.5–5.2)
Sodium: 143 mmol/L (ref 134–144)

## 2020-01-18 LAB — PRO B NATRIURETIC PEPTIDE: NT-Pro BNP: 392 pg/mL (ref 0–738)

## 2020-01-18 LAB — MAGNESIUM: Magnesium: 2.3 mg/dL (ref 1.6–2.3)

## 2020-01-22 ENCOUNTER — Other Ambulatory Visit: Payer: Self-pay | Admitting: Cardiology

## 2020-01-22 DIAGNOSIS — I5042 Chronic combined systolic (congestive) and diastolic (congestive) heart failure: Secondary | ICD-10-CM

## 2020-01-24 NOTE — Progress Notes (Signed)
Spoke with patient and informed her of lab results and to continue medications as directed. She expressed understanding. She says she will get labs done this week.

## 2020-01-25 DIAGNOSIS — I5042 Chronic combined systolic (congestive) and diastolic (congestive) heart failure: Secondary | ICD-10-CM | POA: Diagnosis not present

## 2020-01-25 DIAGNOSIS — M25662 Stiffness of left knee, not elsewhere classified: Secondary | ICD-10-CM | POA: Diagnosis not present

## 2020-01-25 DIAGNOSIS — R262 Difficulty in walking, not elsewhere classified: Secondary | ICD-10-CM | POA: Diagnosis not present

## 2020-01-25 DIAGNOSIS — M6281 Muscle weakness (generalized): Secondary | ICD-10-CM | POA: Diagnosis not present

## 2020-01-26 LAB — BASIC METABOLIC PANEL
BUN/Creatinine Ratio: 14 (ref 12–28)
BUN: 13 mg/dL (ref 8–27)
CO2: 25 mmol/L (ref 20–29)
Calcium: 9.5 mg/dL (ref 8.7–10.3)
Chloride: 103 mmol/L (ref 96–106)
Creatinine, Ser: 0.9 mg/dL (ref 0.57–1.00)
GFR calc Af Amer: 66 mL/min/{1.73_m2} (ref >59)
GFR calc non Af Amer: 58 mL/min/{1.73_m2} — ABNORMAL LOW (ref >59)
Glucose: 118 mg/dL — ABNORMAL HIGH (ref 65–99)
Potassium: 4.9 mmol/L (ref 3.5–5.2)
Sodium: 140 mmol/L (ref 134–144)

## 2020-01-26 LAB — PRO B NATRIURETIC PEPTIDE: NT-Pro BNP: 251 pg/mL (ref 0–738)

## 2020-01-26 LAB — MAGNESIUM: Magnesium: 2.2 mg/dL (ref 1.6–2.3)

## 2020-01-27 NOTE — Progress Notes (Signed)
Spoke with patient. Patient voiced understanding.

## 2020-02-01 ENCOUNTER — Other Ambulatory Visit: Payer: Self-pay

## 2020-02-01 ENCOUNTER — Ambulatory Visit: Payer: Medicare HMO | Admitting: Cardiology

## 2020-02-01 ENCOUNTER — Encounter: Payer: Self-pay | Admitting: Cardiology

## 2020-02-01 VITALS — BP 137/64 | HR 66 | Resp 15 | Ht 70.0 in | Wt 240.0 lb

## 2020-02-01 DIAGNOSIS — E782 Mixed hyperlipidemia: Secondary | ICD-10-CM

## 2020-02-01 DIAGNOSIS — Z86711 Personal history of pulmonary embolism: Secondary | ICD-10-CM

## 2020-02-01 DIAGNOSIS — Z6833 Body mass index (BMI) 33.0-33.9, adult: Secondary | ICD-10-CM | POA: Diagnosis not present

## 2020-02-01 DIAGNOSIS — I10 Essential (primary) hypertension: Secondary | ICD-10-CM

## 2020-02-01 DIAGNOSIS — R0609 Other forms of dyspnea: Secondary | ICD-10-CM | POA: Diagnosis not present

## 2020-02-01 DIAGNOSIS — R0600 Dyspnea, unspecified: Secondary | ICD-10-CM

## 2020-02-01 DIAGNOSIS — E6609 Other obesity due to excess calories: Secondary | ICD-10-CM

## 2020-02-01 DIAGNOSIS — I5042 Chronic combined systolic (congestive) and diastolic (congestive) heart failure: Secondary | ICD-10-CM

## 2020-02-01 DIAGNOSIS — Z86718 Personal history of other venous thrombosis and embolism: Secondary | ICD-10-CM | POA: Diagnosis not present

## 2020-02-01 DIAGNOSIS — I4510 Unspecified right bundle-branch block: Secondary | ICD-10-CM

## 2020-02-01 MED ORDER — ENTRESTO 24-26 MG PO TABS: 2.0000 | ORAL_TABLET | Freq: Two times a day (BID) | ORAL | Status: DC

## 2020-02-01 NOTE — Progress Notes (Signed)
Debbie Richards Date of Birth: 1930-06-23 MRN: [id number] Primary Care Provider:Meyers, Annie Main, MD Former Cardiology Providers: Sinclair Grooms, MD, Sueanne Margarita, MD, Jeri Lager, APRN, FNP-C Primary Cardiologist: Rex Kras, DO, [facility] (established care 11/10/2019)  Date: 02/01/20 Last Office Visit: 01/17/2020  Chief Complaint  Patient presents with  . Follow-up    2 week  . Heart failure Management    HPI  Debbie Richards is a 86 y.o.  female who presents to the office with a chief complaint of " heart failure management."  Patient's past medical history and cardiovascular risk factors include: Chronic combined systolic and diastolic heart failure, stage B, NYHA class II/III, hypertension, hypothyroidism, prediabetes, diabetic neuropathy, familial Mediterranean fever, history of DVT, history of PE, postmenopausal female, advanced age.   Today patient is accompanied by her daughter Debbie Richards at today's office visit.  Patient was last seen in the office back in March 2020 by Miquel Dunn for management of hyperlipidemia. She has not been able to tolerate other statins including Lipitor, Crestor, Zocor due to myalgia symptoms.  Statin has drug to drug interaction with many statin drugs and colchicine which she takes for Mediterranean fever.    At the last office visit patient was started on Entresto 24/26 mg p.o. twice daily and her atenolol was changed to metoprolol given her heart failure with reduced EF.  Patient has tolerated the initiation of Entresto well without any side effects or intolerances.  Her kidney function also has remained stable.  Patient states that her shortness of breath has improved compared to her baseline.  Patient's lower extremity swelling is improved and so was the NT proBNP.  She has lost approximately 1 pound since last office visit.  She denies orthopnea or paroxysmal nocturnal dyspnea.  For reasons unknown she never got the metoprolol filled.  She denies any  chest pain at rest or with effort related activities.  FUNCTIONAL STATUS: She able to garden and maintain her home. No structured exercise program or daily routine.    ALLERGIES: Allergies  Allergen Reactions  . Dilaudid [Hydromorphone Hcl] Other (See Comments)    Flushing, hallucinations  . Gabapentin Other (See Comments)    RLS with burning  . Lipitor [Atorvastatin Calcium] Other (See Comments)    Muscle aches   . Lovastatin Other (See Comments)    Muscle pain  . Zetia [Ezetimibe] Other (See Comments)    Muscles aches  . Zocor [Simvastatin] Other (See Comments)    Muscle Aches     MEDICATION LIST PRIOR TO VISIT: Current Outpatient Medications on File Prior to Visit  Medication Sig Dispense Refill  . acetaminophen (TYLENOL) 650 MG CR tablet Take 1,300 mg by mouth every 8 (eight) hours as needed for pain.    Marland Kitchen colchicine 0.6 MG tablet Take 0.6 mg by mouth daily.    Marland Kitchen gabapentin (NEURONTIN) 300 MG capsule Take 300 mg by mouth in the morning and at bedtime.     Marland Kitchen levothyroxine (SYNTHROID) 50 MCG tablet Take 1 tablet by mouth daily.    . Multiple Vitamins-Minerals (CENTRUM SILVER PO) Take 1 tablet by mouth daily.    . Omega-3 Fatty Acids (OMEGA-3 1450 PO) Take by mouth.    Marland Kitchen omeprazole (PRILOSEC) 20 MG capsule Take 20 mg by mouth daily.    . Pitavastatin Calcium (LIVALO) 2 MG TABS Take 1 tablet (2 mg total) by mouth daily. 90 tablet 3  . rivaroxaban (XARELTO) 20 MG TABS tablet Take 1 tablet (20 mg total)  by mouth daily with supper. 30 tablet 3  . metoprolol succinate (TOPROL XL) 25 MG 24 hr tablet Take 1 tablet (25 mg total) by mouth daily. (Patient not taking: Reported on 02/01/2020) 30 tablet 2   No current facility-administered medications on file prior to visit.    PAST MEDICAL HISTORY: Past Medical History:  Diagnosis Date  . Borderline diabetes   . Chronic back pain   . Chronic lower back pain   . Clotting disorder ([place])   . Deep vein thrombosis (DVT) ([place])   . DVT,  lower extremity ([place]) 01/2002; 06/2003   RLE; LLE  . Enlarged thyroid gland   . Familial Mediterranean fever ([place])   . Fibromyalgia   . Fibromyalgia   . Gallstones   . GERD (gastroesophageal reflux disease)   . GERD (gastroesophageal reflux disease)   . H/O hiatal hernia   . History of stomach ulcers   . Hyperlipidemia   . Hypertension   . Hypothyroidism   . Insomnia   . Migraine    "I've had 4; last time was when I was in my ?30's"  . Osteoarthritis   . Phlebitis    LLE  . Pneumonia   . Pulmonary embolism ([place]) 2003  . Shortness of breath on exertion   . Tinnitus     PAST SURGICAL HISTORY: Past Surgical History:  Procedure Laterality Date  . ABDOMINAL HYSTERECTOMY  1972  . APPENDECTOMY  1960  . bladder tack    . BREAST BIOPSY     left  . [place]   right  . CATARACT EXTRACTION, BILATERAL  ~ 2011  . CHOLECYSTECTOMY  09/12/2011   Procedure: LAPAROSCOPIC CHOLECYSTECTOMY WITH INTRAOPERATIVE CHOLANGIOGRAM;  Surgeon: Shann Medal, MD;  Location: [facility];  Service: General;  Laterality: N/A;  . excision of goiter    . EXTERNAL EAR SURGERY     x3  . HERNIA REPAIR  1970's   ventral  . KNEE ARTHROSCOPY Left 12/05/2019   Procedure: ARTHROSCOPY KNEE AND DEBRIDEMENT, partial medial and lateral meniscectomy, left femoral patella chondroplasty;  Surgeon: Dorna Leitz, MD;  Location: [facility];  Service: Orthopedics;  Laterality: Left;  Marland Kitchen MASS EXCISION  12/2001   paravertebral chest mass  . [place]; 1982; after 1982  . mini thoracotomy  12/2001  . THORACOTOMY  2003   procedure done   to remove growth in lung   . TOTAL KNEE ARTHROPLASTY Right 01/30/2017   Procedure: RIGHT TOTAL KNEE ARTHROPLASTY;  Surgeon: Dorna Leitz, MD;  Location: [facility];  Service: Orthopedics;  Laterality: Right;  With adductor canal block    FAMILY HISTORY: The patient's family history includes Dementia in her sister; Heart disease in her mother;  Hyperlipidemia in her mother; Hypertension in her mother; Parkinson's disease in her brother, sister, and sister; Stroke in her father.   SOCIAL HISTORY:  The patient  reports that she has never smoked. She has never used smokeless tobacco. She reports that she does not drink alcohol and does not use drugs.  Review of Systems  Constitutional: Positive for malaise/fatigue. Negative for chills and fever.  HENT: Negative for hoarse voice and nosebleeds.   Eyes: Negative for discharge, double vision and pain.  Cardiovascular: Positive for dyspnea on exertion (improving) and leg swelling (improving). Negative for chest pain, claudication, near-syncope, orthopnea, palpitations, paroxysmal nocturnal dyspnea and syncope.  Respiratory: Negative for hemoptysis and shortness of breath.   Musculoskeletal: Positive for joint pain. Negative  for muscle cramps and myalgias.  Gastrointestinal: Negative for abdominal pain, constipation, diarrhea, hematemesis, hematochezia, melena, nausea and vomiting.  Neurological: Negative for dizziness and light-headedness.    PHYSICAL EXAM: Vitals with BMI 02/01/2020 01/17/2020 12/05/2019  Height 5\' 10"  5\' 10"  -  Weight 240 lbs 241 lbs -  BMI 99.83 38.25 -  Systolic 053 976 734  Diastolic 64 77 92  Pulse 66 63 69    CONSTITUTIONAL: Well-developed and well-nourished. No acute distress.  Ambulates with a cane SKIN: Skin is warm and dry. No rash noted. No cyanosis. No pallor. No jaundice HEAD: Normocephalic and atraumatic.  EYES: No scleral icterus MOUTH/THROAT: Moist oral membranes.  NECK: No JVD present. No thyromegaly noted. No carotid bruits  LYMPHATIC: No visible cervical adenopathy.  CHEST Normal respiratory effort. No intercostal retractions  LUNGS: Clear to auscultation bilaterally.  No stridor. No wheezes. No rales.  CARDIOVASCULAR: Regular rate and rhythm, positive S1-S2, no murmurs rubs or gallops appreciated ABDOMINAL: .Obese, soft, nontender,  nondistended, positive bowel sounds all 4 quadrants.No apparent ascites.  EXTREMITIES: Bilateral +1 peripheral edema, warm to touch, skin changes suggestive of chronic venous stasis. HEMATOLOGIC: No significant bruising NEUROLOGIC: Oriented to person, place, and time. Nonfocal. Normal muscle tone.  PSYCHIATRIC: Normal mood and affect. Normal behavior. Cooperative  CARDIAC DATABASE: EKG: 11/10/2019: Normal sinus rhythm, 71 bpm, right bundle branch block, ST-T changes most likely secondary to RBBB, without underlying ischemia or injury pattern.    Echocardiogram: 09/2011: LVEF 55-60% mildly dilated left atrium, PASP 33 mmHg.  11/14/2019:  Left ventricle cavity is normal in size. Mild concentric hypertrophy of the left ventricle. Mild global hypokinesis. LVEF 45-50%. Doppler evidence of grade II (pseudonormal) diastolic dysfunction, elevated LAP.  Left atrial cavity is severely dilated at 51 cc/cm2.  Mild (Grade I) mitral regurgitation.  Mild tricuspid regurgitation. Estimated pulmonary artery systolic pressure is 21 mmHg.   Stress Testing:  Lexiscan/modified Bruce Tetrofosmin stress test 11/23/2019:  Lexiscan nuclear stress test performed using 1-day protocol. Stress EKG is non-diagnostic, as this is pharmacological stress test. In addition, rest and stress EKG showed sinus rhythm, anterosetpal nonspecific T wave inversion, frequent PVC's.  Decreased counts in inferior/inferolateral myocardium seen worse at rest, likely due to breast tissue attenuation with images performed in sitting position. Stress LVEF 48%.  Intermediate risk study. Recommend clinical correlation.    Heart Catheterization: None  LABORATORY DATA: CBC Latest Ref Rng & Units 02/02/2017 02/01/2017 01/31/2017  WBC 4.0 - 10.5 K/uL 7.9 13.6(H) 12.4(H)  Hemoglobin 12.0 - 15.0 g/dL 11.8(L) 12.4 12.3  Hematocrit 36 - 46 % 35.4(L) 37.9 37.5  Platelets 150 - 400 K/uL 123(L) 137(L) 122(L)    CMP Latest Ref Rng & Units 01/25/2020  01/17/2020 11/11/2019  Glucose 65 - 99 mg/dL 118(H) 113(H) 102(H)  BUN 8 - 27 mg/dL 13 16 16   Creatinine 0.57 - 1.00 mg/dL 0.90 0.91 0.81  Sodium 134 - 144 mmol/L 140 143 144  Potassium 3.5 - 5.2 mmol/L 4.9 4.8 4.3  Chloride 96 - 106 mmol/L 103 104 103  CO2 20 - 29 mmol/L 25 24 22   Calcium 8.7 - 10.3 mg/dL 9.5 9.6 9.6  Total Protein 6.5 - 8.1 g/dL - - -  Total Bilirubin 0.3 - 1.2 mg/dL - - -  Alkaline Phos 38 - 126 U/L - - -  AST 15 - 41 U/L - - -  ALT 14 - 54 U/L - - -    Lipid Panel     Component Value Date/Time  CHOL 180 07/12/2018 1251   TRIG 130 07/12/2018 1251   HDL 62 07/12/2018 1251   LDLCALC 92 07/12/2018 1251   LABVLDL 26 07/12/2018 1251    Lab Results  Component Value Date   HGBA1C 5.8 (H) 01/23/2017   No components found for: NTPROBNP No results found for: TSH  Cardiac Panel (last 3 results) No results for input(s): CKTOTAL, CKMB, TROPONINIHS, RELINDX in the last 72 hours.  IMPRESSION:    ICD-10-CM   1. Chronic combined systolic and diastolic heart failure (HCC)  I50.42 sacubitril-valsartan (ENTRESTO) 24-26 MG    Basic metabolic panel    Pro b natriuretic peptide (BNP)    Magnesium  2. Dyspnea on exertion  R06.00 sacubitril-valsartan (ENTRESTO) 24-26 MG  3. Right bundle branch block  I45.10   4. Mixed hyperlipidemia  E78.2   5. Essential hypertension  I10   6. Hx pulmonary embolism  Z86.711   7. Hx of deep venous thrombosis  Z86.718   8. Class 1 obesity due to excess calories without serious comorbidity with body mass index (BMI) of 33.0 to 33.9 in adult  E66.09    Z68.33      RECOMMENDATIONS: CHIQUITA HECKERT is a 86 y.o. female whose past medical history and cardiovascular risk factors include: Chronic combined systolic and diastolic heart failure, stage B, NYHA class II/III, hypertension, hypothyroidism, prediabetes, diabetic neuropathy, familial Mediterranean fever, history of DVT, history of PE, postmenopausal female, advanced age.   Chronic  combined systolic and diastolic heart failure/stage B/NYHA class II/III:  Most recent echocardiogram did note mildly reduced left ventricular systolic function, grade 2 diastolic impairment, and elevated left atrial pressure.  Increase Entresto to two tabs of 24/26 mg p.o. twice daily (patient states that the cost of medication is too high for her. Recommended patient assistance for now).   Repeat blood work in 1 week to evaluate kidney function.  If Cr and electrolytes remain stable will change the script to Entresto 49/51mg  po bid.   Transition from atenolol to Toprol-XL 25 mg p.o. daily. She will pick up the script today as well.   Dyspnea on exertion: See above  Mixed hyperlipidemia: Most recent lipid profile reviewed.  Continue Livalo.  Patient does not endorse myalgias.  Right bundle branch block: Continue to monitor.  History of pulmonary embolism and deep venous thrombosis after surgery: Currently on oral anticoagulation.  Obesity, due to excess calories: Body mass index is 34.44 kg/m. . I reviewed with the patient the importance of diet, regular physical activity/exercise, weight loss.   . Patient is educated on increasing physical activity gradually as tolerated.  With the goal of moderate intensity exercise for 30 minutes a day 5 days a week.  Independently reviewed the most recent lab results with the patient and her daughter at today's office visit.  Also called the patient's pharmacy to make sure that prescription available for pickup.  FINAL MEDICATION LIST END OF ENCOUNTER: Meds ordered this encounter  Medications  . sacubitril-valsartan (ENTRESTO) 24-26 MG    Sig: Take 2 tablets by mouth 2 (two) times daily.    Dispense:  60 tablet     Current Outpatient Medications:  .  acetaminophen (TYLENOL) 650 MG CR tablet, Take 1,300 mg by mouth every 8 (eight) hours as needed for pain., Disp: , Rfl:  .  colchicine 0.6 MG tablet, Take 0.6 mg by mouth daily., Disp: , Rfl:   .  gabapentin (NEURONTIN) 300 MG capsule, Take 300 mg by mouth in the morning  and at bedtime. , Disp: , Rfl:  .  levothyroxine (SYNTHROID) 50 MCG tablet, Take 1 tablet by mouth daily., Disp: , Rfl:  .  Multiple Vitamins-Minerals (CENTRUM SILVER PO), Take 1 tablet by mouth daily., Disp: , Rfl:  .  Omega-3 Fatty Acids (OMEGA-3 1450 PO), Take by mouth., Disp: , Rfl:  .  omeprazole (PRILOSEC) 20 MG capsule, Take 20 mg by mouth daily., Disp: , Rfl:  .  Pitavastatin Calcium (LIVALO) 2 MG TABS, Take 1 tablet (2 mg total) by mouth daily., Disp: 90 tablet, Rfl: 3 .  rivaroxaban (XARELTO) 20 MG TABS tablet, Take 1 tablet (20 mg total) by mouth daily with supper., Disp: 30 tablet, Rfl: 3 .  sacubitril-valsartan (ENTRESTO) 24-26 MG, Take 2 tablets by mouth 2 (two) times daily., Disp: 60 tablet, Rfl:  .  metoprolol succinate (TOPROL XL) 25 MG 24 hr tablet, Take 1 tablet (25 mg total) by mouth daily. (Patient not taking: Reported on 02/01/2020), Disp: 30 tablet, Rfl: 2  Orders Placed This Encounter  Procedures  . Basic metabolic panel  . Pro b natriuretic peptide (BNP)  . Magnesium   --Continue cardiac medications as reconciled in final medication list. --Return in about 4 weeks (around 02/29/2020) for heart failure management.. Or sooner if needed. --Continue follow-up with your primary care physician regarding the management of your other chronic comorbid conditions.  Patient's questions and concerns were addressed to her satisfaction. She voices understanding of the instructions provided during this encounter.   This note was created using a voice recognition software as a result there may be grammatical errors inadvertently enclosed that do not reflect the nature of this encounter. Every attempt is made to correct such errors.  Total time spent 30 minutes.  Rex Kras, [place], [facility]  Pager: [phone] Office: [phone]

## 2020-02-08 ENCOUNTER — Telehealth: Payer: Self-pay

## 2020-02-08 NOTE — Telephone Encounter (Signed)
Yes, she can even go get blood work today.  She is on Entresto 24/26mg  po two tabs twice daily.  Plan was to increase the dose if she can tolerate the higher dose based on kidney function.

## 2020-02-08 NOTE — Telephone Encounter (Signed)
Patient called and asked if you wanted her to still continue her meds, she said she is going to have her blood work Architectural technologist. She will run out of meds before Friday. Also I noticed she has an appointment with Dr. Virgina Jock also?

## 2020-02-08 NOTE — Telephone Encounter (Signed)
OK 

## 2020-02-09 ENCOUNTER — Other Ambulatory Visit: Payer: Self-pay | Admitting: Cardiology

## 2020-02-09 DIAGNOSIS — R0600 Dyspnea, unspecified: Secondary | ICD-10-CM

## 2020-02-09 DIAGNOSIS — R0609 Other forms of dyspnea: Secondary | ICD-10-CM

## 2020-02-09 DIAGNOSIS — I5042 Chronic combined systolic (congestive) and diastolic (congestive) heart failure: Secondary | ICD-10-CM | POA: Diagnosis not present

## 2020-02-10 ENCOUNTER — Telehealth: Payer: Self-pay | Admitting: Cardiology

## 2020-02-10 LAB — BASIC METABOLIC PANEL
BUN/Creatinine Ratio: 14 (ref 12–28)
BUN: 13 mg/dL (ref 8–27)
CO2: 24 mmol/L (ref 20–29)
Calcium: 9.5 mg/dL (ref 8.7–10.3)
Chloride: 104 mmol/L (ref 96–106)
Creatinine, Ser: 0.9 mg/dL (ref 0.57–1.00)
GFR calc Af Amer: 66 mL/min/{1.73_m2} (ref >59)
GFR calc non Af Amer: 58 mL/min/{1.73_m2} — ABNORMAL LOW (ref >59)
Glucose: 135 mg/dL — ABNORMAL HIGH (ref 65–99)
Potassium: 5.4 mmol/L — ABNORMAL HIGH (ref 3.5–5.2)
Sodium: 142 mmol/L (ref 134–144)

## 2020-02-10 LAB — PRO B NATRIURETIC PEPTIDE: NT-Pro BNP: 419 pg/mL (ref 0–738)

## 2020-02-10 LAB — MAGNESIUM: Magnesium: 2.1 mg/dL (ref 1.6–2.3)

## 2020-02-10 NOTE — Telephone Encounter (Signed)
Called to discuss recent labs. No answer. Left a message.  ST

## 2020-02-20 ENCOUNTER — Other Ambulatory Visit: Payer: Self-pay

## 2020-02-20 DIAGNOSIS — I5042 Chronic combined systolic (congestive) and diastolic (congestive) heart failure: Secondary | ICD-10-CM

## 2020-02-20 NOTE — Progress Notes (Signed)
Patient stated that she is currently on Entresto 24-26mg  and can go in tomorrow to repeat blood work to recheck Potassium level, in the morning. Patient stated that she feels fine on this dose of Entresto.

## 2020-02-21 DIAGNOSIS — I5042 Chronic combined systolic (congestive) and diastolic (congestive) heart failure: Secondary | ICD-10-CM | POA: Diagnosis not present

## 2020-02-22 LAB — BASIC METABOLIC PANEL
BUN/Creatinine Ratio: 9 — ABNORMAL LOW (ref 12–28)
BUN: 8 mg/dL (ref 8–27)
CO2: 24 mmol/L (ref 20–29)
Calcium: 9.7 mg/dL (ref 8.7–10.3)
Chloride: 103 mmol/L (ref 96–106)
Creatinine, Ser: 0.91 mg/dL (ref 0.57–1.00)
GFR calc Af Amer: 66 mL/min/{1.73_m2} (ref >59)
GFR calc non Af Amer: 57 mL/min/{1.73_m2} — ABNORMAL LOW (ref >59)
Glucose: 124 mg/dL — ABNORMAL HIGH (ref 65–99)
Potassium: 4.8 mmol/L (ref 3.5–5.2)
Sodium: 141 mmol/L (ref 134–144)

## 2020-02-22 LAB — PRO B NATRIURETIC PEPTIDE: NT-Pro BNP: 336 pg/mL (ref 0–738)

## 2020-02-22 LAB — MAGNESIUM: Magnesium: 2 mg/dL (ref 1.6–2.3)

## 2020-03-01 ENCOUNTER — Ambulatory Visit: Payer: Medicare HMO | Admitting: Cardiology

## 2020-03-07 ENCOUNTER — Other Ambulatory Visit: Payer: Self-pay

## 2020-03-07 ENCOUNTER — Encounter: Payer: Self-pay | Admitting: Cardiology

## 2020-03-07 ENCOUNTER — Ambulatory Visit: Payer: Medicare HMO | Admitting: Cardiology

## 2020-03-07 VITALS — BP 110/63 | HR 95 | Resp 16 | Ht 70.0 in | Wt 236.0 lb

## 2020-03-07 DIAGNOSIS — Z6833 Body mass index (BMI) 33.0-33.9, adult: Secondary | ICD-10-CM | POA: Diagnosis not present

## 2020-03-07 DIAGNOSIS — E6609 Other obesity due to excess calories: Secondary | ICD-10-CM

## 2020-03-07 DIAGNOSIS — Z86718 Personal history of other venous thrombosis and embolism: Secondary | ICD-10-CM

## 2020-03-07 DIAGNOSIS — I4510 Unspecified right bundle-branch block: Secondary | ICD-10-CM | POA: Diagnosis not present

## 2020-03-07 DIAGNOSIS — R0609 Other forms of dyspnea: Secondary | ICD-10-CM

## 2020-03-07 DIAGNOSIS — I10 Essential (primary) hypertension: Secondary | ICD-10-CM

## 2020-03-07 DIAGNOSIS — E782 Mixed hyperlipidemia: Secondary | ICD-10-CM

## 2020-03-07 DIAGNOSIS — I5042 Chronic combined systolic (congestive) and diastolic (congestive) heart failure: Secondary | ICD-10-CM | POA: Diagnosis not present

## 2020-03-07 DIAGNOSIS — Z86711 Personal history of pulmonary embolism: Secondary | ICD-10-CM

## 2020-03-07 DIAGNOSIS — R0600 Dyspnea, unspecified: Secondary | ICD-10-CM

## 2020-03-07 MED ORDER — ENTRESTO 49-51 MG PO TABS: 1.0000 | ORAL_TABLET | Freq: Two times a day (BID) | ORAL | 0 refills | Status: DC

## 2020-03-07 NOTE — Progress Notes (Signed)
Debbie Richards Date of Birth: 1930/12/06 MRN: [id number] Primary Care Provider:Meyers, Annie Main, MD Former Cardiology Providers: Sinclair Grooms, MD, Sueanne Margarita, MD, Jeri Lager, APRN, FNP-C Primary Cardiologist: Rex Kras, DO, [facility] (established care 11/10/2019)  Date: 03/07/20 Last Office Visit: 02/01/2020  Chief Complaint  Patient presents with  . Congestive Heart Failure  . Follow-up    4 week    HPI  Debbie Richards is a 86 y.o.  female who presents to the office with a chief complaint of " heart failure management."  Patient's past medical history and cardiovascular risk factors include: Chronic combined systolic and diastolic heart failure, stage B, NYHA class II/III, hypertension, hypothyroidism, prediabetes, diabetic neuropathy, familial Mediterranean fever, history of DVT, history of PE, postmenopausal female, advanced age.   Today patient is accompanied by her daughter [name] at today's office visit.  Patient was being followed by Miquel Dunn in the past for management of hyperlipidemia.  She has been unable to tolerate statin medications including Lipitor, Crestor, Zocor.  She is able to tolerate Livalo.    In regards to the management of congestive heart failure patient was started on Entresto during our last prior visits.  We increased Entresto dose to 24/26 mg 2 tablets twice daily and she has tolerated the medication well.  Repeat blood work independently reviewed which noted preserved kidney function and electrolytes within normal limits.  We will increase the Entresto to 49/51 mg p.o. twice daily.  Patient states that the medication is expensive and she is currently in the donut hole.  Did not have any samples to provide to her at today's office visit.  She is asked to call us back.  She denies orthopnea, paroxysmal nocturnal dyspnea.  Her lower extremity swelling has improved.  Patient has lost an additional 4 pounds since last office visit.  She denies any chest pain  at rest or with effort related activities.  FUNCTIONAL STATUS: She able to garden and maintain her home. No structured exercise program or daily routine.    ALLERGIES: Allergies  Allergen Reactions  . Dilaudid [Hydromorphone Hcl] Other (See Comments)    Flushing, hallucinations  . Gabapentin Other (See Comments)    RLS with burning  . Lipitor [Atorvastatin Calcium] Other (See Comments)    Muscle aches   . Lovastatin Other (See Comments)    Muscle pain  . Zetia [Ezetimibe] Other (See Comments)    Muscles aches  . Zocor [Simvastatin] Other (See Comments)    Muscle Aches     MEDICATION LIST PRIOR TO VISIT: Current Outpatient Medications on File Prior to Visit  Medication Sig Dispense Refill  . acetaminophen (TYLENOL) 650 MG CR tablet Take 1,300 mg by mouth every 8 (eight) hours as needed for pain.    Marland Kitchen colchicine 0.6 MG tablet Take 0.6 mg by mouth daily.    Marland Kitchen gabapentin (NEURONTIN) 300 MG capsule Take 300 mg by mouth in the morning and at bedtime.     Marland Kitchen levothyroxine (SYNTHROID) 50 MCG tablet Take 1 tablet by mouth daily.    . metoprolol succinate (TOPROL XL) 25 MG 24 hr tablet Take 1 tablet (25 mg total) by mouth daily. 30 tablet 2  . Multiple Vitamins-Minerals (CENTRUM SILVER PO) Take 1 tablet by mouth daily.    . Omega-3 Fatty Acids (OMEGA-3 1450 PO) Take by mouth.    Marland Kitchen omeprazole (PRILOSEC) 20 MG capsule Take 20 mg by mouth daily.    . Pitavastatin Calcium (LIVALO) 2 MG TABS Take  1 tablet (2 mg total) by mouth daily. 90 tablet 3  . rivaroxaban (XARELTO) 20 MG TABS tablet Take 1 tablet (20 mg total) by mouth daily with supper. 30 tablet 3   No current facility-administered medications on file prior to visit.    PAST MEDICAL HISTORY: Past Medical History:  Diagnosis Date  . Borderline diabetes   . Chronic back pain   . Chronic lower back pain   . Clotting disorder ([place])   . Deep vein thrombosis (DVT) ([place])   . DVT, lower extremity ([place]) 01/2002; 06/2003   RLE; LLE  .  Enlarged thyroid gland   . Familial Mediterranean fever ([place])   . Fibromyalgia   . Fibromyalgia   . Gallstones   . GERD (gastroesophageal reflux disease)   . GERD (gastroesophageal reflux disease)   . H/O hiatal hernia   . History of stomach ulcers   . Hyperlipidemia   . Hypertension   . Hypothyroidism   . Insomnia   . Migraine    "I've had 4; last time was when I was in my ?30's"  . Osteoarthritis   . Phlebitis    LLE  . Pneumonia   . Pulmonary embolism ([place]) 2003  . Shortness of breath on exertion   . Tinnitus     PAST SURGICAL HISTORY: Past Surgical History:  Procedure Laterality Date  . ABDOMINAL HYSTERECTOMY  1972  . APPENDECTOMY  1960  . bladder tack    . BREAST BIOPSY     left  . [place]   right  . CATARACT EXTRACTION, BILATERAL  ~ 2011  . CHOLECYSTECTOMY  09/12/2011   Procedure: LAPAROSCOPIC CHOLECYSTECTOMY WITH INTRAOPERATIVE CHOLANGIOGRAM;  Surgeon: Shann Medal, MD;  Location: [facility];  Service: General;  Laterality: N/A;  . excision of goiter    . EXTERNAL EAR SURGERY     x3  . HERNIA REPAIR  1970's   ventral  . KNEE ARTHROSCOPY Left 12/05/2019   Procedure: ARTHROSCOPY KNEE AND DEBRIDEMENT, partial medial and lateral meniscectomy, left femoral patella chondroplasty;  Surgeon: Dorna Leitz, MD;  Location: [facility];  Service: Orthopedics;  Laterality: Left;  Marland Kitchen MASS EXCISION  12/2001   paravertebral chest mass  . [place]; 1982; after 1982  . mini thoracotomy  12/2001  . THORACOTOMY  2003   procedure done   to remove growth in lung   . TOTAL KNEE ARTHROPLASTY Right 01/30/2017   Procedure: RIGHT TOTAL KNEE ARTHROPLASTY;  Surgeon: Dorna Leitz, MD;  Location: [facility];  Service: Orthopedics;  Laterality: Right;  With adductor canal block    FAMILY HISTORY: The patient's family history includes Dementia in her sister; Heart disease in her mother; Hyperlipidemia in her mother; Hypertension in her mother;  Parkinson's disease in her brother, sister, and sister; Stroke in her father.   SOCIAL HISTORY:  The patient  reports that she has never smoked. She has never used smokeless tobacco. She reports that she does not drink alcohol and does not use drugs.  Review of Systems  Constitutional: Negative for chills, fever and malaise/fatigue.  HENT: Negative for hoarse voice and nosebleeds.   Eyes: Negative for discharge, double vision and pain.  Cardiovascular: Positive for dyspnea on exertion (improving) and leg swelling (improving). Negative for chest pain, claudication, near-syncope, orthopnea, palpitations, paroxysmal nocturnal dyspnea and syncope.  Respiratory: Negative for hemoptysis and shortness of breath.   Musculoskeletal: Positive for joint pain. Negative for muscle cramps and myalgias.  Gastrointestinal: Negative for abdominal pain, constipation, diarrhea, hematemesis, hematochezia, melena, nausea and vomiting.  Neurological: Negative for dizziness and light-headedness.    PHYSICAL EXAM: Vitals with BMI 03/07/2020 02/01/2020 01/17/2020  Height 5\' 10"  5\' 10"  5\' 10"   Weight 236 lbs 240 lbs 241 lbs  BMI 33.86 52.77 82.42  Systolic 353 614 431  Diastolic 63 64 77  Pulse 95 66 63    CONSTITUTIONAL: Well-developed and well-nourished. No acute distress.  Ambulates with a cane SKIN: Skin is warm and dry. No rash noted. No cyanosis. No pallor. No jaundice HEAD: Normocephalic and atraumatic.  EYES: No scleral icterus MOUTH/THROAT: Moist oral membranes.  NECK: No JVD present. No thyromegaly noted. No carotid bruits  LYMPHATIC: No visible cervical adenopathy.  CHEST Normal respiratory effort. No intercostal retractions  LUNGS: Clear to auscultation bilaterally.  No stridor. No wheezes. No rales.  CARDIOVASCULAR: Regular rate and rhythm, positive S1-S2, no murmurs rubs or gallops appreciated ABDOMINAL: .Obese, soft, nontender, nondistended, positive bowel sounds all 4 quadrants.No apparent  ascites.  EXTREMITIES: Bilateral +1 peripheral edema, warm to touch, skin changes suggestive of chronic venous stasis. HEMATOLOGIC: No significant bruising NEUROLOGIC: Oriented to person, place, and time. Nonfocal. Normal muscle tone.  PSYCHIATRIC: Normal mood and affect. Normal behavior. Cooperative  CARDIAC DATABASE: EKG: 11/10/2019: Normal sinus rhythm, 71 bpm, right bundle branch block, ST-T changes most likely secondary to RBBB, without underlying ischemia or injury pattern.    Echocardiogram: 09/2011: LVEF 55-60% mildly dilated left atrium, PASP 33 mmHg.  11/14/2019:  Left ventricle cavity is normal in size. Mild concentric hypertrophy of the left ventricle. Mild global hypokinesis. LVEF 45-50%. Doppler evidence of grade II (pseudonormal) diastolic dysfunction, elevated LAP.  Left atrial cavity is severely dilated at 51 cc/cm2.  Mild (Grade I) mitral regurgitation.  Mild tricuspid regurgitation. Estimated pulmonary artery systolic pressure is 21 mmHg.   Stress Testing:  Lexiscan/modified Bruce Tetrofosmin stress test 11/23/2019:  Lexiscan nuclear stress test performed using 1-day protocol. Stress EKG is non-diagnostic, as this is pharmacological stress test. In addition, rest and stress EKG showed sinus rhythm, anterosetpal nonspecific T wave inversion, frequent PVC's.  Decreased counts in inferior/inferolateral myocardium seen worse at rest, likely due to breast tissue attenuation with images performed in sitting position. Stress LVEF 48%.  Intermediate risk study. Recommend clinical correlation.    Heart Catheterization: None  LABORATORY DATA: CBC Latest Ref Rng & Units 02/02/2017 02/01/2017 01/31/2017  WBC 4.0 - 10.5 K/uL 7.9 13.6(H) 12.4(H)  Hemoglobin 12.0 - 15.0 g/dL 11.8(L) 12.4 12.3  Hematocrit 36 - 46 % 35.4(L) 37.9 37.5  Platelets 150 - 400 K/uL 123(L) 137(L) 122(L)    CMP Latest Ref Rng & Units 02/21/2020 02/09/2020 01/25/2020  Glucose 65 - 99 mg/dL 124(H) 135(H) 118(H)  BUN  8 - 27 mg/dL 8 13 13   Creatinine 0.57 - 1.00 mg/dL 0.91 0.90 0.90  Sodium 134 - 144 mmol/L 141 142 140  Potassium 3.5 - 5.2 mmol/L 4.8 5.4(H) 4.9  Chloride 96 - 106 mmol/L 103 104 103  CO2 20 - 29 mmol/L 24 24 25   Calcium 8.7 - 10.3 mg/dL 9.7 9.5 9.5  Total Protein 6.5 - 8.1 g/dL - - -  Total Bilirubin 0.3 - 1.2 mg/dL - - -  Alkaline Phos 38 - 126 U/L - - -  AST 15 - 41 U/L - - -  ALT 14 - 54 U/L - - -    Lipid Panel     Component Value Date/Time   CHOL 180 07/12/2018 1251  TRIG 130 07/12/2018 1251   HDL 62 07/12/2018 1251   LDLCALC 92 07/12/2018 1251   LABVLDL 26 07/12/2018 1251    Lab Results  Component Value Date   HGBA1C 5.8 (H) 01/23/2017   No components found for: NTPROBNP No results found for: TSH  Cardiac Panel (last 3 results) No results for input(s): CKTOTAL, CKMB, TROPONINIHS, RELINDX in the last 72 hours.  IMPRESSION:    ICD-10-CM   1. Chronic combined systolic and diastolic heart failure (HCC)  I50.42 sacubitril-valsartan (ENTRESTO) 49-51 MG  2. Dyspnea on exertion  R06.00   3. Right bundle branch block  I45.10   4. Mixed hyperlipidemia  E78.2   5. Essential hypertension  I10   6. Hx pulmonary embolism  Z86.711   7. Hx of deep venous thrombosis  Z86.718   8. Class 1 obesity due to excess calories without serious comorbidity with body mass index (BMI) of 33.0 to 33.9 in adult  E66.09    Z68.33      RECOMMENDATIONS: MARCAYLA BUDGE is a 86 y.o. female whose past medical history and cardiovascular risk factors include: Chronic combined systolic and diastolic heart failure, stage B, NYHA class II/III, hypertension, hypothyroidism, prediabetes, diabetic neuropathy, familial Mediterranean fever, history of DVT, history of PE, postmenopausal female, advanced age.   Chronic combined systolic and diastolic heart failure/stage B/NYHA class II/III:  Medications reconciled.  Prescription for Entresto 49/51 mg p.o. twice daily sent to her pharmacy.  Patient  requested samples but they are unavailable at today's office visit.  Educated on increasing physical activity as tolerated, low-salt diet, and improving her modifiable cardiovascular risk factors.    Dyspnea on exertion: Chronic, improving.  See above  Mixed hyperlipidemia: Most recent lipid profile reviewed.  Continue Livalo.  Patient does not endorse myalgias.  Right bundle branch block: Continue to monitor.  History of pulmonary embolism and deep venous thrombosis after surgery: Currently on oral anticoagulation.  Obesity, due to excess calories: Body mass index is 33.86 kg/m. . I reviewed with the patient the importance of diet, regular physical activity/exercise, weight loss.   . Patient is educated on increasing physical activity gradually as tolerated.  With the goal of moderate intensity exercise for 30 minutes a day 5 days a week.  Independently reviewed the most recent lab results with the patient and her daughter at today's office visit.   FINAL MEDICATION LIST END OF ENCOUNTER: Meds ordered this encounter  Medications  . sacubitril-valsartan (ENTRESTO) 49-51 MG    Sig: Take 1 tablet by mouth 2 (two) times daily.    Dispense:  180 tablet    Refill:  0     Current Outpatient Medications:  .  acetaminophen (TYLENOL) 650 MG CR tablet, Take 1,300 mg by mouth every 8 (eight) hours as needed for pain., Disp: , Rfl:  .  colchicine 0.6 MG tablet, Take 0.6 mg by mouth daily., Disp: , Rfl:  .  gabapentin (NEURONTIN) 300 MG capsule, Take 300 mg by mouth in the morning and at bedtime. , Disp: , Rfl:  .  levothyroxine (SYNTHROID) 50 MCG tablet, Take 1 tablet by mouth daily., Disp: , Rfl:  .  metoprolol succinate (TOPROL XL) 25 MG 24 hr tablet, Take 1 tablet (25 mg total) by mouth daily., Disp: 30 tablet, Rfl: 2 .  Multiple Vitamins-Minerals (CENTRUM SILVER PO), Take 1 tablet by mouth daily., Disp: , Rfl:  .  Omega-3 Fatty Acids (OMEGA-3 1450 PO), Take by mouth., Disp: , Rfl:  .  omeprazole (PRILOSEC) 20 MG capsule, Take 20 mg by mouth daily., Disp: , Rfl:  .  Pitavastatin Calcium (LIVALO) 2 MG TABS, Take 1 tablet (2 mg total) by mouth daily., Disp: 90 tablet, Rfl: 3 .  rivaroxaban (XARELTO) 20 MG TABS tablet, Take 1 tablet (20 mg total) by mouth daily with supper., Disp: 30 tablet, Rfl: 3 .  sacubitril-valsartan (ENTRESTO) 49-51 MG, Take 1 tablet by mouth 2 (two) times daily., Disp: 180 tablet, Rfl: 0  No orders of the defined types were placed in this encounter.  --Continue cardiac medications as reconciled in final medication list. --Return in about 6 months (around 09/04/2020) for heart failure management.. Or sooner if needed. --Continue follow-up with your primary care physician regarding the management of your other chronic comorbid conditions.  Patient's questions and concerns were addressed to her satisfaction. She voices understanding of the instructions provided during this encounter.   This note was created using a voice recognition software as a result there may be grammatical errors inadvertently enclosed that do not reflect the nature of this encounter. Every attempt is made to correct such errors.  Total time spent 30 minutes.  Rex Kras, [place], [facility]  Pager: [phone] Office: [phone]

## 2020-03-09 DIAGNOSIS — Z23 Encounter for immunization: Secondary | ICD-10-CM | POA: Diagnosis not present

## 2020-03-26 DIAGNOSIS — M25562 Pain in left knee: Secondary | ICD-10-CM | POA: Diagnosis not present

## 2020-03-26 DIAGNOSIS — M1712 Unilateral primary osteoarthritis, left knee: Secondary | ICD-10-CM | POA: Diagnosis not present

## 2020-04-23 ENCOUNTER — Other Ambulatory Visit: Payer: Self-pay

## 2020-04-23 DIAGNOSIS — I5042 Chronic combined systolic (congestive) and diastolic (congestive) heart failure: Secondary | ICD-10-CM

## 2020-04-23 MED ORDER — METOPROLOL SUCCINATE ER 25 MG PO TB24: 25.0000 mg | ORAL_TABLET | Freq: Every day | ORAL | 2 refills | Status: DC

## 2020-05-11 ENCOUNTER — Ambulatory Visit: Payer: Medicare HMO | Admitting: Cardiology

## 2020-05-21 ENCOUNTER — Other Ambulatory Visit: Payer: Self-pay | Admitting: Cardiology

## 2020-05-21 DIAGNOSIS — I5042 Chronic combined systolic (congestive) and diastolic (congestive) heart failure: Secondary | ICD-10-CM

## 2020-05-22 DIAGNOSIS — R69 Illness, unspecified: Secondary | ICD-10-CM | POA: Diagnosis not present

## 2020-06-04 DIAGNOSIS — R69 Illness, unspecified: Secondary | ICD-10-CM | POA: Diagnosis not present

## 2020-07-04 ENCOUNTER — Other Ambulatory Visit: Payer: Self-pay | Admitting: Cardiology

## 2020-07-04 DIAGNOSIS — I5042 Chronic combined systolic (congestive) and diastolic (congestive) heart failure: Secondary | ICD-10-CM

## 2020-07-19 DIAGNOSIS — M1712 Unilateral primary osteoarthritis, left knee: Secondary | ICD-10-CM | POA: Diagnosis not present

## 2020-07-26 DIAGNOSIS — M1712 Unilateral primary osteoarthritis, left knee: Secondary | ICD-10-CM | POA: Diagnosis not present

## 2020-08-01 DIAGNOSIS — H43812 Vitreous degeneration, left eye: Secondary | ICD-10-CM | POA: Diagnosis not present

## 2020-08-01 DIAGNOSIS — H3562 Retinal hemorrhage, left eye: Secondary | ICD-10-CM | POA: Diagnosis not present

## 2020-08-01 DIAGNOSIS — Z0100 Encounter for examination of eyes and vision without abnormal findings: Secondary | ICD-10-CM | POA: Diagnosis not present

## 2020-08-01 DIAGNOSIS — H04123 Dry eye syndrome of bilateral lacrimal glands: Secondary | ICD-10-CM | POA: Diagnosis not present

## 2020-08-01 DIAGNOSIS — E7800 Pure hypercholesterolemia, unspecified: Secondary | ICD-10-CM | POA: Diagnosis not present

## 2020-08-01 DIAGNOSIS — Z135 Encounter for screening for eye and ear disorders: Secondary | ICD-10-CM | POA: Diagnosis not present

## 2020-08-01 DIAGNOSIS — H5200 Hypermetropia, unspecified eye: Secondary | ICD-10-CM | POA: Diagnosis not present

## 2020-08-01 DIAGNOSIS — I10 Essential (primary) hypertension: Secondary | ICD-10-CM | POA: Diagnosis not present

## 2020-08-02 DIAGNOSIS — M1712 Unilateral primary osteoarthritis, left knee: Secondary | ICD-10-CM | POA: Diagnosis not present

## 2020-08-02 DIAGNOSIS — M25562 Pain in left knee: Secondary | ICD-10-CM | POA: Diagnosis not present

## 2020-09-03 ENCOUNTER — Other Ambulatory Visit: Payer: Self-pay

## 2020-09-03 ENCOUNTER — Encounter: Payer: Self-pay | Admitting: Cardiology

## 2020-09-03 ENCOUNTER — Ambulatory Visit: Payer: Medicare HMO | Admitting: Cardiology

## 2020-09-03 VITALS — BP 113/68 | HR 88 | Temp 98.0°F | Resp 16 | Ht 70.0 in | Wt 227.6 lb

## 2020-09-03 DIAGNOSIS — R0609 Other forms of dyspnea: Secondary | ICD-10-CM

## 2020-09-03 DIAGNOSIS — Z86711 Personal history of pulmonary embolism: Secondary | ICD-10-CM | POA: Diagnosis not present

## 2020-09-03 DIAGNOSIS — I10 Essential (primary) hypertension: Secondary | ICD-10-CM

## 2020-09-03 DIAGNOSIS — E6609 Other obesity due to excess calories: Secondary | ICD-10-CM | POA: Diagnosis not present

## 2020-09-03 DIAGNOSIS — E782 Mixed hyperlipidemia: Secondary | ICD-10-CM

## 2020-09-03 DIAGNOSIS — Z6832 Body mass index (BMI) 32.0-32.9, adult: Secondary | ICD-10-CM | POA: Diagnosis not present

## 2020-09-03 DIAGNOSIS — I5042 Chronic combined systolic (congestive) and diastolic (congestive) heart failure: Secondary | ICD-10-CM | POA: Diagnosis not present

## 2020-09-03 DIAGNOSIS — R0600 Dyspnea, unspecified: Secondary | ICD-10-CM

## 2020-09-03 DIAGNOSIS — I4510 Unspecified right bundle-branch block: Secondary | ICD-10-CM | POA: Diagnosis not present

## 2020-09-03 DIAGNOSIS — Z86718 Personal history of other venous thrombosis and embolism: Secondary | ICD-10-CM

## 2020-09-03 NOTE — Progress Notes (Signed)
[name] Date of Birth: Apr 11, 1931 MRN: [id number] Primary Care Provider:Meyers, Annie Main, MD Former Cardiology Providers: Sinclair Grooms, MD, Sueanne Margarita, MD, Jeri Lager, APRN, FNP-C Primary Cardiologist: Rex Kras, DO, [facility] (established care 11/10/2019)  Date: 09/03/20 Last Office Visit: 03/07/2020  Chief Complaint  Patient presents with  . Chronic combined systolic and diastolic heart failure   . Follow-up    HPI  Preet A Spurling is a 87 y.o.  female who presents to the office with a chief complaint of " 84-month follow-up for heart failure management."  Patient's past medical history and cardiovascular risk factors include: Chronic combined systolic and diastolic heart failure, stage B, NYHA class II/III, hypertension, hypothyroidism, prediabetes, diabetic neuropathy, familial Mediterranean fever, history of DVT, history of PE, postmenopausal female, advanced age.   Today patient is accompanied by her daughter [name] at today's office visit.  Patient was being followed by Miquel Dunn in the past for management of hyperlipidemia.  She has been unable to tolerate statin medications including Lipitor, Crestor, Zocor.  She is able to tolerate Livalo.    Since establishing care with myself patient has also developed congestive heart failure for which her medical therapy and slowly uptitrated.  She is currently on Entresto 49/51 mg p.o. twice daily and tolerating the medication well.  No hospitalizations for congestive heart failure since last office encounter.  Patient has lost an additional 9 pounds due to lifestyle modifications.  His wife noted him efforts.  She continues to have shortness of breath) but this is chronic and stable.  She denies orthopnea, or paroxysmal nocturnal dyspnea.  Her lower extremity swelling remains stable.  FUNCTIONAL STATUS: She able to garden and maintain her home. No structured exercise program or daily routine.    ALLERGIES: Allergies  Allergen  Reactions  . Dilaudid [Hydromorphone Hcl] Other (See Comments)    Flushing, hallucinations  . Gabapentin Other (See Comments)    RLS with burning  . Lipitor [Atorvastatin Calcium] Other (See Comments)    Muscle aches   . Lovastatin Other (See Comments)    Muscle pain  . Zetia [Ezetimibe] Other (See Comments)    Muscles aches  . Zocor [Simvastatin] Other (See Comments)    Muscle Aches     MEDICATION LIST PRIOR TO VISIT: Current Outpatient Medications on File Prior to Visit  Medication Sig Dispense Refill  . acetaminophen (TYLENOL) 650 MG CR tablet Take 1,300 mg by mouth every 8 (eight) hours as needed for pain.    Marland Kitchen colchicine 0.6 MG tablet Take 0.6 mg by mouth daily.    Marland Kitchen ENTRESTO 49-51 MG TAKE 1 TABLET BY MOUTH TWICE A DAY 180 tablet 0  . gabapentin (NEURONTIN) 300 MG capsule Take 300 mg by mouth in the morning and at bedtime.     Marland Kitchen levothyroxine (SYNTHROID) 50 MCG tablet Take 1 tablet by mouth daily.    . metoprolol succinate (TOPROL XL) 25 MG 24 hr tablet Take 1 tablet (25 mg total) by mouth daily. 90 tablet 2  . Multiple Vitamins-Minerals (CENTRUM SILVER PO) Take 1 tablet by mouth daily.    . Pitavastatin Calcium (LIVALO) 2 MG TABS Take 1 tablet (2 mg total) by mouth daily. 90 tablet 3  . rivaroxaban (XARELTO) 20 MG TABS tablet Take 1 tablet (20 mg total) by mouth daily with supper. 30 tablet 3   No current facility-administered medications on file prior to visit.    PAST MEDICAL HISTORY: Past Medical History:  Diagnosis Date  .  Borderline diabetes   . Chronic back pain   . Chronic lower back pain   . Clotting disorder ([place])   . Deep vein thrombosis (DVT) ([place])   . DVT, lower extremity ([place]) 01/2002; 06/2003   RLE; LLE  . Enlarged thyroid gland   . Familial Mediterranean fever ([place])   . Fibromyalgia   . Fibromyalgia   . Gallstones   . GERD (gastroesophageal reflux disease)   . GERD (gastroesophageal reflux disease)   . H/O hiatal hernia   . History of stomach  ulcers   . Hyperlipidemia   . Hypertension   . Hypothyroidism   . Insomnia   . Migraine    "I've had 4; last time was when I was in my ?30's"  . Osteoarthritis   . Phlebitis    LLE  . Pneumonia   . Pulmonary embolism ([place]) 2003  . Shortness of breath on exertion   . Tinnitus     PAST SURGICAL HISTORY: Past Surgical History:  Procedure Laterality Date  . ABDOMINAL HYSTERECTOMY  1972  . APPENDECTOMY  1960  . bladder tack    . BREAST BIOPSY     left  . [place]   right  . CATARACT EXTRACTION, BILATERAL  ~ 2011  . CHOLECYSTECTOMY  09/12/2011   Procedure: LAPAROSCOPIC CHOLECYSTECTOMY WITH INTRAOPERATIVE CHOLANGIOGRAM;  Surgeon: Shann Medal, MD;  Location: [place];  Service: General;  Laterality: N/A;  . excision of goiter    . EXTERNAL EAR SURGERY     x3  . HERNIA REPAIR  1970's   ventral  . KNEE ARTHROSCOPY Left 12/05/2019   Procedure: ARTHROSCOPY KNEE AND DEBRIDEMENT, partial medial and lateral meniscectomy, left femoral patella chondroplasty;  Surgeon: Dorna Leitz, MD;  Location: [place];  Service: Orthopedics;  Laterality: Left;  Marland Kitchen MASS EXCISION  12/2001   paravertebral chest mass  . [place]; 1982; after 1982  . mini thoracotomy  12/2001  . THORACOTOMY  2003   procedure done   to remove growth in lung   . TOTAL KNEE ARTHROPLASTY Right 01/30/2017   Procedure: RIGHT TOTAL KNEE ARTHROPLASTY;  Surgeon: Dorna Leitz, MD;  Location: [facility];  Service: Orthopedics;  Laterality: Right;  With adductor canal block    FAMILY HISTORY: The patient's family history includes Dementia in her sister; Heart disease in her mother; Hyperlipidemia in her mother; Hypertension in her mother; Parkinson's disease in her brother, sister, and sister; Stroke in her father.   SOCIAL HISTORY:  The patient  reports that she has never smoked. She has never used smokeless tobacco. She reports that she does not drink alcohol and does not use  drugs.  Review of Systems  Constitutional: Negative for chills, fever and malaise/fatigue.  HENT: Negative for hoarse voice and nosebleeds.   Eyes: Negative for discharge, double vision and pain.  Cardiovascular: Positive for dyspnea on exertion (improving) and leg swelling (improving). Negative for chest pain, claudication, near-syncope, orthopnea, palpitations, paroxysmal nocturnal dyspnea and syncope.  Respiratory: Negative for hemoptysis and shortness of breath.   Musculoskeletal: Positive for joint pain. Negative for muscle cramps and myalgias.  Gastrointestinal: Negative for abdominal pain, constipation, diarrhea, hematemesis, hematochezia, melena, nausea and vomiting.  Neurological: Negative for dizziness and light-headedness.    PHYSICAL EXAM: Vitals with BMI 09/03/2020 03/07/2020 02/01/2020  Height 5\' 10"  5\' 10"  5\' 10"   Weight 227 lbs 10 oz 236 lbs 240 lbs  BMI 32.66 16.01 09.32  Systolic 355 732  867  Diastolic 68 63 64  Pulse 88 95 66    CONSTITUTIONAL: Well-developed and well-nourished. No acute distress.  Ambulates with a cane SKIN: Skin is warm and dry. No rash noted. No cyanosis. No pallor. No jaundice HEAD: Normocephalic and atraumatic.  EYES: No scleral icterus MOUTH/THROAT: Moist oral membranes.  NECK: No JVD present. No thyromegaly noted. No carotid bruits  LYMPHATIC: No visible cervical adenopathy.  CHEST Normal respiratory effort. No intercostal retractions  LUNGS: Clear to auscultation bilaterally.  No stridor. No wheezes. No rales.  CARDIOVASCULAR: Regular rate and rhythm, positive S1-S2, no murmurs rubs or gallops appreciated ABDOMINAL: .Obese, soft, nontender, nondistended, positive bowel sounds all 4 quadrants.No apparent ascites.  EXTREMITIES: Bilateral +1 peripheral edema, warm to touch, skin changes suggestive of chronic venous stasis. HEMATOLOGIC: No significant bruising NEUROLOGIC: Oriented to person, place, and time. Nonfocal. Normal muscle tone.   PSYCHIATRIC: Normal mood and affect. Normal behavior. Cooperative  CARDIAC DATABASE: EKG: 09/03/2020: Normal sinus rhythm, 81 bpm, right bundle branch block, occasional PVCs.    Echocardiogram: 09/2011: LVEF 55-60% mildly dilated left atrium, PASP 33 mmHg.  11/14/2019:  Left ventricle cavity is normal in size. Mild concentric hypertrophy of the left ventricle. Mild global hypokinesis. LVEF 45-50%. Doppler evidence of grade II (pseudonormal) diastolic dysfunction, elevated LAP.  Left atrial cavity is severely dilated at 51 cc/cm2.  Mild (Grade I) mitral regurgitation.  Mild tricuspid regurgitation. Estimated pulmonary artery systolic pressure is 21 mmHg.   Stress Testing:  Lexiscan/modified Bruce Tetrofosmin stress test 11/23/2019:  Lexiscan nuclear stress test performed using 1-day protocol. Stress EKG is non-diagnostic, as this is pharmacological stress test. In addition, rest and stress EKG showed sinus rhythm, anterosetpal nonspecific T wave inversion, frequent PVC's.  Decreased counts in inferior/inferolateral myocardium seen worse at rest, likely due to breast tissue attenuation with images performed in sitting position. Stress LVEF 48%.  Intermediate risk study. Recommend clinical correlation.    Heart Catheterization: None  LABORATORY DATA: CBC Latest Ref Rng & Units 02/02/2017 02/01/2017 01/31/2017  WBC 4.0 - 10.5 K/uL 7.9 13.6(H) 12.4(H)  Hemoglobin 12.0 - 15.0 g/dL 11.8(L) 12.4 12.3  Hematocrit 36.0 - 46.0 % 35.4(L) 37.9 37.5  Platelets 150 - 400 K/uL 123(L) 137(L) 122(L)    CMP Latest Ref Rng & Units 02/21/2020 02/09/2020 01/25/2020  Glucose 65 - 99 mg/dL 124(H) 135(H) 118(H)  BUN 8 - 27 mg/dL 8 13 13   Creatinine 0.57 - 1.00 mg/dL 0.91 0.90 0.90  Sodium 134 - 144 mmol/L 141 142 140  Potassium 3.5 - 5.2 mmol/L 4.8 5.4(H) 4.9  Chloride 96 - 106 mmol/L 103 104 103  CO2 20 - 29 mmol/L 24 24 25   Calcium 8.7 - 10.3 mg/dL 9.7 9.5 9.5  Total Protein 6.5 - 8.1 g/dL - - -  Total  Bilirubin 0.3 - 1.2 mg/dL - - -  Alkaline Phos 38 - 126 U/L - - -  AST 15 - 41 U/L - - -  ALT 14 - 54 U/L - - -    Lipid Panel     Component Value Date/Time   CHOL 180 07/12/2018 1251   TRIG 130 07/12/2018 1251   HDL 62 07/12/2018 1251   LDLCALC 92 07/12/2018 1251   LABVLDL 26 07/12/2018 1251    Lab Results  Component Value Date   HGBA1C 5.8 (H) 01/23/2017   No components found for: NTPROBNP No results found for: TSH  Cardiac Panel (last 3 results) No results for input(s): CKTOTAL, CKMB, TROPONINIHS, RELINDX in the  last 72 hours.  IMPRESSION:    ICD-10-CM   1. Chronic combined systolic and diastolic heart failure (HCC)  I50.42 EKG 83-TDVV    Basic metabolic panel    Pro b natriuretic peptide (BNP)    Magnesium  2. Dyspnea on exertion  O16.07 Basic metabolic panel    Pro b natriuretic peptide (BNP)    Magnesium  3. Right bundle branch block  I45.10   4. Mixed hyperlipidemia  E78.2   5. Essential hypertension  I10   6. Hx pulmonary embolism  Z86.711   7. Hx of deep venous thrombosis  Z86.718   8. Class 1 obesity due to excess calories without serious comorbidity with body mass index (BMI) of 32.0 to 32.9 in adult  E66.09    Z68.32      RECOMMENDATIONS: PATSY ZARAGOZA is a 87 y.o. female whose past medical history and cardiovascular risk factors include: Chronic combined systolic and diastolic heart failure, stage B, NYHA class II/III, hypertension, hypothyroidism, prediabetes, diabetic neuropathy, familial Mediterranean fever, history of DVT, history of PE, postmenopausal female, advanced age.   Chronic combined systolic and diastolic heart failure/stage B/NYHA class II/III:  Medications reconciled.  For now patient is requested to take Entresto 49/51 mg 2 tablets p.o. twice daily for 1 week and to have blood work done thereafter.  If her blood work is stable we will send in a new prescription for Entresto 97/103 mg p.o. twice daily.  I would like to see her back  in 1 month to further uptitrate her guideline directed medical therapy if she is able to tolerate it.  She is congratulated on her lifestyle modification changes and improving her caloric intake.  She has lost weight since last office visit for which she is congratulated for.  No recent hospitalization for congestive heart failure.  We will enroll her into remote patient monitoring and physical care management for congestive heart failure so that we can have a better evaluation of her blood pressures, and daily weights.  Dyspnea on exertion: Chronic, improving.  See above  Mixed hyperlipidemia: Most recent lipid profile reviewed.  Continue Livalo.  Patient does not endorse myalgias.  Right bundle branch block: Continue to monitor.  History of pulmonary embolism and deep venous thrombosis after surgery: Currently on oral anticoagulation.  Obesity, due to excess calories: Body mass index is 32.66 kg/m. . I reviewed with the patient the importance of diet, regular physical activity/exercise, weight loss.   . Patient is educated on increasing physical activity gradually as tolerated.  With the goal of moderate intensity exercise for 30 minutes a day 5 days a week.  FINAL MEDICATION LIST END OF ENCOUNTER: No orders of the defined types were placed in this encounter.    Current Outpatient Medications:  .  acetaminophen (TYLENOL) 650 MG CR tablet, Take 1,300 mg by mouth every 8 (eight) hours as needed for pain., Disp: , Rfl:  .  colchicine 0.6 MG tablet, Take 0.6 mg by mouth daily., Disp: , Rfl:  .  ENTRESTO 49-51 MG, TAKE 1 TABLET BY MOUTH TWICE A DAY, Disp: 180 tablet, Rfl: 0 .  gabapentin (NEURONTIN) 300 MG capsule, Take 300 mg by mouth in the morning and at bedtime. , Disp: , Rfl:  .  levothyroxine (SYNTHROID) 50 MCG tablet, Take 1 tablet by mouth daily., Disp: , Rfl:  .  metoprolol succinate (TOPROL XL) 25 MG 24 hr tablet, Take 1 tablet (25 mg total) by mouth daily., Disp: 90 tablet,  Rfl:  2 .  Multiple Vitamins-Minerals (CENTRUM SILVER PO), Take 1 tablet by mouth daily., Disp: , Rfl:  .  Pitavastatin Calcium (LIVALO) 2 MG TABS, Take 1 tablet (2 mg total) by mouth daily., Disp: 90 tablet, Rfl: 3 .  rivaroxaban (XARELTO) 20 MG TABS tablet, Take 1 tablet (20 mg total) by mouth daily with supper., Disp: 30 tablet, Rfl: 3  Orders Placed This Encounter  Procedures  . Basic metabolic panel  . Pro b natriuretic peptide (BNP)  . Magnesium  . EKG 12-Lead   --Continue cardiac medications as reconciled in final medication list. --Return in about 4 weeks (around 10/01/2020) for Follow up, heart failure management.. Or sooner if needed. --Continue follow-up with your primary care physician regarding the management of your other chronic comorbid conditions.  Patient's questions and concerns were addressed to her satisfaction. She voices understanding of the instructions provided during this encounter.   This note was created using a voice recognition software as a result there may be grammatical errors inadvertently enclosed that do not reflect the nature of this encounter. Every attempt is made to correct such errors.  Total time spent 30 minutes.  Rex Kras, [place], [facility]  Pager: [phone] Office: [phone]

## 2020-09-04 ENCOUNTER — Ambulatory Visit: Payer: Medicare HMO | Admitting: Cardiology

## 2020-09-04 DIAGNOSIS — M1712 Unilateral primary osteoarthritis, left knee: Secondary | ICD-10-CM | POA: Diagnosis not present

## 2020-09-04 DIAGNOSIS — M25562 Pain in left knee: Secondary | ICD-10-CM | POA: Diagnosis not present

## 2020-09-07 ENCOUNTER — Other Ambulatory Visit: Payer: Self-pay

## 2020-09-07 DIAGNOSIS — I5042 Chronic combined systolic (congestive) and diastolic (congestive) heart failure: Secondary | ICD-10-CM

## 2020-09-07 DIAGNOSIS — R0609 Other forms of dyspnea: Secondary | ICD-10-CM

## 2020-09-07 DIAGNOSIS — R0600 Dyspnea, unspecified: Secondary | ICD-10-CM

## 2020-09-08 DIAGNOSIS — I5022 Chronic systolic (congestive) heart failure: Secondary | ICD-10-CM | POA: Diagnosis not present

## 2020-09-11 ENCOUNTER — Other Ambulatory Visit: Payer: Self-pay

## 2020-09-11 DIAGNOSIS — R0600 Dyspnea, unspecified: Secondary | ICD-10-CM | POA: Diagnosis not present

## 2020-09-11 DIAGNOSIS — I5042 Chronic combined systolic (congestive) and diastolic (congestive) heart failure: Secondary | ICD-10-CM | POA: Diagnosis not present

## 2020-09-11 MED ORDER — ENTRESTO 49-51 MG PO TABS: 2.0000 | ORAL_TABLET | Freq: Two times a day (BID) | ORAL | 0 refills | Status: DC

## 2020-09-12 ENCOUNTER — Other Ambulatory Visit: Payer: Self-pay | Admitting: Cardiology

## 2020-09-12 DIAGNOSIS — I5042 Chronic combined systolic (congestive) and diastolic (congestive) heart failure: Secondary | ICD-10-CM

## 2020-09-12 LAB — BASIC METABOLIC PANEL
BUN/Creatinine Ratio: 17 (ref 12–28)
BUN: 16 mg/dL (ref 8–27)
CO2: 22 mmol/L (ref 20–29)
Calcium: 9.3 mg/dL (ref 8.7–10.3)
Chloride: 107 mmol/L — ABNORMAL HIGH (ref 96–106)
Creatinine, Ser: 0.96 mg/dL (ref 0.57–1.00)
Glucose: 111 mg/dL — ABNORMAL HIGH (ref 65–99)
Potassium: 4.2 mmol/L (ref 3.5–5.2)
Sodium: 147 mmol/L — ABNORMAL HIGH (ref 134–144)
eGFR: 57 mL/min/{1.73_m2} — ABNORMAL LOW (ref >59)

## 2020-09-12 LAB — MAGNESIUM: Magnesium: 2.1 mg/dL (ref 1.6–2.3)

## 2020-09-12 LAB — PRO B NATRIURETIC PEPTIDE: NT-Pro BNP: 620 pg/mL (ref 0–738)

## 2020-09-12 MED ORDER — ENTRESTO 97-103 MG PO TABS: 1.0000 | ORAL_TABLET | Freq: Two times a day (BID) | ORAL | 0 refills | Status: DC

## 2020-09-13 ENCOUNTER — Other Ambulatory Visit: Payer: Self-pay

## 2020-09-13 NOTE — Progress Notes (Signed)
Called and spoke with patient regarding her lab results and patient is aware to go pick up medication.

## 2020-10-01 ENCOUNTER — Encounter: Payer: Self-pay | Admitting: Cardiology

## 2020-10-01 ENCOUNTER — Other Ambulatory Visit: Payer: Self-pay

## 2020-10-01 ENCOUNTER — Ambulatory Visit: Payer: Medicare HMO | Admitting: Cardiology

## 2020-10-01 VITALS — BP 120/75 | HR 88 | Temp 98.4°F | Resp 16 | Ht 70.0 in | Wt 229.8 lb

## 2020-10-01 DIAGNOSIS — R0609 Other forms of dyspnea: Secondary | ICD-10-CM

## 2020-10-01 DIAGNOSIS — Z86711 Personal history of pulmonary embolism: Secondary | ICD-10-CM

## 2020-10-01 DIAGNOSIS — I5042 Chronic combined systolic (congestive) and diastolic (congestive) heart failure: Secondary | ICD-10-CM | POA: Diagnosis not present

## 2020-10-01 DIAGNOSIS — Z86718 Personal history of other venous thrombosis and embolism: Secondary | ICD-10-CM | POA: Diagnosis not present

## 2020-10-01 DIAGNOSIS — E782 Mixed hyperlipidemia: Secondary | ICD-10-CM | POA: Diagnosis not present

## 2020-10-01 DIAGNOSIS — R0600 Dyspnea, unspecified: Secondary | ICD-10-CM

## 2020-10-01 DIAGNOSIS — Z6832 Body mass index (BMI) 32.0-32.9, adult: Secondary | ICD-10-CM

## 2020-10-01 DIAGNOSIS — E6609 Other obesity due to excess calories: Secondary | ICD-10-CM

## 2020-10-01 DIAGNOSIS — I10 Essential (primary) hypertension: Secondary | ICD-10-CM

## 2020-10-01 DIAGNOSIS — I4510 Unspecified right bundle-branch block: Secondary | ICD-10-CM

## 2020-10-01 MED ORDER — DAPAGLIFLOZIN PROPANEDIOL 10 MG PO TABS: 10.0000 mg | ORAL_TABLET | Freq: Every day | ORAL | 0 refills | Status: DC

## 2020-10-01 NOTE — Patient Instructions (Signed)
Start Farxiga 10 mg p.o. every morning for 1 week.  1 week later check blood work at the nearest [facility] to check a kidney function and electrolytes.

## 2020-10-01 NOTE — Progress Notes (Signed)
Debbie Richards Date of Birth: [birth year] MRN: [id number] Primary Care Provider:Meyers, Annie Main, MD Former Cardiology Providers: Sinclair Grooms, MD, Sueanne Margarita, MD, Jeri Lager, APRN, FNP-C Primary Cardiologist: Rex Kras, DO, [facility] (established care 11/10/2019)  Date: 10/01/20 Last Office Visit: 09/03/2020  Chief Complaint  Patient presents with  . Follow-up    HPI  Debbie Richards is a 87 y.o.  female who presents to the office with a chief complaint of " 102-month follow-up for heart failure management."  Patient's past medical history and cardiovascular risk factors include: Chronic combined systolic and diastolic heart failure, stage B, NYHA class II/III, hypertension, hypothyroidism, prediabetes, diabetic neuropathy, familial Mediterranean fever, history of DVT, history of PE, postmenopausal female, advanced age.   Patient presents today for follow-up for management of congestive heart failure.  At last office visit we increased her Entresto dose to 49/51 mg 2 tablets twice daily.  Follow-up for blood work noted stable kidney function and electrolytes.  She is overall euvolemic and denies symptoms of orthopnea, paroxysmal nocturnal dyspnea.  She does have lower extremity swelling which is chronic and stable.  She has been unable to tolerate statin medications including Lipitor, Crestor, Zocor.  She is able to tolerate Livalo.   FUNCTIONAL STATUS: She able to garden and maintain her home. No structured exercise program or daily routine.    ALLERGIES: Allergies  Allergen Reactions  . Dilaudid [Hydromorphone Hcl] Other (See Comments)    Flushing, hallucinations  . Gabapentin Other (See Comments)    RLS with burning  . Lipitor [Atorvastatin Calcium] Other (See Comments)    Muscle aches   . Lovastatin Other (See Comments)    Muscle pain  . Zetia [Ezetimibe] Other (See Comments)    Muscles aches  . Zocor [Simvastatin] Other (See Comments)    Muscle Aches      MEDICATION LIST PRIOR TO VISIT: Current Outpatient Medications on File Prior to Visit  Medication Sig Dispense Refill  . acetaminophen (TYLENOL) 650 MG CR tablet Take 1,300 mg by mouth every 8 (eight) hours as needed for pain.    Marland Kitchen colchicine 0.6 MG tablet Take 0.6 mg by mouth daily.    Marland Kitchen gabapentin (NEURONTIN) 300 MG capsule Take 300 mg by mouth in the morning and at bedtime.     Marland Kitchen levothyroxine (SYNTHROID) 50 MCG tablet Take 1 tablet by mouth daily.    . metoprolol succinate (TOPROL XL) 25 MG 24 hr tablet Take 1 tablet (25 mg total) by mouth daily. 90 tablet 2  . Multiple Vitamins-Minerals (CENTRUM SILVER PO) Take 1 tablet by mouth daily.    . Pitavastatin Calcium (LIVALO) 2 MG TABS Take 1 tablet (2 mg total) by mouth daily. 90 tablet 3  . rivaroxaban (XARELTO) 20 MG TABS tablet Take 1 tablet (20 mg total) by mouth daily with supper. 30 tablet 3  . sacubitril-valsartan (ENTRESTO) 97-103 MG Take 1 tablet by mouth 2 (two) times daily. 180 tablet 0   No current facility-administered medications on file prior to visit.    PAST MEDICAL HISTORY: Past Medical History:  Diagnosis Date  . Borderline diabetes   . Chronic back pain   . Chronic lower back pain   . Clotting disorder ([place])   . Deep vein thrombosis (DVT) ([place])   . DVT, lower extremity ([place]) 01/2002; 06/2003   RLE; LLE  . Enlarged thyroid gland   . Familial Mediterranean fever ([place])   . Fibromyalgia   . Fibromyalgia   . Gallstones   .  GERD (gastroesophageal reflux disease)   . GERD (gastroesophageal reflux disease)   . H/O hiatal hernia   . History of stomach ulcers   . Hyperlipidemia   . Hypertension   . Hypothyroidism   . Insomnia   . Migraine    "I've had 4; last time was when I was in my ?30's"  . Osteoarthritis   . Phlebitis    LLE  . Pneumonia   . Pulmonary embolism ([place]) 2003  . Shortness of breath on exertion   . Tinnitus     PAST SURGICAL HISTORY: Past Surgical History:  Procedure Laterality Date   . ABDOMINAL HYSTERECTOMY  1972  . APPENDECTOMY  1960  . bladder tack    . BREAST BIOPSY     left  . [place]   right  . CATARACT EXTRACTION, BILATERAL  ~ 2011  . CHOLECYSTECTOMY  09/12/2011   Procedure: LAPAROSCOPIC CHOLECYSTECTOMY WITH INTRAOPERATIVE CHOLANGIOGRAM;  Surgeon: Shann Medal, MD;  Location: [facility];  Service: General;  Laterality: N/A;  . excision of goiter    . EXTERNAL EAR SURGERY     x3  . HERNIA REPAIR  1970's   ventral  . KNEE ARTHROSCOPY Left 12/05/2019   Procedure: ARTHROSCOPY KNEE AND DEBRIDEMENT, partial medial and lateral meniscectomy, left femoral patella chondroplasty;  Surgeon: Dorna Leitz, MD;  Location: [facility];  Service: Orthopedics;  Laterality: Left;  Marland Kitchen MASS EXCISION  12/2001   paravertebral chest mass  . [place]; 1982; after 1982  . mini thoracotomy  12/2001  . THORACOTOMY  2003   procedure done   to remove growth in lung   . TOTAL KNEE ARTHROPLASTY Right 01/30/2017   Procedure: RIGHT TOTAL KNEE ARTHROPLASTY;  Surgeon: Dorna Leitz, MD;  Location: [facility];  Service: Orthopedics;  Laterality: Right;  With adductor canal block    FAMILY HISTORY: The patient's family history includes Dementia in her sister; Heart disease in her mother; Hyperlipidemia in her mother; Hypertension in her mother; Parkinson's disease in her brother, sister, and sister; Stroke in her father.   SOCIAL HISTORY:  The patient  reports that she has never smoked. She has never used smokeless tobacco. She reports that she does not drink alcohol and does not use drugs.  Review of Systems  Constitutional: Negative for chills, fever and malaise/fatigue.  HENT: Negative for hoarse voice and nosebleeds.   Eyes: Negative for discharge, double vision and pain.  Cardiovascular: Positive for dyspnea on exertion (improving) and leg swelling (improving). Negative for chest pain, claudication, near-syncope, orthopnea, palpitations, paroxysmal  nocturnal dyspnea and syncope.  Respiratory: Negative for hemoptysis and shortness of breath.   Musculoskeletal: Positive for joint pain. Negative for muscle cramps and myalgias.  Gastrointestinal: Negative for abdominal pain, constipation, diarrhea, hematemesis, hematochezia, melena, nausea and vomiting.  Neurological: Negative for dizziness and light-headedness.    PHYSICAL EXAM: Vitals with BMI 10/01/2020 09/03/2020 03/07/2020  Height 5\' 10"  5\' 10"  5\' 10"   Weight 229 lbs 13 oz 227 lbs 10 oz 236 lbs  BMI 32.97 61.95 09.32  Systolic 671 245 809  Diastolic 75 68 63  Pulse 88 88 95    CONSTITUTIONAL: Well-developed and well-nourished. No acute distress.  Ambulates with a cane SKIN: Skin is warm and dry. No rash noted. No cyanosis. No pallor. No jaundice HEAD: Normocephalic and atraumatic.  EYES: No scleral icterus MOUTH/THROAT: Moist oral membranes.  NECK: No JVD present. No thyromegaly noted. No carotid bruits  LYMPHATIC:  No visible cervical adenopathy.  CHEST Normal respiratory effort. No intercostal retractions  LUNGS: Clear to auscultation bilaterally.  No stridor. No wheezes. No rales.  CARDIOVASCULAR: Regular rate and rhythm, positive S1-S2, no murmurs rubs or gallops appreciated ABDOMINAL: .Obese, soft, nontender, nondistended, positive bowel sounds all 4 quadrants.No apparent ascites.  EXTREMITIES: Bilateral +1 peripheral edema, warm to touch, skin changes suggestive of chronic venous stasis. HEMATOLOGIC: No significant bruising NEUROLOGIC: Oriented to person, place, and time. Nonfocal. Normal muscle tone.  PSYCHIATRIC: Normal mood and affect. Normal behavior. Cooperative  CARDIAC DATABASE: EKG: 09/03/2020: Normal sinus rhythm, 81 bpm, right bundle branch block, occasional PVCs.    Echocardiogram: 09/2011: LVEF 55-60% mildly dilated left atrium, PASP 33 mmHg.  11/14/2019:  Left ventricle cavity is normal in size. Mild concentric hypertrophy of the left ventricle. Mild global  hypokinesis. LVEF 45-50%. Doppler evidence of grade II (pseudonormal) diastolic dysfunction, elevated LAP.  Left atrial cavity is severely dilated at 51 cc/cm2.  Mild (Grade I) mitral regurgitation.  Mild tricuspid regurgitation. Estimated pulmonary artery systolic pressure is 21 mmHg.   Stress Testing:  Lexiscan/modified Bruce Tetrofosmin stress test 11/23/2019:  Lexiscan nuclear stress test performed using 1-day protocol. Stress EKG is non-diagnostic, as this is pharmacological stress test. In addition, rest and stress EKG showed sinus rhythm, anterosetpal nonspecific T wave inversion, frequent PVC's.  Decreased counts in inferior/inferolateral myocardium seen worse at rest, likely due to breast tissue attenuation with images performed in sitting position. Stress LVEF 48%.  Intermediate risk study. Recommend clinical correlation.    Heart Catheterization: None  LABORATORY DATA: CBC Latest Ref Rng & Units 02/02/2017 02/01/2017 01/31/2017  WBC 4.0 - 10.5 K/uL 7.9 13.6(H) 12.4(H)  Hemoglobin 12.0 - 15.0 g/dL 11.8(L) 12.4 12.3  Hematocrit 36.0 - 46.0 % 35.4(L) 37.9 37.5  Platelets 150 - 400 K/uL 123(L) 137(L) 122(L)    CMP Latest Ref Rng & Units 09/11/2020 02/21/2020 02/09/2020  Glucose 65 - 99 mg/dL 111(H) 124(H) 135(H)  BUN 8 - 27 mg/dL 16 8 13   Creatinine 0.57 - 1.00 mg/dL 0.96 0.91 0.90  Sodium 134 - 144 mmol/L 147(H) 141 142  Potassium 3.5 - 5.2 mmol/L 4.2 4.8 5.4(H)  Chloride 96 - 106 mmol/L 107(H) 103 104  CO2 20 - 29 mmol/L 22 24 24   Calcium 8.7 - 10.3 mg/dL 9.3 9.7 9.5  Total Protein 6.5 - 8.1 g/dL - - -  Total Bilirubin 0.3 - 1.2 mg/dL - - -  Alkaline Phos 38 - 126 U/L - - -  AST 15 - 41 U/L - - -  ALT 14 - 54 U/L - - -    Lipid Panel     Component Value Date/Time   CHOL 180 07/12/2018 1251   TRIG 130 07/12/2018 1251   HDL 62 07/12/2018 1251   LDLCALC 92 07/12/2018 1251   LABVLDL 26 07/12/2018 1251    Lab Results  Component Value Date   HGBA1C 5.8 (H) 01/23/2017    No components found for: NTPROBNP No results found for: TSH  Cardiac Panel (last 3 results) No results for input(s): CKTOTAL, CKMB, TROPONINIHS, RELINDX in the last 72 hours.  IMPRESSION:    ICD-10-CM   1. Chronic combined systolic and diastolic heart failure (HCC)  I50.42 dapagliflozin propanediol (FARXIGA) 10 MG TABS tablet    Basic metabolic panel    Magnesium    Pro b natriuretic peptide (BNP)  2. Dyspnea on exertion  P10.25 Basic metabolic panel    Magnesium    Pro b natriuretic  peptide (BNP)  3. Right bundle branch block  I45.10   4. Mixed hyperlipidemia  E78.2   5. Essential hypertension  I10   6. Hx pulmonary embolism  Z86.711   7. Hx of deep venous thrombosis  Z86.718   8. Class 1 obesity due to excess calories without serious comorbidity with body mass index (BMI) of 32.0 to 32.9 in adult  E66.09    Z68.32      RECOMMENDATIONS: Debbie Richards is a 87 y.o. female whose past medical history and cardiovascular risk factors include: Chronic combined systolic and diastolic heart failure, stage B, NYHA class II/III, hypertension, hypothyroidism, prediabetes, diabetic neuropathy, familial Mediterranean fever, history of DVT, history of PE, postmenopausal female, advanced age.   Chronic combined systolic and diastolic heart failure/stage B/NYHA class II/III:  Medications reconciled.  Patient states that she has at least 3 months worth of supply of Entresto 49/51 mg tablets.  For now she is requested to take 2 tablets in the morning and 2 tablets at evening as her blood work is stable.  When she is due for refill we will change her prescription to Entresto 97/103 mg p.o. twice daily.    Will start Farxiga 10 mg p.o. daily.  Blood work in 1 week to reevaluate kidney function and electrolytes.  I spoke to the patient's daughter Selena BattenKim over the phone after the office visit and conveyed the recommendations as part of closed-loop indication.  No recent hospitalization for  congestive heart failure.  Patient is enrolled into preserved care management for her underlying congestive heart failure.  Blood pressure remained stable, heart rate and weight within acceptable range.    Dyspnea on exertion: Chronic, improving.  See above  Mixed hyperlipidemia: Most recent lipid profile reviewed.  Continue Livalo.  Patient does not endorse myalgias.  Right bundle branch block: Continue to monitor.  History of pulmonary embolism and deep venous thrombosis after surgery: Currently on oral anticoagulation.  Obesity, due to excess calories: Body mass index is 32.97 kg/m. . I reviewed with the patient the importance of diet, regular physical activity/exercise, weight loss.   . Patient is educated on increasing physical activity gradually as tolerated.  With the goal of moderate intensity exercise for 30 minutes a day 5 days a week.  FINAL MEDICATION LIST END OF ENCOUNTER: Meds ordered this encounter  Medications  . dapagliflozin propanediol (FARXIGA) 10 MG TABS tablet    Sig: Take 1 tablet (10 mg total) by mouth daily before breakfast.    Dispense:  90 tablet    Refill:  0     Current Outpatient Medications:  .  acetaminophen (TYLENOL) 650 MG CR tablet, Take 1,300 mg by mouth every 8 (eight) hours as needed for pain., Disp: , Rfl:  .  colchicine 0.6 MG tablet, Take 0.6 mg by mouth daily., Disp: , Rfl:  .  dapagliflozin propanediol (FARXIGA) 10 MG TABS tablet, Take 1 tablet (10 mg total) by mouth daily before breakfast., Disp: 90 tablet, Rfl: 0 .  gabapentin (NEURONTIN) 300 MG capsule, Take 300 mg by mouth in the morning and at bedtime. , Disp: , Rfl:  .  levothyroxine (SYNTHROID) 50 MCG tablet, Take 1 tablet by mouth daily., Disp: , Rfl:  .  metoprolol succinate (TOPROL XL) 25 MG 24 hr tablet, Take 1 tablet (25 mg total) by mouth daily., Disp: 90 tablet, Rfl: 2 .  Multiple Vitamins-Minerals (CENTRUM SILVER PO), Take 1 tablet by mouth daily., Disp: , Rfl:  .   Pitavastatin  Calcium (LIVALO) 2 MG TABS, Take 1 tablet (2 mg total) by mouth daily., Disp: 90 tablet, Rfl: 3 .  rivaroxaban (XARELTO) 20 MG TABS tablet, Take 1 tablet (20 mg total) by mouth daily with supper., Disp: 30 tablet, Rfl: 3 .  sacubitril-valsartan (ENTRESTO) 97-103 MG, Take 1 tablet by mouth 2 (two) times daily., Disp: 180 tablet, Rfl: 0  Orders Placed This Encounter  Procedures  . Basic metabolic panel  . Magnesium  . Pro b natriuretic peptide (BNP)   --Continue cardiac medications as reconciled in final medication list. --Return in about 3 months (around 12/31/2020) for Follow up, heart failure management.. Or sooner if needed. --Continue follow-up with your primary care physician regarding the management of your other chronic comorbid conditions.  Patient's questions and concerns were addressed to her satisfaction. She voices understanding of the instructions provided during this encounter.   This note was created using a voice recognition software as a result there may be grammatical errors inadvertently enclosed that do not reflect the nature of this encounter. Every attempt is made to correct such errors.  Total time spent 30 minutes.  Rex Kras, [place], [facility]  Pager: [phone] Office: [phone]

## 2020-10-04 ENCOUNTER — Other Ambulatory Visit: Payer: Self-pay

## 2020-10-04 DIAGNOSIS — K219 Gastro-esophageal reflux disease without esophagitis: Secondary | ICD-10-CM | POA: Diagnosis not present

## 2020-10-04 DIAGNOSIS — R0609 Other forms of dyspnea: Secondary | ICD-10-CM

## 2020-10-04 DIAGNOSIS — Z79899 Other long term (current) drug therapy: Secondary | ICD-10-CM | POA: Diagnosis not present

## 2020-10-04 DIAGNOSIS — I482 Chronic atrial fibrillation, unspecified: Secondary | ICD-10-CM | POA: Diagnosis not present

## 2020-10-04 DIAGNOSIS — Z0000 Encounter for general adult medical examination without abnormal findings: Secondary | ICD-10-CM | POA: Diagnosis not present

## 2020-10-04 DIAGNOSIS — E850 Non-neuropathic heredofamilial amyloidosis: Secondary | ICD-10-CM | POA: Diagnosis not present

## 2020-10-04 DIAGNOSIS — E042 Nontoxic multinodular goiter: Secondary | ICD-10-CM | POA: Diagnosis not present

## 2020-10-04 DIAGNOSIS — D6869 Other thrombophilia: Secondary | ICD-10-CM | POA: Diagnosis not present

## 2020-10-04 DIAGNOSIS — I5042 Chronic combined systolic (congestive) and diastolic (congestive) heart failure: Secondary | ICD-10-CM | POA: Diagnosis not present

## 2020-10-04 DIAGNOSIS — I10 Essential (primary) hypertension: Secondary | ICD-10-CM | POA: Diagnosis not present

## 2020-10-04 DIAGNOSIS — R0600 Dyspnea, unspecified: Secondary | ICD-10-CM

## 2020-10-04 DIAGNOSIS — G629 Polyneuropathy, unspecified: Secondary | ICD-10-CM | POA: Diagnosis not present

## 2020-10-04 DIAGNOSIS — E7800 Pure hypercholesterolemia, unspecified: Secondary | ICD-10-CM | POA: Diagnosis not present

## 2020-10-08 DIAGNOSIS — I10 Essential (primary) hypertension: Secondary | ICD-10-CM | POA: Diagnosis not present

## 2020-10-09 DIAGNOSIS — R0600 Dyspnea, unspecified: Secondary | ICD-10-CM | POA: Diagnosis not present

## 2020-10-09 DIAGNOSIS — I5042 Chronic combined systolic (congestive) and diastolic (congestive) heart failure: Secondary | ICD-10-CM | POA: Diagnosis not present

## 2020-10-10 LAB — MAGNESIUM: Magnesium: 2.1 mg/dL (ref 1.6–2.3)

## 2020-10-10 LAB — BASIC METABOLIC PANEL
BUN/Creatinine Ratio: 13 (ref 12–28)
BUN: 14 mg/dL (ref 8–27)
CO2: 22 mmol/L (ref 20–29)
Calcium: 9.8 mg/dL (ref 8.7–10.3)
Chloride: 107 mmol/L — ABNORMAL HIGH (ref 96–106)
Creatinine, Ser: 1.04 mg/dL — ABNORMAL HIGH (ref 0.57–1.00)
Glucose: 114 mg/dL — ABNORMAL HIGH (ref 65–99)
Potassium: 5.3 mmol/L — ABNORMAL HIGH (ref 3.5–5.2)
Sodium: 145 mmol/L — ABNORMAL HIGH (ref 134–144)
eGFR: 52 mL/min/{1.73_m2} — ABNORMAL LOW (ref >59)

## 2020-10-10 LAB — PRO B NATRIURETIC PEPTIDE: NT-Pro BNP: 429 pg/mL (ref 0–738)

## 2020-10-12 ENCOUNTER — Other Ambulatory Visit: Payer: Self-pay

## 2020-10-12 DIAGNOSIS — R0600 Dyspnea, unspecified: Secondary | ICD-10-CM

## 2020-10-12 DIAGNOSIS — I5042 Chronic combined systolic (congestive) and diastolic (congestive) heart failure: Secondary | ICD-10-CM

## 2020-10-12 DIAGNOSIS — R0609 Other forms of dyspnea: Secondary | ICD-10-CM

## 2020-10-12 DIAGNOSIS — I10 Essential (primary) hypertension: Secondary | ICD-10-CM

## 2020-10-12 NOTE — Progress Notes (Signed)
Spoke to patient's daughter Debbie Richards she is aware

## 2020-10-23 ENCOUNTER — Other Ambulatory Visit: Payer: Self-pay | Admitting: Cardiology

## 2020-10-23 DIAGNOSIS — I5042 Chronic combined systolic (congestive) and diastolic (congestive) heart failure: Secondary | ICD-10-CM

## 2020-10-23 DIAGNOSIS — I10 Essential (primary) hypertension: Secondary | ICD-10-CM

## 2020-10-29 DIAGNOSIS — I10 Essential (primary) hypertension: Secondary | ICD-10-CM | POA: Diagnosis not present

## 2020-10-29 DIAGNOSIS — I5042 Chronic combined systolic (congestive) and diastolic (congestive) heart failure: Secondary | ICD-10-CM | POA: Diagnosis not present

## 2020-10-30 ENCOUNTER — Other Ambulatory Visit: Payer: Self-pay

## 2020-10-30 DIAGNOSIS — B029 Zoster without complications: Secondary | ICD-10-CM | POA: Diagnosis not present

## 2020-10-30 DIAGNOSIS — I10 Essential (primary) hypertension: Secondary | ICD-10-CM

## 2020-10-30 DIAGNOSIS — I5042 Chronic combined systolic (congestive) and diastolic (congestive) heart failure: Secondary | ICD-10-CM

## 2020-10-30 LAB — BASIC METABOLIC PANEL
BUN/Creatinine Ratio: 14 (ref 12–28)
BUN: 22 mg/dL (ref 8–27)
CO2: 22 mmol/L (ref 20–29)
Calcium: 9.5 mg/dL (ref 8.7–10.3)
Chloride: 105 mmol/L (ref 96–106)
Creatinine, Ser: 1.6 mg/dL — ABNORMAL HIGH (ref 0.57–1.00)
Glucose: 107 mg/dL — ABNORMAL HIGH (ref 65–99)
Potassium: 4.8 mmol/L (ref 3.5–5.2)
Sodium: 142 mmol/L (ref 134–144)
eGFR: 31 mL/min/{1.73_m2} — ABNORMAL LOW (ref >59)

## 2020-10-30 NOTE — Progress Notes (Signed)
Spoke to patient's daughter she voiced understanding. Labs have been ordered and released

## 2020-11-07 DIAGNOSIS — I5022 Chronic systolic (congestive) heart failure: Secondary | ICD-10-CM | POA: Diagnosis not present

## 2020-11-07 DIAGNOSIS — I10 Essential (primary) hypertension: Secondary | ICD-10-CM | POA: Diagnosis not present

## 2020-11-07 DIAGNOSIS — I5042 Chronic combined systolic (congestive) and diastolic (congestive) heart failure: Secondary | ICD-10-CM | POA: Diagnosis not present

## 2020-11-08 ENCOUNTER — Other Ambulatory Visit: Payer: Self-pay

## 2020-11-08 ENCOUNTER — Telehealth: Payer: Self-pay

## 2020-11-08 DIAGNOSIS — I5042 Chronic combined systolic (congestive) and diastolic (congestive) heart failure: Secondary | ICD-10-CM

## 2020-11-08 LAB — BASIC METABOLIC PANEL
BUN/Creatinine Ratio: 17 (ref 12–28)
BUN: 21 mg/dL (ref 8–27)
CO2: 22 mmol/L (ref 20–29)
Calcium: 9.7 mg/dL (ref 8.7–10.3)
Chloride: 103 mmol/L (ref 96–106)
Creatinine, Ser: 1.27 mg/dL — ABNORMAL HIGH (ref 0.57–1.00)
Glucose: 107 mg/dL — ABNORMAL HIGH (ref 65–99)
Potassium: 4.9 mmol/L (ref 3.5–5.2)
Sodium: 140 mmol/L (ref 134–144)
eGFR: 41 mL/min/{1.73_m2} — ABNORMAL LOW (ref >59)

## 2020-11-08 LAB — MAGNESIUM: Magnesium: 2 mg/dL (ref 1.6–2.3)

## 2020-11-08 MED ORDER — ENTRESTO 97-103 MG PO TABS: 1.0000 | ORAL_TABLET | Freq: Two times a day (BID) | ORAL | 3 refills | Status: DC

## 2020-11-08 NOTE — Telephone Encounter (Signed)
Rx has been sent and patient is aware

## 2020-11-08 NOTE — Progress Notes (Signed)
Called patient, NA, LMAM

## 2020-11-08 NOTE — Telephone Encounter (Signed)
Refill Entresto 97/103mg   po bid

## 2020-11-08 NOTE — Progress Notes (Signed)
Patient called back I gave results she voiced understanding. Patient stated she is not on Farxiga because you told her to stop it

## 2020-11-15 DIAGNOSIS — G8929 Other chronic pain: Secondary | ICD-10-CM | POA: Diagnosis not present

## 2020-11-15 DIAGNOSIS — G629 Polyneuropathy, unspecified: Secondary | ICD-10-CM | POA: Diagnosis not present

## 2020-11-15 DIAGNOSIS — M109 Gout, unspecified: Secondary | ICD-10-CM | POA: Diagnosis not present

## 2020-11-15 DIAGNOSIS — Z008 Encounter for other general examination: Secondary | ICD-10-CM | POA: Diagnosis not present

## 2020-11-15 DIAGNOSIS — M1990 Unspecified osteoarthritis, unspecified site: Secondary | ICD-10-CM | POA: Diagnosis not present

## 2020-11-15 DIAGNOSIS — E669 Obesity, unspecified: Secondary | ICD-10-CM | POA: Diagnosis not present

## 2020-11-15 DIAGNOSIS — E785 Hyperlipidemia, unspecified: Secondary | ICD-10-CM | POA: Diagnosis not present

## 2020-11-15 DIAGNOSIS — I509 Heart failure, unspecified: Secondary | ICD-10-CM | POA: Diagnosis not present

## 2020-11-15 DIAGNOSIS — I110 Hypertensive heart disease with heart failure: Secondary | ICD-10-CM | POA: Diagnosis not present

## 2020-11-15 DIAGNOSIS — E039 Hypothyroidism, unspecified: Secondary | ICD-10-CM | POA: Diagnosis not present

## 2020-11-15 DIAGNOSIS — R32 Unspecified urinary incontinence: Secondary | ICD-10-CM | POA: Diagnosis not present

## 2020-12-04 DIAGNOSIS — L0231 Cutaneous abscess of buttock: Secondary | ICD-10-CM | POA: Diagnosis not present

## 2020-12-04 DIAGNOSIS — L089 Local infection of the skin and subcutaneous tissue, unspecified: Secondary | ICD-10-CM | POA: Diagnosis not present

## 2020-12-06 ENCOUNTER — Telehealth: Payer: Self-pay

## 2020-12-06 DIAGNOSIS — I10 Essential (primary) hypertension: Secondary | ICD-10-CM | POA: Diagnosis not present

## 2020-12-06 DIAGNOSIS — E079 Disorder of thyroid, unspecified: Secondary | ICD-10-CM | POA: Diagnosis not present

## 2020-12-06 DIAGNOSIS — I4510 Unspecified right bundle-branch block: Secondary | ICD-10-CM | POA: Diagnosis not present

## 2020-12-06 DIAGNOSIS — R519 Headache, unspecified: Secondary | ICD-10-CM | POA: Diagnosis not present

## 2020-12-06 DIAGNOSIS — M19012 Primary osteoarthritis, left shoulder: Secondary | ICD-10-CM | POA: Diagnosis not present

## 2020-12-06 DIAGNOSIS — S0990XA Unspecified injury of head, initial encounter: Secondary | ICD-10-CM | POA: Diagnosis not present

## 2020-12-06 DIAGNOSIS — W19XXXA Unspecified fall, initial encounter: Secondary | ICD-10-CM | POA: Diagnosis not present

## 2020-12-06 DIAGNOSIS — Z86718 Personal history of other venous thrombosis and embolism: Secondary | ICD-10-CM | POA: Diagnosis not present

## 2020-12-06 DIAGNOSIS — I5022 Chronic systolic (congestive) heart failure: Secondary | ICD-10-CM | POA: Diagnosis not present

## 2020-12-06 DIAGNOSIS — R7989 Other specified abnormal findings of blood chemistry: Secondary | ICD-10-CM | POA: Diagnosis not present

## 2020-12-06 DIAGNOSIS — K219 Gastro-esophageal reflux disease without esophagitis: Secondary | ICD-10-CM | POA: Diagnosis not present

## 2020-12-06 DIAGNOSIS — R609 Edema, unspecified: Secondary | ICD-10-CM | POA: Diagnosis not present

## 2020-12-06 DIAGNOSIS — R748 Abnormal levels of other serum enzymes: Secondary | ICD-10-CM | POA: Diagnosis not present

## 2020-12-06 DIAGNOSIS — S40012A Contusion of left shoulder, initial encounter: Secondary | ICD-10-CM | POA: Diagnosis not present

## 2020-12-06 NOTE — Telephone Encounter (Signed)
I spoke with patient's daughter [name]. Suspects patient's BP was 83/40 mmHg prior to taking medications this morning. Patient's daughter is not with patient at the time of call so unable to discuss patient's present condition. Advised patient's daughter to have patient hold antihypertensive medications today as well as reduce Entresto from 2 tablets to 1 tablet twice daily. [name] will verify patient's prescriptions and call the office to update [place] on her mother when she gets to patient's home this afternoon.

## 2020-12-06 NOTE — Telephone Encounter (Signed)
Called and discussed with pt earlier today. Pt reported that she had a fall yesterday while stepping down her steps. Reported that she fell down face first and reports that she might have hit her head on the grass. Stayed on the floor for ~30 mins before she was able to get help. EMS was called per pt and pt never went to the hospital for further evaluation. BP this morning of 83/95 after taking her morning Entresto. Pt denies any complains of lightheadedness, dizziness. Reports that she didn't feel any syncope like symptoms prior to the fall. Reported that she just missed the step and fell forward. Reports to have mild bruising on her arms and body from the fall. Denies any significant bleeding concerns. Pt currently on Xarelto 20 mg daily for hx of PE/DVT. In setting of recent fall and concerns of possible head trauma, to rule out possible cranial hemorrhage, recommended pt go to the ER to be assessed. Pt reports that she will discuss with her daughter later this afternoon and will go to the ER to be evaluated. Recommended pt to increase her hydration for the meantime to help with her soft BP today

## 2020-12-07 NOTE — Telephone Encounter (Signed)
Agree, I would recommend ER evaluation if she had injuries s/p fall as she is on anticoagulation.   Can you review her BP and consider decreasing Entresto if clinically appropriate?  ST

## 2020-12-11 DIAGNOSIS — Z9181 History of falling: Secondary | ICD-10-CM | POA: Diagnosis not present

## 2020-12-11 DIAGNOSIS — I5022 Chronic systolic (congestive) heart failure: Secondary | ICD-10-CM | POA: Diagnosis not present

## 2020-12-31 ENCOUNTER — Ambulatory Visit: Payer: Medicare HMO | Admitting: Cardiology

## 2020-12-31 ENCOUNTER — Encounter: Payer: Self-pay | Admitting: Cardiology

## 2020-12-31 ENCOUNTER — Other Ambulatory Visit: Payer: Self-pay

## 2020-12-31 VITALS — BP 132/68 | HR 81 | Temp 97.3°F | Resp 17 | Ht 70.0 in | Wt 213.0 lb

## 2020-12-31 DIAGNOSIS — I5042 Chronic combined systolic (congestive) and diastolic (congestive) heart failure: Secondary | ICD-10-CM | POA: Diagnosis not present

## 2020-12-31 DIAGNOSIS — I4510 Unspecified right bundle-branch block: Secondary | ICD-10-CM | POA: Diagnosis not present

## 2020-12-31 DIAGNOSIS — Z86711 Personal history of pulmonary embolism: Secondary | ICD-10-CM | POA: Diagnosis not present

## 2020-12-31 DIAGNOSIS — I10 Essential (primary) hypertension: Secondary | ICD-10-CM

## 2020-12-31 DIAGNOSIS — E782 Mixed hyperlipidemia: Secondary | ICD-10-CM | POA: Diagnosis not present

## 2020-12-31 DIAGNOSIS — E6609 Other obesity due to excess calories: Secondary | ICD-10-CM

## 2020-12-31 DIAGNOSIS — Z6832 Body mass index (BMI) 32.0-32.9, adult: Secondary | ICD-10-CM

## 2020-12-31 DIAGNOSIS — R0609 Other forms of dyspnea: Secondary | ICD-10-CM | POA: Diagnosis not present

## 2020-12-31 DIAGNOSIS — Z86718 Personal history of other venous thrombosis and embolism: Secondary | ICD-10-CM

## 2020-12-31 DIAGNOSIS — R0600 Dyspnea, unspecified: Secondary | ICD-10-CM

## 2020-12-31 MED ORDER — ENTRESTO 49-51 MG PO TABS: 1.0000 | ORAL_TABLET | Freq: Two times a day (BID) | ORAL | 0 refills | Status: DC

## 2020-12-31 NOTE — Progress Notes (Signed)
Debbie Richards Date of Birth: May 29, 1931 MRN: [id number] Primary Care Provider:Meyers, Annie Main, MD Former Cardiology Providers: Sinclair Grooms, MD, Sueanne Margarita, MD, Jeri Lager, APRN, FNP-C Primary Cardiologist: Rex Kras, DO, [facility] (established care 11/10/2019)  Date: 12/31/20 Last Office Visit: 10/01/2020   Chief Complaint  Patient presents with   Congestive Heart Failure   Follow-up    3 month    HPI  Debbie Richards is a 87 y.o.  female who presents to the office with a chief complaint of " heart failure management 30-monthfollow-up."  Patient's past medical history and cardiovascular risk factors include: Chronic combined systolic and diastolic heart failure, stage B, NYHA class II, hypertension, hypothyroidism, prediabetes, diabetic neuropathy, familial Mediterranean fever, history of DVT, history of PE, postmenopausal female, advanced age.   Patient is accompanied with her daughter Debbie Richards today's office visit.  She is being managed for HFpEF and comes in today for 326-monthollow-up visit.  Since last office visit she is doing well from a cardiovascular standpoint.  She denies any chest pain and her shortness of breath is relatively stable.  She has not had any hospitalizations for congestive heart failure or ACS since last office encounter.  She has lost 16 pounds due to lifestyle changes over the last 3 months she is congratulated on her efforts.  In the interim she has had episodes of feeling tired, fatigued, lightheaded, dizziness.  Medications reconciled.  At the last office visit she was started on Farxiga but the medication was held secondary to lightheaded/dizziness, and acute kidney injury.  She has been unable to tolerate statin medications including Lipitor, Crestor, Zocor.  She is able to tolerate Livalo.   FUNCTIONAL STATUS: She able to garden and maintain her home. No structured exercise program or daily routine.    ALLERGIES: Allergies  Allergen  Reactions   Dilaudid [Hydromorphone Hcl] Other (See Comments)    Flushing, hallucinations   Gabapentin Other (See Comments)    RLS with burning   Lipitor [Atorvastatin Calcium] Other (See Comments)    Muscle aches    Lovastatin Other (See Comments)    Muscle pain   Zetia [Ezetimibe] Other (See Comments)    Muscles aches   Zocor [Simvastatin] Other (See Comments)    Muscle Aches     MEDICATION LIST PRIOR TO VISIT: Current Outpatient Medications on File Prior to Visit  Medication Sig Dispense Refill   acetaminophen (TYLENOL) 650 MG CR tablet Take 1,300 mg by mouth every 8 (eight) hours as needed for pain.     colchicine 0.6 MG tablet Take 0.6 mg by mouth daily.     gabapentin (NEURONTIN) 300 MG capsule Take 300 mg by mouth in the morning and at bedtime.      levothyroxine (SYNTHROID) 50 MCG tablet Take 1 tablet by mouth daily.     metoprolol succinate (TOPROL XL) 25 MG 24 hr tablet Take 1 tablet (25 mg total) by mouth daily. 90 tablet 2   Multiple Vitamins-Minerals (CENTRUM SILVER PO) Take 1 tablet by mouth daily.     Pitavastatin Calcium (LIVALO) 2 MG TABS Take 1 tablet (2 mg total) by mouth daily. 90 tablet 3   Rivaroxaban (XARELTO) 15 MG TABS tablet Take 15 mg by mouth daily with supper.     No current facility-administered medications on file prior to visit.    PAST MEDICAL HISTORY: Past Medical History:  Diagnosis Date   Borderline diabetes    Chronic back pain  Chronic lower back pain    Clotting disorder (HCC)    Deep vein thrombosis (DVT) ([place])    DVT, lower extremity ([place]) 01/2002; 06/2003   RLE; LLE   Enlarged thyroid gland    Familial Mediterranean fever (HCC)    Fibromyalgia    Fibromyalgia    Gallstones    GERD (gastroesophageal reflux disease)    GERD (gastroesophageal reflux disease)    H/O hiatal hernia    History of stomach ulcers    Hyperlipidemia    Hypertension    Hypothyroidism    Insomnia    Migraine    "I've had 4; last time was when I was  in my ?60's"   Osteoarthritis    Phlebitis    LLE   Pneumonia    Pulmonary embolism ([place]) 2003   Shortness of breath on exertion    Tinnitus     PAST SURGICAL HISTORY: Past Surgical History:  Procedure Laterality Date   ABDOMINAL HYSTERECTOMY  1972   APPENDECTOMY  1960   bladder tack     BREAST BIOPSY     left   CARPAL TUNNEL RELEASE  1989   right   CATARACT EXTRACTION, BILATERAL  ~ 2011   CHOLECYSTECTOMY  09/12/2011   Procedure: LAPAROSCOPIC CHOLECYSTECTOMY WITH INTRAOPERATIVE CHOLANGIOGRAM;  Surgeon: Shann Medal, MD;  Location: [place];  Service: General;  Laterality: N/A;   excision of goiter     EXTERNAL EAR SURGERY     x3   HERNIA REPAIR  1970's   ventral   KNEE ARTHROSCOPY Left 12/05/2019   Procedure: ARTHROSCOPY KNEE AND DEBRIDEMENT, partial medial and lateral meniscectomy, left femoral patella chondroplasty;  Surgeon: Dorna Leitz, MD;  Location: [place];  Service: Orthopedics;  Laterality: Left;   MASS EXCISION  12/2001   paravertebral chest mass   MIDDLE EAR SURGERY  1959; 1982; after 1982   mini thoracotomy  12/2001   THORACOTOMY  2003   procedure done   to remove growth in lung    TOTAL KNEE ARTHROPLASTY Right 01/30/2017   Procedure: RIGHT TOTAL KNEE ARTHROPLASTY;  Surgeon: Dorna Leitz, MD;  Location: [facility];  Service: Orthopedics;  Laterality: Right;  With adductor canal block    FAMILY HISTORY: The patient's family history includes Dementia in her sister; Heart disease in her mother; Hyperlipidemia in her mother; Hypertension in her mother; Parkinson's disease in her brother, sister, and sister; Stroke in her father.   SOCIAL HISTORY:  The patient  reports that she has never smoked. She has never used smokeless tobacco. She reports that she does not drink alcohol and does not use drugs.  Review of Systems  Constitutional: Positive for malaise/fatigue. Negative for chills and fever.  HENT:  Negative for hoarse voice and nosebleeds.   Eyes:   Negative for discharge, double vision and pain.  Cardiovascular:  Positive for dyspnea on exertion (improving) and leg swelling (improving). Negative for chest pain, claudication, near-syncope, orthopnea, palpitations, paroxysmal nocturnal dyspnea and syncope.  Respiratory:  Negative for hemoptysis and shortness of breath.   Musculoskeletal:  Positive for joint pain. Negative for muscle cramps and myalgias.  Gastrointestinal:  Negative for abdominal pain, constipation, diarrhea, hematemesis, hematochezia, melena, nausea and vomiting.  Neurological:  Negative for dizziness and light-headedness.   PHYSICAL EXAM: Vitals with BMI 12/31/2020 10/01/2020 09/03/2020  Height '5\' 10"'$  '5\' 10"'$  '5\' 10"'$   Weight 213 lbs 229 lbs 13 oz 227 lbs 10 oz  BMI 30.56 123456 [id number]  Systolic [id number] 123456 123456  Diastolic 68 75 68  Pulse 81 88 88    CONSTITUTIONAL: Well-developed and well-nourished. No acute distress.  Ambulates with a cane SKIN: Skin is warm and dry. No rash noted. No cyanosis. No pallor. No jaundice HEAD: Normocephalic and atraumatic.  EYES: No scleral icterus MOUTH/THROAT: Moist oral membranes.  NECK: No JVD present. No thyromegaly noted. No carotid bruits  LYMPHATIC: No visible cervical adenopathy.  CHEST Normal respiratory effort. No intercostal retractions  LUNGS: Clear to auscultation bilaterally.  No stridor. No wheezes. No rales.  CARDIOVASCULAR: Regular rate and rhythm, positive S1-S2, no murmurs rubs or gallops appreciated ABDOMINAL: .Obese, soft, nontender, nondistended, positive bowel sounds all 4 quadrants.No apparent ascites.  EXTREMITIES: Bilateral +1 peripheral edema, warm to touch, tender to touch, skin changes suggestive of chronic venous stasis. HEMATOLOGIC: No significant bruising NEUROLOGIC: Oriented to person, place, and time. Nonfocal. Normal muscle tone.  PSYCHIATRIC: Normal mood and affect. Normal behavior. Cooperative  CARDIAC DATABASE: EKG: 09/03/2020: Normal sinus rhythm, 81  bpm, right bundle branch block, occasional PVCs.    Echocardiogram: 09/2011: LVEF 55-60% mildly dilated left atrium, PASP 33 mmHg.  11/14/2019:  Left ventricle cavity is normal in size. Mild concentric hypertrophy of the left ventricle. Mild global hypokinesis. LVEF 45-50%. Doppler evidence of grade II (pseudonormal) diastolic dysfunction, elevated LAP.  Left atrial cavity is severely dilated at 51 cc/cm2.  Mild (Grade I) mitral regurgitation.  Mild tricuspid regurgitation. Estimated pulmonary artery systolic pressure is 21 mmHg.   Stress Testing:  Lexiscan/modified Bruce Tetrofosmin stress test 11/23/2019:  Lexiscan nuclear stress test performed using 1-day protocol. Stress EKG is non-diagnostic, as this is pharmacological stress test. In addition, rest and stress EKG showed sinus rhythm, anterosetpal nonspecific T wave inversion, frequent PVC's.  Decreased counts in inferior/inferolateral myocardium seen worse at rest, likely due to breast tissue attenuation with images performed in sitting position. Stress LVEF 48%.  Intermediate risk study. Recommend clinical correlation.    Heart Catheterization: None  LABORATORY DATA: CBC Latest Ref Rng & Units 02/02/2017 02/01/2017 01/31/2017  WBC 4.0 - 10.5 K/uL 7.9 13.6(H) 12.4(H)  Hemoglobin 12.0 - 15.0 g/dL 11.8(L) 12.4 12.3  Hematocrit 36.0 - 46.0 % 35.4(L) 37.9 37.5  Platelets 150 - 400 K/uL 123(L) 137(L) 122(L)    CMP Latest Ref Rng & Units 11/07/2020 10/29/2020 10/09/2020  Glucose 65 - 99 mg/dL 107(H) 107(H) 114(H)  BUN 8 - 27 mg/dL '21 22 14  '$ Creatinine 0.57 - 1.00 mg/dL 1.27(H) 1.60(H) 1.04(H)  Sodium 134 - 144 mmol/L 140 142 145(H)  Potassium 3.5 - 5.2 mmol/L 4.9 4.8 5.3(H)  Chloride 96 - 106 mmol/L 103 105 107(H)  CO2 20 - 29 mmol/L '22 22 22  '$ Calcium 8.7 - 10.3 mg/dL 9.7 9.5 9.8  Total Protein 6.5 - 8.1 g/dL - - -  Total Bilirubin 0.3 - 1.2 mg/dL - - -  Alkaline Phos 38 - 126 U/L - - -  AST 15 - 41 U/L - - -  ALT 14 - 54 U/L - - -     Lipid Panel     Component Value Date/Time   CHOL 180 07/12/2018 1251   TRIG 130 07/12/2018 1251   HDL 62 07/12/2018 1251   LDLCALC 92 07/12/2018 1251   LABVLDL 26 07/12/2018 1251    Lab Results  Component Value Date   HGBA1C 5.8 (H) 01/23/2017   No components found for: NTPROBNP No results found for: TSH  Cardiac Panel (last 3 results) No results for input(s): CKTOTAL, CKMB, TROPONINIHS, RELINDX in  the last 72 hours.  IMPRESSION:    ICD-10-CM   1. Chronic combined systolic and diastolic heart failure (HCC)  I50.42 sacubitril-valsartan (ENTRESTO) 49-51 MG    PCV ECHOCARDIOGRAM COMPLETE    Pro b natriuretic peptide (BNP)    Basic metabolic panel    Magnesium    2. Dyspnea on exertion  R06.00 sacubitril-valsartan (ENTRESTO) 49-51 MG    PCV ECHOCARDIOGRAM COMPLETE    3. Right bundle branch block  I45.10     4. Mixed hyperlipidemia  E78.2     5. Essential hypertension  I10     6. Hx pulmonary embolism  Z86.711     7. Hx of deep venous thrombosis  Z86.718     8. Class 1 obesity due to excess calories without serious comorbidity with body mass index (BMI) of 32.0 to 32.9 in adult  E66.09    Z68.32        RECOMMENDATIONS: YVETTE SILCOTT is a 87 y.o. female whose past medical history and cardiovascular risk factors include: Chronic combined systolic and diastolic heart failure, stage B, NYHA class II/III, hypertension, hypothyroidism, prediabetes, diabetic neuropathy, familial Mediterranean fever, history of DVT, history of PE, postmenopausal female, advanced age.   Chronic combined systolic and diastolic heart failure/stage B/NYHA class II: Medications reconciled. Attempted initiation of Farxiga-held secondary to hypotension and acute kidney injury. Due to her losing 16 pounds of weight due to lifestyle changes and her feeling tired, fatigued, lightheaded and dizzy the shared decision was to decrease her Entresto down to 49/51 mg p.o. twice daily.  Objectively her  principal care management data independently reviewed which notes an average blood pressure over the last 2 weeks to be 108/61 mmHg. Repeat echocardiogram given the change in clinical status to evaluate LVEF and regional wall motion abnormalities. Continue other cardiac medications. No recent hospitalization for congestive heart failure.    Dyspnea on exertion: Chronic, improving.  See above  Mixed hyperlipidemia: Most recent lipid profile reviewed.  Continue Livalo.  Patient does not endorse myalgias.  Right bundle branch block: Continue to monitor.  History of pulmonary embolism and deep venous thrombosis after surgery:  Currently on oral anticoagulation, managed by PCP.   Creatinine clearance 46 mL/min. Recommended decreasing Xarelto to '15mg'$  p.o. q. Supper 1 bottle of samples provided patient is asked to follow-up with PCP.  Obesity, due to excess calories: Body mass index is 30.56 kg/m. I reviewed with the patient the importance of diet, regular physical activity/exercise, weight loss.   Patient is educated on increasing physical activity gradually as tolerated.  With the goal of moderate intensity exercise for 30 minutes a day 5 days a week.  Outside labs independently reviewed as a part of this office encounter.  FINAL MEDICATION LIST END OF ENCOUNTER: Meds ordered this encounter  Medications   sacubitril-valsartan (ENTRESTO) 49-51 MG    Sig: Take 1 tablet by mouth 2 (two) times daily.    Dispense:  180 tablet    Refill:  0      Current Outpatient Medications:    acetaminophen (TYLENOL) 650 MG CR tablet, Take 1,300 mg by mouth every 8 (eight) hours as needed for pain., Disp: , Rfl:    colchicine 0.6 MG tablet, Take 0.6 mg by mouth daily., Disp: , Rfl:    gabapentin (NEURONTIN) 300 MG capsule, Take 300 mg by mouth in the morning and at bedtime. , Disp: , Rfl:    levothyroxine (SYNTHROID) 50 MCG tablet, Take 1 tablet by mouth daily., Disp: ,  Rfl:    metoprolol succinate  (TOPROL XL) 25 MG 24 hr tablet, Take 1 tablet (25 mg total) by mouth daily., Disp: 90 tablet, Rfl: 2   Multiple Vitamins-Minerals (CENTRUM SILVER PO), Take 1 tablet by mouth daily., Disp: , Rfl:    Pitavastatin Calcium (LIVALO) 2 MG TABS, Take 1 tablet (2 mg total) by mouth daily., Disp: 90 tablet, Rfl: 3   Rivaroxaban (XARELTO) 15 MG TABS tablet, Take 15 mg by mouth daily with supper., Disp: , Rfl:    sacubitril-valsartan (ENTRESTO) 49-51 MG, Take 1 tablet by mouth 2 (two) times daily., Disp: 180 tablet, Rfl: 0  Orders Placed This Encounter  Procedures   Pro b natriuretic peptide (BNP)   Basic metabolic panel   Magnesium   PCV ECHOCARDIOGRAM COMPLETE    --Continue cardiac medications as reconciled in final medication list. --Return in about 6 months (around 07/03/2021) for Follow up, heart failure management.. Or sooner if needed. --Continue follow-up with your primary care physician regarding the management of your other chronic comorbid conditions.  Patient's questions and concerns were addressed to her satisfaction. She voices understanding of the instructions provided during this encounter.   This note was created using a voice recognition software as a result there may be grammatical errors inadvertently enclosed that do not reflect the nature of this encounter. Every attempt is made to correct such errors.   Rex Kras, [place], [facility]  Pager: [phone] Office: [phone]

## 2021-01-08 ENCOUNTER — Other Ambulatory Visit: Payer: Self-pay

## 2021-01-08 DIAGNOSIS — I5042 Chronic combined systolic (congestive) and diastolic (congestive) heart failure: Secondary | ICD-10-CM

## 2021-01-11 DIAGNOSIS — I5022 Chronic systolic (congestive) heart failure: Secondary | ICD-10-CM | POA: Diagnosis not present

## 2021-01-17 ENCOUNTER — Other Ambulatory Visit: Payer: Self-pay

## 2021-01-17 ENCOUNTER — Ambulatory Visit: Payer: Medicare HMO

## 2021-01-17 DIAGNOSIS — R0609 Other forms of dyspnea: Secondary | ICD-10-CM | POA: Diagnosis not present

## 2021-01-17 DIAGNOSIS — I5042 Chronic combined systolic (congestive) and diastolic (congestive) heart failure: Secondary | ICD-10-CM

## 2021-01-17 DIAGNOSIS — R0600 Dyspnea, unspecified: Secondary | ICD-10-CM

## 2021-01-21 ENCOUNTER — Other Ambulatory Visit: Payer: Self-pay

## 2021-01-21 ENCOUNTER — Telehealth: Payer: Self-pay | Admitting: Cardiology

## 2021-01-21 DIAGNOSIS — I5042 Chronic combined systolic (congestive) and diastolic (congestive) heart failure: Secondary | ICD-10-CM

## 2021-01-21 MED ORDER — METOPROLOL SUCCINATE ER 25 MG PO TB24: 25.0000 mg | ORAL_TABLET | Freq: Every day | ORAL | 2 refills | Status: DC

## 2021-01-21 NOTE — Telephone Encounter (Signed)
Sent!

## 2021-01-21 NOTE — Telephone Encounter (Signed)
Pt is out of metoprolol & wants to know if we can refill it. Uses [facility]. Phone number is [phone].

## 2021-02-06 NOTE — Progress Notes (Signed)
Called and spoke to pt. Pt voiced understanding. Pt said she is doing wonderful with the [facility] and appreciates everything you have done for her.

## 2021-02-11 DIAGNOSIS — I5022 Chronic systolic (congestive) heart failure: Secondary | ICD-10-CM | POA: Diagnosis not present

## 2021-03-13 DIAGNOSIS — I5022 Chronic systolic (congestive) heart failure: Secondary | ICD-10-CM | POA: Diagnosis not present

## 2021-03-29 ENCOUNTER — Other Ambulatory Visit: Payer: Self-pay

## 2021-03-29 ENCOUNTER — Telehealth: Payer: Self-pay | Admitting: Cardiology

## 2021-03-29 DIAGNOSIS — R0609 Other forms of dyspnea: Secondary | ICD-10-CM

## 2021-03-29 DIAGNOSIS — I5042 Chronic combined systolic (congestive) and diastolic (congestive) heart failure: Secondary | ICD-10-CM

## 2021-03-29 MED ORDER — ENTRESTO 49-51 MG PO TABS: 1.0000 | ORAL_TABLET | Freq: Two times a day (BID) | ORAL | 0 refills | Status: DC

## 2021-03-29 NOTE — Telephone Encounter (Signed)
Okay to refill? 

## 2021-03-29 NOTE — Telephone Encounter (Signed)
Done

## 2021-03-29 NOTE — Telephone Encounter (Signed)
Patient requesting 30 day supply for entresto, not 90 day. Says she will be out of her current prescription today.

## 2021-04-02 DIAGNOSIS — H7292 Unspecified perforation of tympanic membrane, left ear: Secondary | ICD-10-CM | POA: Diagnosis not present

## 2021-04-02 DIAGNOSIS — H6092 Unspecified otitis externa, left ear: Secondary | ICD-10-CM | POA: Diagnosis not present

## 2021-04-04 DIAGNOSIS — D485 Neoplasm of uncertain behavior of skin: Secondary | ICD-10-CM | POA: Diagnosis not present

## 2021-04-04 DIAGNOSIS — Z23 Encounter for immunization: Secondary | ICD-10-CM | POA: Diagnosis not present

## 2021-04-04 DIAGNOSIS — I4891 Unspecified atrial fibrillation: Secondary | ICD-10-CM | POA: Diagnosis not present

## 2021-04-04 DIAGNOSIS — L570 Actinic keratosis: Secondary | ICD-10-CM | POA: Diagnosis not present

## 2021-04-04 DIAGNOSIS — E850 Non-neuropathic heredofamilial amyloidosis: Secondary | ICD-10-CM | POA: Diagnosis not present

## 2021-04-04 DIAGNOSIS — E7800 Pure hypercholesterolemia, unspecified: Secondary | ICD-10-CM | POA: Diagnosis not present

## 2021-04-04 DIAGNOSIS — I5042 Chronic combined systolic (congestive) and diastolic (congestive) heart failure: Secondary | ICD-10-CM | POA: Diagnosis not present

## 2021-04-04 DIAGNOSIS — D6869 Other thrombophilia: Secondary | ICD-10-CM | POA: Diagnosis not present

## 2021-04-04 DIAGNOSIS — E042 Nontoxic multinodular goiter: Secondary | ICD-10-CM | POA: Diagnosis not present

## 2021-04-04 DIAGNOSIS — Z86718 Personal history of other venous thrombosis and embolism: Secondary | ICD-10-CM | POA: Diagnosis not present

## 2021-04-04 DIAGNOSIS — L820 Inflamed seborrheic keratosis: Secondary | ICD-10-CM | POA: Diagnosis not present

## 2021-04-04 DIAGNOSIS — G629 Polyneuropathy, unspecified: Secondary | ICD-10-CM | POA: Diagnosis not present

## 2021-04-04 DIAGNOSIS — I10 Essential (primary) hypertension: Secondary | ICD-10-CM | POA: Diagnosis not present

## 2021-04-07 ENCOUNTER — Encounter: Payer: Self-pay | Admitting: Pharmacist

## 2021-04-07 NOTE — Progress Notes (Signed)
CARE PLAN ENTRY  04/07/2021 Name: Debbie Richards MRN: [id number] DOB: [birth year]  Debbie Richards is enrolled in Remote Patient Monitoring/Principle Care Monitoring.  Date of Enrollment: 09/03/20 Supervising physician: Rex Kras Indication: HTN  Remote Readings: Compliant and Avg BP: 111/59, HR:66  Next scheduled OV: 07/04/21  Pharmacist Clinical Goal(s):  Over the next 90 days, patient will demonstrate Improved medication adherence as evidenced by medication fill history Over the next 90 days, patient will demonstrate improved understanding of prescribed medications and rationale for usage as evidenced by patient teach back Over the next 90 days, patient will experience decrease in ED visits. ED visits in last 6 months = 0 Over the next 90 days, patient will not experience hospital admission. Hospital Admissions in last 6 months = 0  Interventions: Provider and Inter-disciplinary care team collaboration (see longitudinal plan of care) Comprehensive medication review performed. Discussed plans with patient for ongoing care management follow up and provided patient with direct contact information for care management team Collaboration with provider re: medication management  Patient Self Care Activities:  Self administers medications as prescribed Attends all scheduled provider appointments Performs ADL's independently Performs IADL's independently  Allergies  Allergen Reactions   Dilaudid [Hydromorphone Hcl] Other (See Comments)    Flushing, hallucinations   Gabapentin Other (See Comments)    RLS with burning   Lipitor [Atorvastatin Calcium] Other (See Comments)    Muscle aches    Lovastatin Other (See Comments)    Muscle pain   Zetia [Ezetimibe] Other (See Comments)    Muscles aches   Zocor [Simvastatin] Other (See Comments)    Muscle Aches   Outpatient Encounter Medications as of 04/07/2021  Medication Sig   acetaminophen (TYLENOL) 650 MG CR tablet Take 1,300  mg by mouth every 8 (eight) hours as needed for pain.   colchicine 0.6 MG tablet Take 0.6 mg by mouth daily.   gabapentin (NEURONTIN) 300 MG capsule Take 300 mg by mouth in the morning and at bedtime.    levothyroxine (SYNTHROID) 50 MCG tablet Take 1 tablet by mouth daily.   metoprolol succinate (TOPROL XL) 25 MG 24 hr tablet Take 1 tablet (25 mg total) by mouth daily.   Multiple Vitamins-Minerals (CENTRUM SILVER PO) Take 1 tablet by mouth daily.   Pitavastatin Calcium (LIVALO) 2 MG TABS Take 1 tablet (2 mg total) by mouth daily.   Rivaroxaban (XARELTO) 15 MG TABS tablet Take 15 mg by mouth daily with supper.   sacubitril-valsartan (ENTRESTO) 49-51 MG Take 1 tablet by mouth 2 (two) times daily.   No facility-administered encounter medications on file as of 04/07/2021.    Hypertension   BP goal is:  <130/80  Office blood pressures are  BP Readings from Last 3 Encounters:  12/31/20 132/68  10/01/20 120/75  09/03/20 113/68    Patient is currently controlled on the following medications: Entresto 49/51 mg BID, Metoprolol 25 mg,  Patient checks BP at home daily  Patient home BP readings are ranging: 77-130/44-81  Patient has tried  these meds in the past: atenolol, lasix   We discussed diet and exercise extensively  Plan  Continue current medications and control with diet and exercise   Reviewed with pt regarding soft BP readings. Soft BP readings more prominent in the morning. PT continues to deny any complains of feeling lightheadedness, dizziness, syncope. Encouraged pt to continue staying well hydrated and holding metoprolol whenever SBP <100 mmHg. Will continue current therapy and continue close monitoring.Recently attempted to get [facility]  completed thorough PAP on pt's behalf, but pt's current annual income above the eligibility requirement.   ______________ Visit Information SDOH (Social Determinants of Health) assessments performed: Yes.  Ms. Pirani was given  information about Principle Care Management/Remote Patient Monitoring services today including:  RPM/PCM service includes personalized support from designated clinical staff supervised by her physician, including individualized plan of care and coordination with other care providers 24/7 contact phone numbers for assistance for urgent and routine care needs. Standard insurance, coinsurance, copays and deductibles apply for principle care management only during months in which we provide at least 30 minutes of these services. Most insurances cover these services at 100%, however patients may be responsible for any copay, coinsurance and/or deductible if applicable. This service may help you avoid the need for more expensive face-to-face services. Only one practitioner may furnish and bill the service in a calendar month. The patient may stop PCM/RPM services at any time (effective at the end of the month) by phone call to the office staff.  Patient agreed to services and verbal consent obtained.   Manuela Schwartz, Pharm.D. [facility] [phone] Ext: 120

## 2021-04-13 DIAGNOSIS — I5022 Chronic systolic (congestive) heart failure: Secondary | ICD-10-CM | POA: Diagnosis not present

## 2021-04-22 DIAGNOSIS — I5042 Chronic combined systolic (congestive) and diastolic (congestive) heart failure: Secondary | ICD-10-CM | POA: Diagnosis not present

## 2021-04-23 LAB — BASIC METABOLIC PANEL
BUN/Creatinine Ratio: 24 (ref 12–28)
BUN: 20 mg/dL (ref 8–27)
CO2: 25 mmol/L (ref 20–29)
Calcium: 9.3 mg/dL (ref 8.7–10.3)
Chloride: 105 mmol/L (ref 96–106)
Creatinine, Ser: 0.85 mg/dL (ref 0.57–1.00)
Glucose: 106 mg/dL — ABNORMAL HIGH (ref 70–99)
Potassium: 4.1 mmol/L (ref 3.5–5.2)
Sodium: 144 mmol/L (ref 134–144)
eGFR: 66 mL/min/{1.73_m2} (ref >59)

## 2021-04-23 LAB — PRO B NATRIURETIC PEPTIDE: NT-Pro BNP: 473 pg/mL (ref 0–738)

## 2021-04-23 LAB — MAGNESIUM: Magnesium: 2.2 mg/dL (ref 1.6–2.3)

## 2021-04-25 NOTE — Progress Notes (Signed)
I spoke to patient's daughter [name] and she is aware of results

## 2021-05-08 DIAGNOSIS — I4891 Unspecified atrial fibrillation: Secondary | ICD-10-CM | POA: Diagnosis not present

## 2021-05-08 DIAGNOSIS — M542 Cervicalgia: Secondary | ICD-10-CM | POA: Diagnosis not present

## 2021-05-08 DIAGNOSIS — I5042 Chronic combined systolic (congestive) and diastolic (congestive) heart failure: Secondary | ICD-10-CM | POA: Diagnosis not present

## 2021-05-08 DIAGNOSIS — E039 Hypothyroidism, unspecified: Secondary | ICD-10-CM | POA: Diagnosis not present

## 2021-05-08 DIAGNOSIS — E7800 Pure hypercholesterolemia, unspecified: Secondary | ICD-10-CM | POA: Diagnosis not present

## 2021-05-08 DIAGNOSIS — I10 Essential (primary) hypertension: Secondary | ICD-10-CM | POA: Diagnosis not present

## 2021-05-08 DIAGNOSIS — K219 Gastro-esophageal reflux disease without esophagitis: Secondary | ICD-10-CM | POA: Diagnosis not present

## 2021-05-09 ENCOUNTER — Other Ambulatory Visit: Payer: Self-pay

## 2021-05-09 DIAGNOSIS — I5042 Chronic combined systolic (congestive) and diastolic (congestive) heart failure: Secondary | ICD-10-CM

## 2021-05-09 DIAGNOSIS — R0609 Other forms of dyspnea: Secondary | ICD-10-CM

## 2021-05-09 MED ORDER — ENTRESTO 49-51 MG PO TABS: 1.0000 | ORAL_TABLET | Freq: Two times a day (BID) | ORAL | 3 refills | Status: DC

## 2021-05-10 ENCOUNTER — Telehealth: Payer: Self-pay

## 2021-05-13 DIAGNOSIS — I5022 Chronic systolic (congestive) heart failure: Secondary | ICD-10-CM | POA: Diagnosis not present

## 2021-05-14 DIAGNOSIS — I4891 Unspecified atrial fibrillation: Secondary | ICD-10-CM | POA: Diagnosis not present

## 2021-05-14 DIAGNOSIS — I10 Essential (primary) hypertension: Secondary | ICD-10-CM | POA: Diagnosis not present

## 2021-05-14 DIAGNOSIS — E039 Hypothyroidism, unspecified: Secondary | ICD-10-CM | POA: Diagnosis not present

## 2021-05-14 DIAGNOSIS — E7800 Pure hypercholesterolemia, unspecified: Secondary | ICD-10-CM | POA: Diagnosis not present

## 2021-05-14 DIAGNOSIS — K219 Gastro-esophageal reflux disease without esophagitis: Secondary | ICD-10-CM | POA: Diagnosis not present

## 2021-05-14 DIAGNOSIS — I5042 Chronic combined systolic (congestive) and diastolic (congestive) heart failure: Secondary | ICD-10-CM | POA: Diagnosis not present

## 2021-05-21 ENCOUNTER — Other Ambulatory Visit: Payer: Self-pay | Admitting: Pharmacist

## 2021-05-21 DIAGNOSIS — R0609 Other forms of dyspnea: Secondary | ICD-10-CM

## 2021-05-21 DIAGNOSIS — I5042 Chronic combined systolic (congestive) and diastolic (congestive) heart failure: Secondary | ICD-10-CM

## 2021-05-21 MED ORDER — ENTRESTO 49-51 MG PO TABS: 1.0000 | ORAL_TABLET | Freq: Two times a day (BID) | ORAL | 3 refills | Status: DC

## 2021-06-06 DIAGNOSIS — M797 Fibromyalgia: Secondary | ICD-10-CM | POA: Diagnosis not present

## 2021-06-06 DIAGNOSIS — G603 Idiopathic progressive neuropathy: Secondary | ICD-10-CM | POA: Diagnosis not present

## 2021-06-06 DIAGNOSIS — G2581 Restless legs syndrome: Secondary | ICD-10-CM | POA: Diagnosis not present

## 2021-06-06 DIAGNOSIS — M542 Cervicalgia: Secondary | ICD-10-CM | POA: Diagnosis not present

## 2021-06-06 DIAGNOSIS — Z79899 Other long term (current) drug therapy: Secondary | ICD-10-CM | POA: Diagnosis not present

## 2021-06-13 DIAGNOSIS — I5022 Chronic systolic (congestive) heart failure: Secondary | ICD-10-CM | POA: Diagnosis not present

## 2021-06-20 DIAGNOSIS — G5603 Carpal tunnel syndrome, bilateral upper limbs: Secondary | ICD-10-CM | POA: Diagnosis not present

## 2021-06-20 DIAGNOSIS — G603 Idiopathic progressive neuropathy: Secondary | ICD-10-CM | POA: Diagnosis not present

## 2021-06-20 DIAGNOSIS — R270 Ataxia, unspecified: Secondary | ICD-10-CM | POA: Diagnosis not present

## 2021-06-20 DIAGNOSIS — G2581 Restless legs syndrome: Secondary | ICD-10-CM | POA: Diagnosis not present

## 2021-06-20 DIAGNOSIS — M5412 Radiculopathy, cervical region: Secondary | ICD-10-CM | POA: Diagnosis not present

## 2021-06-20 DIAGNOSIS — G5623 Lesion of ulnar nerve, bilateral upper limbs: Secondary | ICD-10-CM | POA: Diagnosis not present

## 2021-06-20 DIAGNOSIS — M797 Fibromyalgia: Secondary | ICD-10-CM | POA: Diagnosis not present

## 2021-06-20 DIAGNOSIS — M5417 Radiculopathy, lumbosacral region: Secondary | ICD-10-CM | POA: Diagnosis not present

## 2021-06-28 ENCOUNTER — Other Ambulatory Visit: Payer: Self-pay

## 2021-06-28 DIAGNOSIS — I5042 Chronic combined systolic (congestive) and diastolic (congestive) heart failure: Secondary | ICD-10-CM

## 2021-06-29 LAB — MAGNESIUM: Magnesium: 2.1 mg/dL (ref 1.6–2.3)

## 2021-06-29 LAB — BASIC METABOLIC PANEL
BUN/Creatinine Ratio: 18 (ref 12–28)
BUN: 17 mg/dL (ref 8–27)
CO2: 25 mmol/L (ref 20–29)
Calcium: 8.9 mg/dL (ref 8.7–10.3)
Chloride: 104 mmol/L (ref 96–106)
Creatinine, Ser: 0.93 mg/dL (ref 0.57–1.00)
Glucose: 113 mg/dL — ABNORMAL HIGH (ref 70–99)
Potassium: 4.1 mmol/L (ref 3.5–5.2)
Sodium: 142 mmol/L (ref 134–144)
eGFR: 59 mL/min/{1.73_m2} — ABNORMAL LOW (ref >59)

## 2021-06-29 LAB — PRO B NATRIURETIC PEPTIDE: NT-Pro BNP: 611 pg/mL (ref 0–738)

## 2021-07-04 ENCOUNTER — Encounter: Payer: Self-pay | Admitting: Cardiology

## 2021-07-04 ENCOUNTER — Other Ambulatory Visit: Payer: Self-pay

## 2021-07-04 ENCOUNTER — Ambulatory Visit: Payer: Medicare HMO | Admitting: Cardiology

## 2021-07-04 VITALS — BP 116/63 | HR 78 | Temp 98.0°F | Resp 16 | Ht 70.0 in | Wt 214.0 lb

## 2021-07-04 DIAGNOSIS — Z86718 Personal history of other venous thrombosis and embolism: Secondary | ICD-10-CM

## 2021-07-04 DIAGNOSIS — E6609 Other obesity due to excess calories: Secondary | ICD-10-CM | POA: Diagnosis not present

## 2021-07-04 DIAGNOSIS — Z86711 Personal history of pulmonary embolism: Secondary | ICD-10-CM | POA: Diagnosis not present

## 2021-07-04 DIAGNOSIS — Z6830 Body mass index (BMI) 30.0-30.9, adult: Secondary | ICD-10-CM

## 2021-07-04 DIAGNOSIS — I5042 Chronic combined systolic (congestive) and diastolic (congestive) heart failure: Secondary | ICD-10-CM | POA: Diagnosis not present

## 2021-07-04 DIAGNOSIS — I10 Essential (primary) hypertension: Secondary | ICD-10-CM | POA: Diagnosis not present

## 2021-07-04 DIAGNOSIS — R0609 Other forms of dyspnea: Secondary | ICD-10-CM | POA: Diagnosis not present

## 2021-07-04 DIAGNOSIS — E782 Mixed hyperlipidemia: Secondary | ICD-10-CM

## 2021-07-04 NOTE — Progress Notes (Signed)
Debbie Richards Date of Birth: Jul 10, 1930 MRN: [id number] Primary Care Provider:Meyers, Annie Main, MD Former Cardiology Providers: Sinclair Grooms, MD, Sueanne Margarita, MD, Jeri Lager, APRN, FNP-C Primary Cardiologist: Rex Kras, DO, [facility] (established care 11/10/2019)  Date: 07/04/21 Last Office Visit: 12/31/2020.  Chief Complaint  Patient presents with   heart failure management   Follow-up    HPI  Debbie Richards is a 88 y.o.  female who presents to the office with a chief complaint of " 95-monthfollow-up for heart failure management 395-monthollow-up."  Patient's past medical history and cardiovascular risk factors include: Chronic combined systolic and diastolic heart failure, stage B, NYHA class II, hypertension, hypothyroidism, prediabetes, diabetic neuropathy, familial Mediterranean fever, history of DVT, history of PE, postmenopausal female, advanced age.   Patient is accompanied with her daughter Debbie Mercuryt today's office visit.  Patient presents today for 6-47-monthllow-up visit for heart failure management.  She denies any hospitalizations or urgent care visits for cardiovascular symptoms.  Patient is not experiencing anginal discomfort.  She does have chronic and stable dyspnea on exertion.  Her lower extremity swelling also remains stable over the last 6 months.  Objectively she had an echocardiogram since her last visit which notes relatively stable LVEF.  Grade 2 diastolic dysfunction has now improved to grade 1 and she no longer has elevated left atrial pressures.  Her weight remains relatively stable over the last 6 months.  Most recent labs from 06/28/2021 notes stable renal function, NT proBNP is 611.  Of note, she was on Farxiga in the past but this was discontinued due to lightheaded/dizziness and AKI.  She has been unable to tolerate statin medications including Lipitor, Crestor, Zocor.  She is able to tolerate Livalo.   FUNCTIONAL STATUS: She able to garden and  maintain her home. No structured exercise program or daily routine.    ALLERGIES: Allergies  Allergen Reactions   Dilaudid [Hydromorphone Hcl] Other (See Comments)    Flushing, hallucinations   Gabapentin Other (See Comments)    RLS with burning   Lipitor [Atorvastatin Calcium] Other (See Comments)    Muscle aches    Lovastatin Other (See Comments)    Muscle pain   Zetia [Ezetimibe] Other (See Comments)    Muscles aches   Zocor [Simvastatin] Other (See Comments)    Muscle Aches     MEDICATION LIST PRIOR TO VISIT: Current Outpatient Medications on File Prior to Visit  Medication Sig Dispense Refill   acetaminophen (TYLENOL) 650 MG CR tablet Take 1,300 mg by mouth every 8 (eight) hours as needed for pain.     colchicine 0.6 MG tablet Take 0.6 mg by mouth daily.     gabapentin (NEURONTIN) 300 MG capsule Take 300 mg by mouth in the morning and at bedtime.      levothyroxine (SYNTHROID) 50 MCG tablet Take 1 tablet by mouth daily.     metoprolol succinate (TOPROL XL) 25 MG 24 hr tablet Take 1 tablet (25 mg total) by mouth daily. 90 tablet 2   Multiple Vitamins-Minerals (CENTRUM SILVER PO) Take 1 tablet by mouth daily.     Pitavastatin Calcium (LIVALO) 2 MG TABS Take 1 tablet (2 mg total) by mouth daily. 90 tablet 3   Rivaroxaban (XARELTO) 15 MG TABS tablet Take 15 mg by mouth daily with supper.     sacubitril-valsartan (ENTRESTO) 49-51 MG Take 1 tablet by mouth 2 (two) times daily. 180 tablet 3   No current facility-administered medications on file  prior to visit.    PAST MEDICAL HISTORY: Past Medical History:  Diagnosis Date   Borderline diabetes    Chronic back pain    Chronic lower back pain    Clotting disorder ([place])    Deep vein thrombosis (DVT) ([place])    DVT, lower extremity ([place]) 01/2002; 06/2003   RLE; LLE   Enlarged thyroid gland    Familial Mediterranean fever (HCC)    Fibromyalgia    Fibromyalgia    Gallstones    GERD (gastroesophageal reflux disease)    GERD  (gastroesophageal reflux disease)    H/O hiatal hernia    History of stomach ulcers    Hyperlipidemia    Hypertension    Hypothyroidism    Insomnia    Migraine    "I've had 4; last time was when I was in my ?30's"   Osteoarthritis    Phlebitis    LLE   Pneumonia    Pulmonary embolism ([place]) 2003   Shortness of breath on exertion    Tinnitus     PAST SURGICAL HISTORY: Past Surgical History:  Procedure Laterality Date   ABDOMINAL HYSTERECTOMY  1972   APPENDECTOMY  1960   bladder tack     BREAST BIOPSY     left   CARPAL TUNNEL RELEASE  1989   right   CATARACT EXTRACTION, BILATERAL  ~ 2011   CHOLECYSTECTOMY  09/12/2011   Procedure: LAPAROSCOPIC CHOLECYSTECTOMY WITH INTRAOPERATIVE CHOLANGIOGRAM;  Surgeon: Shann Medal, MD;  Location: [facility];  Service: General;  Laterality: N/A;   excision of goiter     EXTERNAL EAR SURGERY     x3   HERNIA REPAIR  1970's   ventral   KNEE ARTHROSCOPY Left 12/05/2019   Procedure: ARTHROSCOPY KNEE AND DEBRIDEMENT, partial medial and lateral meniscectomy, left femoral patella chondroplasty;  Surgeon: Dorna Leitz, MD;  Location: [facility];  Service: Orthopedics;  Laterality: Left;   MASS EXCISION  12/2001   paravertebral chest mass   MIDDLE EAR SURGERY  1959; 1982; after 1982   mini thoracotomy  12/2001   THORACOTOMY  2003   procedure done   to remove growth in lung    TOTAL KNEE ARTHROPLASTY Right 01/30/2017   Procedure: RIGHT TOTAL KNEE ARTHROPLASTY;  Surgeon: Dorna Leitz, MD;  Location: [facility];  Service: Orthopedics;  Laterality: Right;  With adductor canal block    FAMILY HISTORY: The patient's family history includes Dementia in her sister; Heart disease in her mother; Hyperlipidemia in her mother; Hypertension in her mother; Parkinson's disease in her brother, sister, and sister; Stroke in her father.   SOCIAL HISTORY:  The patient  reports that she has never smoked. She has never used smokeless tobacco. She reports that she  does not drink alcohol and does not use drugs.  Review of Systems  Constitutional: Negative for chills, fever and malaise/fatigue.  HENT:  Negative for hoarse voice and nosebleeds.   Eyes:  Negative for discharge, double vision and pain.  Cardiovascular:  Positive for dyspnea on exertion (improving) and leg swelling (improving). Negative for chest pain, claudication, near-syncope, orthopnea, palpitations, paroxysmal nocturnal dyspnea and syncope.  Respiratory:  Negative for hemoptysis and shortness of breath.   Musculoskeletal:  Positive for joint pain and joint swelling. Negative for muscle cramps and myalgias.  Gastrointestinal:  Negative for abdominal pain, constipation, diarrhea, hematemesis, hematochezia, melena, nausea and vomiting.  Neurological:  Negative for dizziness and light-headedness.   PHYSICAL EXAM: Vitals with BMI 07/04/2021 12/31/2020 10/01/2020  Height 5' 10"  5' 10"  5' 10"   Weight 214 lbs 213 lbs 229 lbs 13 oz  BMI 30.71 24.40 10.27  Systolic 253 664 403  Diastolic 63 68 75  Pulse 78 81 88    CONSTITUTIONAL: Well-developed and well-nourished. No acute distress.  Ambulates with a cane SKIN: Skin is warm and dry. No rash noted. No cyanosis. No pallor. No jaundice HEAD: Normocephalic and atraumatic.  EYES: No scleral icterus MOUTH/THROAT: Moist oral membranes.  NECK: No JVD present. No thyromegaly noted. No carotid bruits  LYMPHATIC: No visible cervical adenopathy.  CHEST Normal respiratory effort. No intercostal retractions  LUNGS: Clear to auscultation bilaterally.  No stridor. No wheezes. No rales.  CARDIOVASCULAR: Regular rate and rhythm, positive S1-S2, no murmurs rubs or gallops appreciated ABDOMINAL: .Obese, soft, nontender, nondistended, positive bowel sounds all 4 quadrants.No apparent ascites.  EXTREMITIES: Bilateral +1 peripheral edema, warm to touch, tender to touch, skin changes suggestive of chronic venous stasis. HEMATOLOGIC: No significant  bruising NEUROLOGIC: Oriented to person, place, and time. Nonfocal. Normal muscle tone.  PSYCHIATRIC: Normal mood and affect. Normal behavior. Cooperative  CARDIAC DATABASE: EKG: 09/03/2020: Normal sinus rhythm, 81 bpm, right bundle branch block, occasional PVCs.   07/04/2021: NSR, 77 bpm, right bundle branch block, PVC.    Echocardiogram: 09/2011: LVEF 55-60% mildly dilated left atrium, PASP 33 mmHg.  11/14/2019: LVEF 47-42%, grade 2 diastolic dysfunction, elevated LAP, severe LAE, mild MR, mild TR, RVSP 21 mmHg.  01/07/2021: Left ventricle cavity is normal in size. Moderate concentric hypertrophy of the left ventricle. Mild global hypokinesis. LVEF 45-50%. Doppler evidence of grade I (impaired) diastolic dysfunction, normal LAP.  Mild tricuspid regurgitation.  Trace pericardial effusion. No evidence of pulmonary hypertension. Compared to previous study, trace pericardial effusion is new.  Stress Testing:  Lexiscan/modified Bruce Tetrofosmin stress test 11/23/2019:  Lexiscan nuclear stress test performed using 1-day protocol. Stress EKG is non-diagnostic, as this is pharmacological stress test. In addition, rest and stress EKG showed sinus rhythm, anterosetpal nonspecific T wave inversion, frequent PVC's.  Decreased counts in inferior/inferolateral myocardium seen worse at rest, likely due to breast tissue attenuation with images performed in sitting position. Stress LVEF 48%.  Intermediate risk study. Recommend clinical correlation.    Heart Catheterization: None  LABORATORY DATA: CBC Latest Ref Rng & Units 02/02/2017 02/01/2017 01/31/2017  WBC 4.0 - 10.5 K/uL 7.9 13.6(H) 12.4(H)  Hemoglobin 12.0 - 15.0 g/dL 11.8(L) 12.4 12.3  Hematocrit 36.0 - 46.0 % 35.4(L) 37.9 37.5  Platelets 150 - 400 K/uL 123(L) 137(L) 122(L)    CMP Latest Ref Rng & Units 06/28/2021 04/22/2021 11/07/2020  Glucose 70 - 99 mg/dL 113(H) 106(H) 107(H)  BUN 8 - 27 mg/dL 17 20 21   Creatinine 0.57 - 1.00 mg/dL 0.93 0.85  1.27(H)  Sodium 134 - 144 mmol/L 142 144 140  Potassium 3.5 - 5.2 mmol/L 4.1 4.1 4.9  Chloride 96 - 106 mmol/L 104 105 103  CO2 20 - 29 mmol/L 25 25 22   Calcium 8.7 - 10.3 mg/dL 8.9 9.3 9.7  Total Protein 6.5 - 8.1 g/dL - - -  Total Bilirubin 0.3 - 1.2 mg/dL - - -  Alkaline Phos 38 - 126 U/L - - -  AST 15 - 41 U/L - - -  ALT 14 - 54 U/L - - -    Lipid Panel     Component Value Date/Time   CHOL 180 07/12/2018 1251   TRIG 130 07/12/2018 1251   HDL 62 07/12/2018 1251   LDLCALC 92  07/12/2018 1251   LABVLDL 26 07/12/2018 1251    Lab Results  Component Value Date   HGBA1C 5.8 (H) 01/23/2017   No components found for: NTPROBNP No results found for: TSH  Cardiac Panel (last 3 results) No results for input(s): CKTOTAL, CKMB, TROPONINIHS, RELINDX in the last 72 hours.  IMPRESSION:    ICD-10-CM   1. Chronic combined systolic and diastolic heart failure (HCC)  I50.42 EKG 12-Lead    Pro b natriuretic peptide (BNP)    CMP14+EGFR    2. Dyspnea on exertion  R06.09 Hemoglobin and hematocrit, blood    3. Mixed hyperlipidemia  E78.2 CMP14+EGFR    Lipid Panel With LDL/HDL Ratio    LDL cholesterol, direct    4. Essential hypertension  I10     5. Hx pulmonary embolism  Z86.711     6. Hx of deep venous thrombosis  Z86.718     7. Class 1 obesity due to excess calories without serious comorbidity with body mass index (BMI) of 30.0 to 30.9 in adult  E66.09    Z68.30        RECOMMENDATIONS: Debbie Richards is a 88 y.o. female whose past medical history and cardiovascular risk factors include: Chronic combined systolic and diastolic heart failure, stage B, NYHA class II/III, hypertension, hypothyroidism, prediabetes, diabetic neuropathy, familial Mediterranean fever, history of DVT, history of PE, postmenopausal female, advanced age.   Chronic combined systolic and diastolic heart failure (HCC) Stage B, NYHA class II/III Since last office visit patient remains stable from a  cardiovascular standpoint.   Repeat echocardiogram notes similar LVEF with improvement in diastolic dysfunction and filling pressures. Principal care management data reviewed.  Patient's blood pressures, daily weights, and pulse relatively stable. Continue current medical therapy We will check a CMP, NT proBNP prior to the next office visit  Mixed hyperlipidemia She has been unable to tolerate statin medications including Lipitor, Crestor, Zocor.   She is able to tolerate Livalo.  We will check a fasting lipid profile and direct LDL prior to next office visit.  Essential hypertension Office blood pressures are very well controlled. Medications reconciled. Principal care management results reviewed with the patient and her daughter. Will continue to monitor  Hx pulmonary embolism / Hx of deep venous thrombosis Currently on oral anticoagulation managed by PCP.  Class 1 obesity due to excess calories without serious comorbidity with body mass index (BMI) of 30.0 to 30.9 in adult Body mass index is 30.71 kg/m. I reviewed with the patient the importance of diet, regular physical activity/exercise, weight loss.   Patient is educated on increasing physical activity gradually as tolerated.  With the goal of moderate intensity exercise for 30 minutes a day 5 days a week.  FINAL MEDICATION LIST END OF ENCOUNTER: No orders of the defined types were placed in this encounter.   Current Outpatient Medications:    acetaminophen (TYLENOL) 650 MG CR tablet, Take 1,300 mg by mouth every 8 (eight) hours as needed for pain., Disp: , Rfl:    colchicine 0.6 MG tablet, Take 0.6 mg by mouth daily., Disp: , Rfl:    gabapentin (NEURONTIN) 300 MG capsule, Take 300 mg by mouth in the morning and at bedtime. , Disp: , Rfl:    levothyroxine (SYNTHROID) 50 MCG tablet, Take 1 tablet by mouth daily., Disp: , Rfl:    metoprolol succinate (TOPROL XL) 25 MG 24 hr tablet, Take 1 tablet (25 mg total) by mouth daily.,  Disp: 90 tablet, Rfl: 2  Multiple Vitamins-Minerals (CENTRUM SILVER PO), Take 1 tablet by mouth daily., Disp: , Rfl:    Pitavastatin Calcium (LIVALO) 2 MG TABS, Take 1 tablet (2 mg total) by mouth daily., Disp: 90 tablet, Rfl: 3   Rivaroxaban (XARELTO) 15 MG TABS tablet, Take 15 mg by mouth daily with supper., Disp: , Rfl:    sacubitril-valsartan (ENTRESTO) 49-51 MG, Take 1 tablet by mouth 2 (two) times daily., Disp: 180 tablet, Rfl: 3  Orders Placed This Encounter  Procedures   Pro b natriuretic peptide (BNP)   CMP14+EGFR   Hemoglobin and hematocrit, blood   Lipid Panel With LDL/HDL Ratio   LDL cholesterol, direct   EKG 12-Lead    --Continue cardiac medications as reconciled in final medication list. --Return in about 6 months (around 01/01/2022) for Follow up, heart failure management.. Or sooner if needed. --Continue follow-up with your primary care physician regarding the management of your other chronic comorbid conditions.  Patient's questions and concerns were addressed to her satisfaction. She voices understanding of the instructions provided during this encounter.   This note was created using a voice recognition software as a result there may be grammatical errors inadvertently enclosed that do not reflect the nature of this encounter. Every attempt is made to correct such errors.  Rex Kras, [place], [facility]  Pager: [phone] Office: [phone]

## 2021-07-09 DIAGNOSIS — K219 Gastro-esophageal reflux disease without esophagitis: Secondary | ICD-10-CM | POA: Diagnosis not present

## 2021-07-09 DIAGNOSIS — E039 Hypothyroidism, unspecified: Secondary | ICD-10-CM | POA: Diagnosis not present

## 2021-07-09 DIAGNOSIS — I4891 Unspecified atrial fibrillation: Secondary | ICD-10-CM | POA: Diagnosis not present

## 2021-07-09 DIAGNOSIS — E7800 Pure hypercholesterolemia, unspecified: Secondary | ICD-10-CM | POA: Diagnosis not present

## 2021-07-09 DIAGNOSIS — I10 Essential (primary) hypertension: Secondary | ICD-10-CM | POA: Diagnosis not present

## 2021-07-09 DIAGNOSIS — I5042 Chronic combined systolic (congestive) and diastolic (congestive) heart failure: Secondary | ICD-10-CM | POA: Diagnosis not present

## 2021-07-10 DIAGNOSIS — W01198A Fall on same level from slipping, tripping and stumbling with subsequent striking against other object, initial encounter: Secondary | ICD-10-CM | POA: Diagnosis not present

## 2021-07-10 DIAGNOSIS — T1490XA Injury, unspecified, initial encounter: Secondary | ICD-10-CM | POA: Diagnosis not present

## 2021-07-10 DIAGNOSIS — R296 Repeated falls: Secondary | ICD-10-CM | POA: Diagnosis not present

## 2021-07-10 DIAGNOSIS — W19XXXA Unspecified fall, initial encounter: Secondary | ICD-10-CM | POA: Diagnosis not present

## 2021-07-10 DIAGNOSIS — Z86718 Personal history of other venous thrombosis and embolism: Secondary | ICD-10-CM | POA: Diagnosis not present

## 2021-07-10 DIAGNOSIS — S0990XA Unspecified injury of head, initial encounter: Secondary | ICD-10-CM | POA: Diagnosis not present

## 2021-07-10 DIAGNOSIS — Z79899 Other long term (current) drug therapy: Secondary | ICD-10-CM | POA: Diagnosis not present

## 2021-07-10 DIAGNOSIS — S0003XA Contusion of scalp, initial encounter: Secondary | ICD-10-CM | POA: Diagnosis not present

## 2021-07-10 DIAGNOSIS — Z7901 Long term (current) use of anticoagulants: Secondary | ICD-10-CM | POA: Diagnosis not present

## 2021-07-10 DIAGNOSIS — I10 Essential (primary) hypertension: Secondary | ICD-10-CM | POA: Diagnosis not present

## 2021-07-14 DIAGNOSIS — I5022 Chronic systolic (congestive) heart failure: Secondary | ICD-10-CM | POA: Diagnosis not present

## 2021-07-17 DIAGNOSIS — J3489 Other specified disorders of nose and nasal sinuses: Secondary | ICD-10-CM | POA: Diagnosis not present

## 2021-07-17 DIAGNOSIS — R059 Cough, unspecified: Secondary | ICD-10-CM | POA: Diagnosis not present

## 2021-07-17 DIAGNOSIS — R5383 Other fatigue: Secondary | ICD-10-CM | POA: Diagnosis not present

## 2021-07-17 DIAGNOSIS — Z20822 Contact with and (suspected) exposure to covid-19: Secondary | ICD-10-CM | POA: Diagnosis not present

## 2021-07-17 DIAGNOSIS — R0981 Nasal congestion: Secondary | ICD-10-CM | POA: Diagnosis not present

## 2021-07-17 DIAGNOSIS — J019 Acute sinusitis, unspecified: Secondary | ICD-10-CM | POA: Diagnosis not present

## 2021-07-17 DIAGNOSIS — J069 Acute upper respiratory infection, unspecified: Secondary | ICD-10-CM | POA: Diagnosis not present

## 2021-07-18 ENCOUNTER — Other Ambulatory Visit: Payer: Self-pay

## 2021-07-18 DIAGNOSIS — I5042 Chronic combined systolic (congestive) and diastolic (congestive) heart failure: Secondary | ICD-10-CM

## 2021-07-18 DIAGNOSIS — R0609 Other forms of dyspnea: Secondary | ICD-10-CM

## 2021-07-18 DIAGNOSIS — E782 Mixed hyperlipidemia: Secondary | ICD-10-CM

## 2021-08-01 ENCOUNTER — Telehealth: Payer: Self-pay

## 2021-08-01 NOTE — Telephone Encounter (Signed)
Home BP readings from this morning reviewed with pt. BP initially 81/52 HR: 100. Pt reports that she felt like her heart was racing more that usual this morning. Reports that she took her entresto and metoprolol this morning ~1-2 hrs prior to the episodes. Pt denies any complains of lightheadedness, dizziness, worsening SOB complains. Recommended pt increase her fluid intake and hold her entresto and metoprolol for today. Checked with pt later in the day and pt reports that her BP readings improved since to 102/65 HR: 90. Pt reports that she was feeling better and reports that her palpitation concerns improved since. Pt reports that she will continue monitoring her symptoms and her vitals and will notify the office if there any any further recurrence or worsening in her symptoms

## 2021-08-13 DIAGNOSIS — I5022 Chronic systolic (congestive) heart failure: Secondary | ICD-10-CM | POA: Diagnosis not present

## 2021-08-30 DIAGNOSIS — I10 Essential (primary) hypertension: Secondary | ICD-10-CM | POA: Diagnosis not present

## 2021-08-30 DIAGNOSIS — E7800 Pure hypercholesterolemia, unspecified: Secondary | ICD-10-CM | POA: Diagnosis not present

## 2021-08-30 DIAGNOSIS — I5042 Chronic combined systolic (congestive) and diastolic (congestive) heart failure: Secondary | ICD-10-CM | POA: Diagnosis not present

## 2021-09-03 ENCOUNTER — Other Ambulatory Visit: Payer: Self-pay

## 2021-09-03 DIAGNOSIS — K5792 Diverticulitis of intestine, part unspecified, without perforation or abscess without bleeding: Secondary | ICD-10-CM

## 2021-09-03 MED ORDER — LIVALO 2 MG PO TABS: 1.0000 | ORAL_TABLET | Freq: Every day | ORAL | 3 refills | Status: DC

## 2021-09-10 NOTE — Telephone Encounter (Signed)
Called to confirmed some medications. ?

## 2021-09-13 DIAGNOSIS — I5022 Chronic systolic (congestive) heart failure: Secondary | ICD-10-CM | POA: Diagnosis not present

## 2021-09-16 ENCOUNTER — Other Ambulatory Visit: Payer: Self-pay

## 2021-09-16 DIAGNOSIS — K5792 Diverticulitis of intestine, part unspecified, without perforation or abscess without bleeding: Secondary | ICD-10-CM

## 2021-09-16 MED ORDER — LIVALO 2 MG PO TABS: 1.0000 | ORAL_TABLET | Freq: Every day | ORAL | 3 refills | Status: AC

## 2021-10-03 DIAGNOSIS — G629 Polyneuropathy, unspecified: Secondary | ICD-10-CM | POA: Diagnosis not present

## 2021-10-03 DIAGNOSIS — I10 Essential (primary) hypertension: Secondary | ICD-10-CM | POA: Diagnosis not present

## 2021-10-03 DIAGNOSIS — Z79899 Other long term (current) drug therapy: Secondary | ICD-10-CM | POA: Diagnosis not present

## 2021-10-03 DIAGNOSIS — E7800 Pure hypercholesterolemia, unspecified: Secondary | ICD-10-CM | POA: Diagnosis not present

## 2021-10-03 DIAGNOSIS — E042 Nontoxic multinodular goiter: Secondary | ICD-10-CM | POA: Diagnosis not present

## 2021-10-03 DIAGNOSIS — I4891 Unspecified atrial fibrillation: Secondary | ICD-10-CM | POA: Diagnosis not present

## 2021-10-03 DIAGNOSIS — E850 Non-neuropathic heredofamilial amyloidosis: Secondary | ICD-10-CM | POA: Diagnosis not present

## 2021-10-03 DIAGNOSIS — I5042 Chronic combined systolic (congestive) and diastolic (congestive) heart failure: Secondary | ICD-10-CM | POA: Diagnosis not present

## 2021-10-03 DIAGNOSIS — H9202 Otalgia, left ear: Secondary | ICD-10-CM | POA: Diagnosis not present

## 2021-10-03 DIAGNOSIS — D6869 Other thrombophilia: Secondary | ICD-10-CM | POA: Diagnosis not present

## 2021-10-03 DIAGNOSIS — R21 Rash and other nonspecific skin eruption: Secondary | ICD-10-CM | POA: Diagnosis not present

## 2021-10-07 DIAGNOSIS — H938X2 Other specified disorders of left ear: Secondary | ICD-10-CM | POA: Diagnosis not present

## 2021-10-07 DIAGNOSIS — H7292 Unspecified perforation of tympanic membrane, left ear: Secondary | ICD-10-CM | POA: Diagnosis not present

## 2021-10-08 DIAGNOSIS — Z872 Personal history of diseases of the skin and subcutaneous tissue: Secondary | ICD-10-CM | POA: Diagnosis not present

## 2021-10-08 DIAGNOSIS — I872 Venous insufficiency (chronic) (peripheral): Secondary | ICD-10-CM | POA: Diagnosis not present

## 2021-10-08 DIAGNOSIS — L570 Actinic keratosis: Secondary | ICD-10-CM | POA: Diagnosis not present

## 2021-10-13 DIAGNOSIS — I5022 Chronic systolic (congestive) heart failure: Secondary | ICD-10-CM | POA: Diagnosis not present

## 2021-10-25 ENCOUNTER — Other Ambulatory Visit: Payer: Self-pay

## 2021-10-25 DIAGNOSIS — I10 Essential (primary) hypertension: Secondary | ICD-10-CM | POA: Diagnosis not present

## 2021-10-25 DIAGNOSIS — K219 Gastro-esophageal reflux disease without esophagitis: Secondary | ICD-10-CM | POA: Diagnosis not present

## 2021-10-25 DIAGNOSIS — E7800 Pure hypercholesterolemia, unspecified: Secondary | ICD-10-CM | POA: Diagnosis not present

## 2021-10-25 DIAGNOSIS — E039 Hypothyroidism, unspecified: Secondary | ICD-10-CM | POA: Diagnosis not present

## 2021-10-25 DIAGNOSIS — I4891 Unspecified atrial fibrillation: Secondary | ICD-10-CM | POA: Diagnosis not present

## 2021-10-25 DIAGNOSIS — I5042 Chronic combined systolic (congestive) and diastolic (congestive) heart failure: Secondary | ICD-10-CM

## 2021-10-25 MED ORDER — METOPROLOL SUCCINATE ER 25 MG PO TB24: 25.0000 mg | ORAL_TABLET | Freq: Every day | ORAL | 2 refills | Status: DC

## 2021-11-06 ENCOUNTER — Emergency Department (HOSPITAL_COMMUNITY): Payer: Medicare HMO

## 2021-11-06 ENCOUNTER — Emergency Department (HOSPITAL_COMMUNITY)
Admission: EM | Admit: 2021-11-06 | Discharge: 2021-11-06 | Disposition: A | Discharge status: Home / Self Care | Payer: Medicare HMO | Attending: Emergency Medicine | Admitting: Emergency Medicine

## 2021-11-06 ENCOUNTER — Telehealth: Payer: Self-pay

## 2021-11-06 ENCOUNTER — Encounter (HOSPITAL_COMMUNITY): Payer: Self-pay | Admitting: Emergency Medicine

## 2021-11-06 DIAGNOSIS — R0602 Shortness of breath: Secondary | ICD-10-CM | POA: Diagnosis not present

## 2021-11-06 DIAGNOSIS — R079 Chest pain, unspecified: Secondary | ICD-10-CM | POA: Diagnosis not present

## 2021-11-06 DIAGNOSIS — K5730 Diverticulosis of large intestine without perforation or abscess without bleeding: Secondary | ICD-10-CM | POA: Diagnosis not present

## 2021-11-06 DIAGNOSIS — R112 Nausea with vomiting, unspecified: Secondary | ICD-10-CM | POA: Diagnosis not present

## 2021-11-06 DIAGNOSIS — K862 Cyst of pancreas: Secondary | ICD-10-CM | POA: Diagnosis not present

## 2021-11-06 DIAGNOSIS — Z7901 Long term (current) use of anticoagulants: Secondary | ICD-10-CM | POA: Insufficient documentation

## 2021-11-06 DIAGNOSIS — E039 Hypothyroidism, unspecified: Secondary | ICD-10-CM | POA: Diagnosis not present

## 2021-11-06 DIAGNOSIS — R0789 Other chest pain: Secondary | ICD-10-CM | POA: Diagnosis not present

## 2021-11-06 DIAGNOSIS — Z743 Need for continuous supervision: Secondary | ICD-10-CM | POA: Diagnosis not present

## 2021-11-06 DIAGNOSIS — N281 Cyst of kidney, acquired: Secondary | ICD-10-CM | POA: Diagnosis not present

## 2021-11-06 DIAGNOSIS — R42 Dizziness and giddiness: Secondary | ICD-10-CM | POA: Diagnosis not present

## 2021-11-06 DIAGNOSIS — I10 Essential (primary) hypertension: Secondary | ICD-10-CM | POA: Insufficient documentation

## 2021-11-06 DIAGNOSIS — Z79899 Other long term (current) drug therapy: Secondary | ICD-10-CM | POA: Insufficient documentation

## 2021-11-06 DIAGNOSIS — K529 Noninfective gastroenteritis and colitis, unspecified: Secondary | ICD-10-CM | POA: Insufficient documentation

## 2021-11-06 DIAGNOSIS — Z20822 Contact with and (suspected) exposure to covid-19: Secondary | ICD-10-CM | POA: Insufficient documentation

## 2021-11-06 DIAGNOSIS — J9811 Atelectasis: Secondary | ICD-10-CM | POA: Diagnosis not present

## 2021-11-06 DIAGNOSIS — R000 Tachycardia, unspecified: Secondary | ICD-10-CM | POA: Diagnosis not present

## 2021-11-06 LAB — CBC
HCT: 41.2 % (ref 36.0–46.0)
Hemoglobin: 13.1 g/dL (ref 12.0–15.0)
MCH: 32.1 pg (ref 26.0–34.0)
MCHC: 31.8 g/dL (ref 30.0–36.0)
MCV: 101 fL — ABNORMAL HIGH (ref 80.0–100.0)
Platelets: 105 10*3/uL — ABNORMAL LOW (ref 150–400)
RBC: 4.08 MIL/uL (ref 3.87–5.11)
RDW: 12.1 % (ref 11.5–15.5)
WBC: 5.7 10*3/uL (ref 4.0–10.5)
nRBC: 0 % (ref 0.0–0.2)

## 2021-11-06 LAB — URINALYSIS, ROUTINE W REFLEX MICROSCOPIC
Bilirubin Urine: NEGATIVE
Glucose, UA: NEGATIVE mg/dL
Hgb urine dipstick: NEGATIVE
Ketones, ur: 5 mg/dL — AB
Leukocytes,Ua: NEGATIVE
Nitrite: NEGATIVE
Protein, ur: NEGATIVE mg/dL
Specific Gravity, Urine: 1.019 (ref 1.005–1.030)
pH: 6 (ref 5.0–8.0)

## 2021-11-06 LAB — COMPREHENSIVE METABOLIC PANEL
ALT: 11 U/L (ref 0–44)
AST: 20 U/L (ref 15–41)
Albumin: 3.6 g/dL (ref 3.5–5.0)
Alkaline Phosphatase: 51 U/L (ref 38–126)
Anion gap: 8 (ref 5–15)
BUN: 16 mg/dL (ref 8–23)
CO2: 21 mmol/L — ABNORMAL LOW (ref 22–32)
Calcium: 8.6 mg/dL — ABNORMAL LOW (ref 8.9–10.3)
Chloride: 112 mmol/L — ABNORMAL HIGH (ref 98–111)
Creatinine, Ser: 0.65 mg/dL (ref 0.44–1.00)
GFR, Estimated: >60 mL/min (ref >60)
Glucose, Bld: 164 mg/dL — ABNORMAL HIGH (ref 70–99)
Potassium: 4 mmol/L (ref 3.5–5.1)
Sodium: 141 mmol/L (ref 135–145)
Total Bilirubin: 0.5 mg/dL (ref 0.3–1.2)
Total Protein: 5.6 g/dL — ABNORMAL LOW (ref 6.5–8.1)

## 2021-11-06 LAB — RESP PANEL BY RT-PCR (FLU A&B, COVID) ARPGX2
Influenza A by PCR: NEGATIVE
Influenza B by PCR: NEGATIVE
SARS Coronavirus 2 by RT PCR: NEGATIVE

## 2021-11-06 LAB — LIPASE, BLOOD: Lipase: 36 U/L (ref 11–51)

## 2021-11-06 LAB — BRAIN NATRIURETIC PEPTIDE: B Natriuretic Peptide: 145.3 pg/mL — ABNORMAL HIGH (ref 0.0–100.0)

## 2021-11-06 LAB — TROPONIN I (HIGH SENSITIVITY)
Troponin I (High Sensitivity): 7 ng/L (ref <18)
Troponin I (High Sensitivity): 11 ng/L (ref <18)

## 2021-11-06 LAB — MAGNESIUM: Magnesium: 1.8 mg/dL (ref 1.7–2.4)

## 2021-11-06 MED ORDER — LACTATED RINGERS IV BOLUS
1000.0000 mL | Freq: Once | INTRAVENOUS | Status: AC
  Administered 2021-11-06: 1000 mL via INTRAVENOUS

## 2021-11-06 MED ORDER — PROCHLORPERAZINE EDISYLATE 10 MG/2ML IJ SOLN
10.0000 mg | Freq: Once | INTRAMUSCULAR | Status: AC
  Administered 2021-11-06: 10 mg via INTRAVENOUS
  Filled 2021-11-06: qty 2

## 2021-11-06 MED ORDER — IOHEXOL 300 MG/ML  SOLN
100.0000 mL | Freq: Once | INTRAMUSCULAR | Status: AC | PRN
  Administered 2021-11-06: 100 mL via INTRAVENOUS

## 2021-11-06 MED ORDER — FENTANYL CITRATE PF 50 MCG/ML IJ SOSY
50.0000 ug | PREFILLED_SYRINGE | Freq: Once | INTRAMUSCULAR | Status: AC
  Administered 2021-11-06: 50 ug via INTRAVENOUS
  Filled 2021-11-06: qty 1

## 2021-11-06 MED ORDER — ONDANSETRON 4 MG PO TBDP: 4.0000 mg | ORAL_TABLET | Freq: Three times a day (TID) | ORAL | 0 refills | Status: DC | PRN

## 2021-11-06 NOTE — ED Triage Notes (Signed)
Pt arrives via EMS from home with n/v/d and dizziness. EMS gave 4 mg zofran and 450 cc NS en route. Pt still nauseous and vomiting.

## 2021-11-06 NOTE — Telephone Encounter (Signed)
Patient called and stated that she feels like she is about to pass out and her BP is 159/74 and it " feels like her heart is about to jump out of her chest" I advised her to call 911 immediately, she said she was calling now.

## 2021-11-06 NOTE — ED Provider Notes (Signed)
[facility] EMERGENCY DEPARTMENT Provider Note   CSN: [id number] Arrival date & time: 11/06/21  1546     History  Chief Complaint  Patient presents with   Emesis    Debbie Richards is a 88 y.o. female.   Emesis  88 year old female with medical history significant for hypertension, familial Mediterranean fever, DVT and PE on Xarelto, fibromyalgia, hypothyroidism, GERD, migraine headaches, HLD, who presents to the emergency department with a chief complaint of 1 day of nausea, vomiting, diarrhea and lightheadedness.  The patient states that she is having some shortness of breath and chest discomfort as well.  She has had decreased oral intake over the past 24 hours.  She has had multiple episodes of NBNB emesis with multiple episodes of watery diarrhea.  She denies any fevers or chills.  Home Medications Prior to Admission medications   Medication Sig Start Date End Date Taking? Authorizing Provider  ondansetron (ZOFRAN-ODT) 4 MG disintegrating tablet Take 1 tablet (4 mg total) by mouth every 8 (eight) hours as needed for nausea or vomiting. 11/06/21  Yes Regan Lemming, MD  acetaminophen (TYLENOL) 650 MG CR tablet Take 1,300 mg by mouth every 8 (eight) hours as needed for pain.    [provider]  colchicine 0.6 MG tablet Take 0.6 mg by mouth daily.    [provider]  gabapentin (NEURONTIN) 300 MG capsule Take 300 mg by mouth in the morning and at bedtime.     [provider]  levothyroxine (SYNTHROID) 50 MCG tablet Take 1 tablet by mouth daily. 03/11/19   [provider]  metoprolol succinate (TOPROL XL) 25 MG 24 hr tablet Take 1 tablet (25 mg total) by mouth daily. 10/25/21 01/23/22  Tolia, Sunit, DO  Multiple Vitamins-Minerals (CENTRUM SILVER PO) Take 1 tablet by mouth daily.    [provider]  Pitavastatin Calcium (LIVALO) 2 MG TABS Take 1 tablet (2 mg total) by mouth daily. 09/16/21   Tolia, Sunit, DO  Rivaroxaban  (XARELTO) 15 MG TABS tablet Take 15 mg by mouth daily with supper.    [provider]  sacubitril-valsartan (ENTRESTO) 49-51 MG Take 1 tablet by mouth 2 (two) times daily. 05/21/21   Tolia, Sunit, DO      Allergies    Dilaudid [hydromorphone hcl], Gabapentin, Lipitor [atorvastatin calcium], Lovastatin, Zetia [ezetimibe], and Zocor [simvastatin]    Review of Systems   Review of Systems  Gastrointestinal:  Positive for vomiting.   Physical Exam Updated Vital Signs BP (!) 106/53 (BP Location: Right Arm)   Pulse 82   Temp 98.1 F (36.7 C) (Oral)   Resp 18   Ht '5\' 10"'$  (1.778 m)   Wt 97 kg   SpO2 95%   BMI 30.68 kg/m  Physical Exam Vitals and nursing note reviewed.  Constitutional:      Appearance: She is well-developed. She is obese.  HENT:     Head: Normocephalic and atraumatic.     Mouth/Throat:     Mouth: Mucous membranes are dry.  Eyes:     Conjunctiva/sclera: Conjunctivae normal.  Cardiovascular:     Rate and Rhythm: Normal rate and regular rhythm.  Pulmonary:     Effort: Pulmonary effort is normal. No respiratory distress.     Breath sounds: Normal breath sounds.  Abdominal:     Palpations: Abdomen is soft.     Tenderness: There is abdominal tenderness in the left lower quadrant. There is no guarding or rebound.  Musculoskeletal:  General: No swelling.     Cervical back: Neck supple.  Skin:    General: Skin is warm and dry.     Capillary Refill: Capillary refill takes less than 2 seconds.  Neurological:     Mental Status: She is alert.  Psychiatric:        Mood and Affect: Mood normal.    ED Results / Procedures / Treatments   Labs (all labs ordered are listed, but only abnormal results are displayed) Labs Reviewed  COMPREHENSIVE METABOLIC PANEL - Abnormal; Notable for the following components:      Result Value   Chloride 112 (*)    CO2 21 (*)    Glucose, Bld 164 (*)    Calcium 8.6 (*)    Total Protein 5.6 (*)    All other components  within normal limits  CBC - Abnormal; Notable for the following components:   MCV 101.0 (*)    Platelets 105 (*)    All other components within normal limits  URINALYSIS, ROUTINE W REFLEX MICROSCOPIC - Abnormal; Notable for the following components:   Ketones, ur 5 (*)    Bacteria, UA RARE (*)    All other components within normal limits  BRAIN NATRIURETIC PEPTIDE - Abnormal; Notable for the following components:   B Natriuretic Peptide 145.3 (*)    All other components within normal limits  RESP PANEL BY RT-PCR (FLU A&B, COVID) ARPGX2  LIPASE, BLOOD  MAGNESIUM  TROPONIN I (HIGH SENSITIVITY)  TROPONIN I (HIGH SENSITIVITY)    EKG EKG Interpretation  Date/Time:  Wednesday Nov 06 2021 17:14:04 EDT Ventricular Rate:  86 PR Interval:  191 QRS Duration: 157 QT Interval:  436 QTC Calculation: 522 R Axis:   32 Text Interpretation: Sinus rhythm Premature ventricular complexes Right bundle branch block Confirmed by Regan Lemming ([id number]) on 11/06/2021 6:39:26 PM  Radiology CT ABDOMEN PELVIS W CONTRAST  Result Date: 11/06/2021 CLINICAL DATA:  Left lower quadrant abdominal pain, nausea/vomiting EXAM: CT ABDOMEN AND PELVIS WITH CONTRAST TECHNIQUE: Multidetector CT imaging of the abdomen and pelvis was performed using the standard protocol following bolus administration of intravenous contrast. RADIATION DOSE REDUCTION: This exam was performed according to the departmental dose-optimization program which includes automated exposure control, adjustment of the mA and/or kV according to patient size and/or use of iterative reconstruction technique. CONTRAST:  146m OMNIPAQUE IOHEXOL 300 MG/ML  SOLN COMPARISON:  01/11/2016 FINDINGS: Lower chest: Very mild bilateral lower lobe atelectasis. Hepatobiliary: Liver is within normal limits. Status post cholecystectomy no intrahepatic or extrahepatic ductal dilatation. Pancreas: 15 mm pancreatic cyst along the pancreatic tail (series 3/image 28). No main  pancreatic ductal dilatation. Given the patient's age, no follow-up is recommended. Spleen: Within normal limits. Adrenals/Urinary Tract: Adrenal glands are within normal limits. Right renal cysts, including a dominant 4.3 cm cyst in the anterior right upper kidney (series 3/image 25). Left kidney is within normal limits. No hydronephrosis. Bladder is within normal limits. Stomach/Bowel: Stomach is within normal limits. No evidence of bowel obstruction. Appendix is not discretely visualized, reportedly surgically absent. Scattered mild colonic diverticulosis, without evidence of diverticulitis. Vascular/Lymphatic: No evidence of abdominal aortic aneurysm. Atherosclerotic calcifications of the abdominal aorta and branch vessels. No suspicious abdominopelvic lymphadenopathy. Reproductive: Status post hysterectomy. No adnexal masses. Other: No abdominopelvic ascites. Musculoskeletal: Mild degenerative changes of the visualized thoracolumbar spine. IMPRESSION: Mild left colonic diverticulosis, without evidence of diverticulitis. No CT findings to account for the patient's left lower quadrant abdominal pain. Additional ancillary findings as above. Electronically Signed  By: Julian Hy M.D.   On: 11/06/2021 19:06   DG Chest Portable 1 View  Result Date: 11/06/2021 CLINICAL DATA:  Shortness of breath and emesis. EXAM: PORTABLE CHEST 1 VIEW COMPARISON:  None Available. FINDINGS: Mild cardiac enlargement. Bibasilar atelectasis. There is no evidence of pulmonary edema, consolidation, pneumothorax or pleural fluid. Visualized bony structures are unremarkable. IMPRESSION: Mild cardiac enlargement and bibasilar atelectasis. Electronically Signed   By: Aletta Edouard M.D.   On: 11/06/2021 17:49    Procedures Procedures    Medications Ordered in ED Medications  prochlorperazine (COMPAZINE) injection 10 mg (10 mg Intravenous Given 11/06/21 1718)  fentaNYL (SUBLIMAZE) injection 50 mcg (50 mcg Intravenous Given  11/06/21 1718)  lactated ringers bolus 1,000 mL (0 mLs Intravenous Stopped 11/06/21 1941)  iohexol (OMNIPAQUE) 300 MG/ML solution 100 mL (100 mLs Intravenous Contrast Given 11/06/21 1854)    ED Course/ Medical Decision Making/ A&P                           Medical Decision Making Problems Addressed: Gastroenteritis: acute illness or injury Nausea vomiting and diarrhea: acute illness or injury with systemic symptoms  Amount and/or Complexity of Data Reviewed Independent Historian: parent External Data Reviewed: labs and radiology. Labs: ordered. Radiology: ordered.  Risk Prescription drug management.   88 year old female with medical history significant for hypertension, familial Mediterranean fever, DVT and PE on Xarelto, fibromyalgia, hypothyroidism, GERD, migraine headaches, HLD, who presents to the emergency department with a chief complaint of 1 day of nausea, vomiting, diarrhea and lightheadedness.  The patient states that she is having some shortness of breath and chest discomfort as well.  She has had decreased oral intake over the past 24 hours.  She has had multiple episodes of NBNB emesis with multiple episodes of watery diarrhea.  She denies any fevers or chills.  On arrival, the patient was afebrile, hemodynamically stable, not tachycardic, mildly tachypneic, saturating well on room air.  Sinus rhythm noted on cardiac telemetry.  Patient presenting with nausea, vomiting, diarrhea consistent with likely gastroenteritis.  Some abdominal tenderness on exam in the left lower quadrant so CT imaging was ordered.  The patient was found to have mild left colonic diverticulosis without evidence of diverticulitis.  Laboratory work-up interpreted by myself negative for urinary tract infection, CBC without a leukocytosis or anemia, COVID-19 influenza PCR testing negative, troponin negative x2, BNP mildly elevated to 145 that is nonspecific, magnesium normal.  Patient overall well-appearing  following IV fluids, IV Compazine, IV fentanyl.  With nausea, vomiting, diarrhea, suspect likely viral gastroenteritis and dehydration.  Zofran prescribed and the patient was advised to continue oral rehydration at home with return precautions in the event of worsening symptoms, inability to tolerate oral intake.   The patient has been appropriately medically screened and/or stabilized in the ED. I have low suspicion for any other emergent medical condition which would require further screening, evaluation or treatment in the ED or require inpatient management.  Final Clinical Impression(s) / ED Diagnoses Final diagnoses:  Nausea vomiting and diarrhea  Gastroenteritis    Rx / DC Orders ED Discharge Orders          Ordered    ondansetron (ZOFRAN-ODT) 4 MG disintegrating tablet  Every 8 hours PRN        11/06/21 2052              Regan Lemming, MD 11/07/21 0200

## 2021-11-08 NOTE — Telephone Encounter (Signed)
I reviewed the ED documentation.    Breianna Delfino Harwick, DO, [facility]

## 2021-11-11 ENCOUNTER — Other Ambulatory Visit: Payer: Self-pay

## 2021-11-11 DIAGNOSIS — R0609 Other forms of dyspnea: Secondary | ICD-10-CM

## 2021-11-11 DIAGNOSIS — I5042 Chronic combined systolic (congestive) and diastolic (congestive) heart failure: Secondary | ICD-10-CM

## 2021-11-11 MED ORDER — ENTRESTO 49-51 MG PO TABS: 1.0000 | ORAL_TABLET | Freq: Two times a day (BID) | ORAL | 3 refills | Status: DC

## 2021-11-13 DIAGNOSIS — I5022 Chronic systolic (congestive) heart failure: Secondary | ICD-10-CM | POA: Diagnosis not present

## 2021-11-18 ENCOUNTER — Other Ambulatory Visit: Payer: Self-pay

## 2021-11-18 DIAGNOSIS — R0609 Other forms of dyspnea: Secondary | ICD-10-CM

## 2021-11-18 DIAGNOSIS — I5042 Chronic combined systolic (congestive) and diastolic (congestive) heart failure: Secondary | ICD-10-CM

## 2021-12-13 DIAGNOSIS — E785 Hyperlipidemia, unspecified: Secondary | ICD-10-CM | POA: Diagnosis not present

## 2021-12-13 DIAGNOSIS — E039 Hypothyroidism, unspecified: Secondary | ICD-10-CM | POA: Diagnosis not present

## 2021-12-13 DIAGNOSIS — M041 Periodic fever syndromes: Secondary | ICD-10-CM | POA: Diagnosis not present

## 2021-12-13 DIAGNOSIS — I5022 Chronic systolic (congestive) heart failure: Secondary | ICD-10-CM | POA: Diagnosis not present

## 2021-12-13 DIAGNOSIS — M1990 Unspecified osteoarthritis, unspecified site: Secondary | ICD-10-CM | POA: Diagnosis not present

## 2021-12-13 DIAGNOSIS — Z6830 Body mass index (BMI) 30.0-30.9, adult: Secondary | ICD-10-CM | POA: Diagnosis not present

## 2021-12-13 DIAGNOSIS — I509 Heart failure, unspecified: Secondary | ICD-10-CM | POA: Diagnosis not present

## 2021-12-13 DIAGNOSIS — R32 Unspecified urinary incontinence: Secondary | ICD-10-CM | POA: Diagnosis not present

## 2021-12-13 DIAGNOSIS — Z823 Family history of stroke: Secondary | ICD-10-CM | POA: Diagnosis not present

## 2021-12-13 DIAGNOSIS — E669 Obesity, unspecified: Secondary | ICD-10-CM | POA: Diagnosis not present

## 2021-12-13 DIAGNOSIS — R030 Elevated blood-pressure reading, without diagnosis of hypertension: Secondary | ICD-10-CM | POA: Diagnosis not present

## 2021-12-13 DIAGNOSIS — Z7901 Long term (current) use of anticoagulants: Secondary | ICD-10-CM | POA: Diagnosis not present

## 2021-12-13 DIAGNOSIS — G629 Polyneuropathy, unspecified: Secondary | ICD-10-CM | POA: Diagnosis not present

## 2021-12-16 DIAGNOSIS — E782 Mixed hyperlipidemia: Secondary | ICD-10-CM | POA: Diagnosis not present

## 2021-12-16 DIAGNOSIS — I5042 Chronic combined systolic (congestive) and diastolic (congestive) heart failure: Secondary | ICD-10-CM | POA: Diagnosis not present

## 2021-12-16 DIAGNOSIS — R0609 Other forms of dyspnea: Secondary | ICD-10-CM | POA: Diagnosis not present

## 2021-12-17 LAB — CMP14+EGFR
ALT: 7 IU/L (ref 0–32)
AST: 20 IU/L (ref 0–40)
Albumin/Globulin Ratio: 2.4 — ABNORMAL HIGH (ref 1.2–2.2)
Albumin: 4.3 g/dL (ref 3.7–4.7)
Alkaline Phosphatase: 61 IU/L (ref 44–121)
BUN/Creatinine Ratio: 12 (ref 12–28)
BUN: 10 mg/dL (ref 8–27)
Bilirubin Total: 0.9 mg/dL (ref 0.0–1.2)
CO2: 23 mmol/L (ref 20–29)
Calcium: 9.6 mg/dL (ref 8.7–10.3)
Chloride: 106 mmol/L (ref 96–106)
Creatinine, Ser: 0.83 mg/dL (ref 0.57–1.00)
Globulin, Total: 1.8 g/dL (ref 1.5–4.5)
Glucose: 112 mg/dL — ABNORMAL HIGH (ref 70–99)
Potassium: 4.8 mmol/L (ref 3.5–5.2)
Sodium: 145 mmol/L — ABNORMAL HIGH (ref 134–144)
Total Protein: 6.1 g/dL (ref 6.0–8.5)
eGFR: 67 mL/min/{1.73_m2} (ref >59)

## 2021-12-17 LAB — LIPID PANEL WITH LDL/HDL RATIO
Cholesterol, Total: 159 mg/dL (ref 100–199)
HDL: 66 mg/dL (ref >39)
LDL Chol Calc (NIH): 79 mg/dL (ref 0–99)
LDL/HDL Ratio: 1.2 ratio (ref 0.0–3.2)
Triglycerides: 71 mg/dL (ref 0–149)
VLDL Cholesterol Cal: 14 mg/dL (ref 5–40)

## 2021-12-17 LAB — HEMOGLOBIN AND HEMATOCRIT, BLOOD
Hematocrit: 43.7 % (ref 34.0–46.6)
Hemoglobin: 14.9 g/dL (ref 11.1–15.9)

## 2021-12-17 LAB — PRO B NATRIURETIC PEPTIDE: NT-Pro BNP: 360 pg/mL (ref 0–738)

## 2021-12-17 LAB — LDL CHOLESTEROL, DIRECT: LDL Direct: 78 mg/dL (ref 0–99)

## 2021-12-20 NOTE — Progress Notes (Signed)
Called and spoke to patient she voiced understanding

## 2021-12-31 DIAGNOSIS — I5042 Chronic combined systolic (congestive) and diastolic (congestive) heart failure: Secondary | ICD-10-CM | POA: Diagnosis not present

## 2021-12-31 DIAGNOSIS — E039 Hypothyroidism, unspecified: Secondary | ICD-10-CM | POA: Diagnosis not present

## 2021-12-31 DIAGNOSIS — I10 Essential (primary) hypertension: Secondary | ICD-10-CM | POA: Diagnosis not present

## 2021-12-31 DIAGNOSIS — E7800 Pure hypercholesterolemia, unspecified: Secondary | ICD-10-CM | POA: Diagnosis not present

## 2022-01-02 ENCOUNTER — Encounter: Payer: Self-pay | Admitting: Cardiology

## 2022-01-02 ENCOUNTER — Ambulatory Visit: Payer: Medicare HMO | Admitting: Cardiology

## 2022-01-02 VITALS — BP 126/54 | HR 85 | Temp 97.8°F | Resp 16 | Ht 70.0 in | Wt 213.4 lb

## 2022-01-02 DIAGNOSIS — Z86718 Personal history of other venous thrombosis and embolism: Secondary | ICD-10-CM

## 2022-01-02 DIAGNOSIS — Z6830 Body mass index (BMI) 30.0-30.9, adult: Secondary | ICD-10-CM | POA: Diagnosis not present

## 2022-01-02 DIAGNOSIS — I10 Essential (primary) hypertension: Secondary | ICD-10-CM | POA: Diagnosis not present

## 2022-01-02 DIAGNOSIS — E6609 Other obesity due to excess calories: Secondary | ICD-10-CM

## 2022-01-02 DIAGNOSIS — E782 Mixed hyperlipidemia: Secondary | ICD-10-CM

## 2022-01-02 DIAGNOSIS — Z86711 Personal history of pulmonary embolism: Secondary | ICD-10-CM

## 2022-01-02 DIAGNOSIS — I5042 Chronic combined systolic (congestive) and diastolic (congestive) heart failure: Secondary | ICD-10-CM | POA: Diagnosis not present

## 2022-01-02 NOTE — Progress Notes (Signed)
Debbie Richards Date of Birth: [birth year] MRN: [id number] Primary Care Provider:Meyers, Annie Main, MD Former Cardiology Providers: Sinclair Grooms, MD, Sueanne Margarita, MD, Jeri Lager, APRN, FNP-C Primary Cardiologist: Rex Kras, DO, [facility] (established care 11/10/2019)  Date: 01/02/22 Last Office Visit: 07/04/2021  Chief Complaint  Patient presents with    heart failure management   Follow-up    HPI  Debbie Richards is a 88 y.o.  female whose past medical history and cardiovascular risk factors include: Chronic combined systolic and diastolic heart failure, stage B, NYHA class II, hypertension, hypothyroidism, prediabetes, diabetic neuropathy, familial Mediterranean fever, history of DVT, history of PE, postmenopausal female, advanced age.   Patient is accompanied with her daughter Debbie Richards at today's office visit.  Patient presents today for 52-monthfollow-up visit for management of chronic combined systolic and diastolic heart failure.  Since last office visit patient's weight remains stable, recent labs from July 2023 independently reviewed.  Hemoglobin, LDL, and NT proBNP are all within acceptable limits.  Clinically she denies anginal discomfort or heart failure symptoms.  FUNCTIONAL STATUS: She able to garden and maintain her home. No structured exercise program or daily routine.    ALLERGIES: Allergies  Allergen Reactions   Dilaudid [Hydromorphone Hcl] Other (See Comments)    Flushing, hallucinations   Gabapentin Other (See Comments)    RLS with burning   Lipitor [Atorvastatin Calcium] Other (See Comments)    Muscle aches    Lovastatin Other (See Comments)    Muscle pain   Zetia [Ezetimibe] Other (See Comments)    Muscles aches   Zocor [Simvastatin] Other (See Comments)    Muscle Aches     MEDICATION LIST PRIOR TO VISIT: Current Outpatient Medications on File Prior to Visit  Medication Sig Dispense Refill   acetaminophen (TYLENOL) 650 MG CR tablet Take 1,300 mg by  mouth every 8 (eight) hours as needed for pain.     colchicine 0.6 MG tablet Take 0.6 mg by mouth daily.     gabapentin (NEURONTIN) 300 MG capsule Take 300 mg by mouth in the morning and at bedtime.      levothyroxine (SYNTHROID) 50 MCG tablet Take 1 tablet by mouth daily.     metoprolol succinate (TOPROL XL) 25 MG 24 hr tablet Take 1 tablet (25 mg total) by mouth daily. 90 tablet 2   Multiple Vitamins-Minerals (CENTRUM SILVER PO) Take 1 tablet by mouth daily.     ondansetron (ZOFRAN-ODT) 4 MG disintegrating tablet Take 1 tablet (4 mg total) by mouth every 8 (eight) hours as needed for nausea or vomiting. 20 tablet 0   Pitavastatin Calcium (LIVALO) 2 MG TABS Take 1 tablet (2 mg total) by mouth daily. 90 tablet 3   Rivaroxaban (XARELTO) 15 MG TABS tablet Take 15 mg by mouth daily with supper.     sacubitril-valsartan (ENTRESTO) 49-51 MG Take 1 tablet by mouth 2 (two) times daily. 180 tablet 3   No current facility-administered medications on file prior to visit.    PAST MEDICAL HISTORY: Past Medical History:  Diagnosis Date   Borderline diabetes    Chronic back pain    Chronic lower back pain    [place]    [place] ([place]) [place]    [place], [place] 01[place]; 06/2003   RLE; LLE   Enlarged thyroid gland    Familial Mediterranean fever (HCC)    Fibromyalgia    Fibromyalgia    Gallstones    GERD (gastroesophageal reflux  disease)    GERD (gastroesophageal reflux disease)    H/O hiatal hernia    History of stomach ulcers    Hyperlipidemia    Hypertension    Hypothyroidism    Insomnia    Migraine    "I've had 4; last time was when I was in my ?73's"   Osteoarthritis    Phlebitis    LLE   Pneumonia    Pulmonary embolism ([place]) 2003   Shortness of breath on exertion    Tinnitus     PAST SURGICAL HISTORY: Past Surgical History:  Procedure Laterality Date   ABDOMINAL HYSTERECTOMY  1972   APPENDECTOMY  1960   bladder tack     BREAST BIOPSY      left   CARPAL TUNNEL RELEASE  1989   right   CATARACT EXTRACTION, BILATERAL  ~ 2011   CHOLECYSTECTOMY  09/12/2011   Procedure: LAPAROSCOPIC CHOLECYSTECTOMY WITH INTRAOPERATIVE CHOLANGIOGRAM;  Surgeon: Shann Medal, MD;  Location: [place];  Service: General;  Laterality: N/A;   excision of goiter     EXTERNAL EAR SURGERY     x3   HERNIA REPAIR  1970's   ventral   KNEE ARTHROSCOPY Left 12/05/2019   Procedure: ARTHROSCOPY KNEE AND DEBRIDEMENT, partial medial and lateral meniscectomy, left femoral patella chondroplasty;  Surgeon: Dorna Leitz, MD;  Location: [place];  Service: Orthopedics;  Laterality: Left;   MASS EXCISION  12/2001   paravertebral chest mass   MIDDLE EAR SURGERY  1959; 1982; after 1982   mini thoracotomy  12/2001   THORACOTOMY  2003   procedure done   to remove growth in lung    TOTAL KNEE ARTHROPLASTY Right 01/30/2017   Procedure: RIGHT TOTAL KNEE ARTHROPLASTY;  Surgeon: Dorna Leitz, MD;  Location: [facility];  Service: Orthopedics;  Laterality: Right;  With adductor canal block    FAMILY HISTORY: The patient's family history includes Dementia in her sister; Heart disease in her mother; Hyperlipidemia in her mother; Hypertension in her mother; Parkinson's disease in her brother, sister, and sister; Stroke in her father.   SOCIAL HISTORY:  The patient  reports that she has never smoked. She has never used smokeless tobacco. She reports that she does not drink alcohol and does not use drugs.  Review of Systems  Constitutional: Negative for chills, fever and malaise/fatigue.  HENT:  Negative for hoarse voice and nosebleeds.   Eyes:  Negative for discharge, double vision and pain.  Cardiovascular:  Positive for dyspnea on exertion (improving) and leg swelling (improving). Negative for chest pain, claudication, near-syncope, orthopnea, palpitations, paroxysmal nocturnal dyspnea and syncope.  Respiratory:  Negative for hemoptysis and shortness of breath.    Musculoskeletal:  Positive for joint pain and joint swelling. Negative for muscle cramps and myalgias.  Gastrointestinal:  Negative for abdominal pain, constipation, diarrhea, hematemesis, hematochezia, melena, nausea and vomiting.  Neurological:  Negative for dizziness and light-headedness.    PHYSICAL EXAM:    01/02/2022   11:37 AM 11/06/2021    9:23 PM 11/06/2021    9:00 PM  Vitals with BMI  Height '5\' 10"'$     Weight 213 lbs 6 oz    BMI 81.82    Systolic 993 716 967  Diastolic 54 53 55  Pulse 85 82 80    CONSTITUTIONAL: Age-appropriate, ambulates with a cane, hemodynamically stable,  SKIN: Skin is warm and dry. No rash noted. No cyanosis. No pallor. No jaundice HEAD: Normocephalic and atraumatic.  EYES: No scleral icterus MOUTH/THROAT:  Moist oral membranes.  NECK: No JVD present. No thyromegaly noted. No carotid bruits  CHEST Normal respiratory effort. No intercostal retractions  LUNGS: Clear to auscultation bilaterally.  No stridor. No wheezes. No rales.  CARDIOVASCULAR: Regular rate and rhythm, positive S1-S2, no murmurs rubs or gallops appreciated ABDOMINAL: .Obese, soft, nontender, nondistended, positive bowel sounds all 4 quadrants.No apparent ascites.  EXTREMITIES: Bilateral trace of peripheral edema, warm to touch, tender to touch, skin changes suggestive of chronic venous stasis. HEMATOLOGIC: No significant bruising NEUROLOGIC: Oriented to person, place, and time. Nonfocal. Normal muscle tone.  PSYCHIATRIC: Normal mood and affect. Normal behavior. Cooperative  CARDIAC DATABASE: EKG: 01/02/2022: NSR, 74 bpm, right bundle branch block, occasional PACs.  Echocardiogram: 09/2011: LVEF 55-60% mildly dilated left atrium, PASP 33 mmHg.  11/14/2019: LVEF 83-41%, grade 2 diastolic dysfunction, elevated LAP, severe LAE, mild MR, mild TR, RVSP 21 mmHg.  01/07/2021: Left ventricle cavity is normal in size. Moderate concentric hypertrophy of the left ventricle. Mild global  hypokinesis. LVEF 45-50%. Doppler evidence of grade I (impaired) diastolic dysfunction, normal LAP.  Mild tricuspid regurgitation.  Trace pericardial effusion. No evidence of pulmonary hypertension. Compared to previous study, trace pericardial effusion is new.  Stress Testing:  Lexiscan/modified Bruce Tetrofosmin stress test 11/23/2019:  Lexiscan nuclear stress test performed using 1-day protocol. Stress EKG is non-diagnostic, as this is pharmacological stress test. In addition, rest and stress EKG showed sinus rhythm, anterosetpal nonspecific T wave inversion, frequent PVC's.  Decreased counts in inferior/inferolateral myocardium seen worse at rest, likely due to breast tissue attenuation with images performed in sitting position. Stress LVEF 48%.  Intermediate risk study. Recommend clinical correlation.    Heart Catheterization: None  LABORATORY DATA:    Latest Ref Rng & Units 12/16/2021    7:34 AM 11/06/2021    4:01 PM 02/02/2017    3:56 AM  CBC  WBC 4.0 - 10.5 K/uL  5.7  7.9   Hemoglobin 11.1 - 15.9 g/dL 14.9  13.1  11.8   Hematocrit 34.0 - 46.6 % 43.7  41.2  35.4   Platelets 150 - 400 K/uL  105  123        Latest Ref Rng & Units 12/16/2021    7:33 AM 11/06/2021    4:01 PM 06/28/2021    9:09 AM  CMP  Glucose 70 - 99 mg/dL 112  164  113   BUN 8 - 27 mg/dL '10  16  17   '$ Creatinine 0.57 - 1.00 mg/dL 0.83  0.65  0.93   Sodium 134 - 144 mmol/L 145  141  142   Potassium 3.5 - 5.2 mmol/L 4.8  4.0  4.1   Chloride 96 - 106 mmol/L 106  112  104   CO2 20 - 29 mmol/L '23  21  25   '$ Calcium 8.7 - 10.3 mg/dL 9.6  8.6  8.9   Total Protein 6.0 - 8.5 g/dL 6.1  5.6    Total Bilirubin 0.0 - 1.2 mg/dL 0.9  0.5    Alkaline Phos 44 - 121 IU/L 61  51    AST 0 - 40 IU/L 20  20    ALT 0 - 32 IU/L 7  11      Lipid Panel     Component Value Date/Time   CHOL 159 12/16/2021 0735   TRIG 71 12/16/2021 0735   HDL 66 12/16/2021 0735   LDLCALC 79 12/16/2021 0735   LDLDIRECT 78 12/16/2021 0736    LABVLDL 14 12/16/2021 0735  Lab Results  Component Value Date   HGBA1C 5.8 (H) 01/23/2017   No components found for: "NTPROBNP" No results found for: "TSH"  Cardiac Panel (last 3 results) No results for input(s): "CKTOTAL", "CKMB", "TROPONINIHS", "RELINDX" in the last 72 hours.  IMPRESSION:    ICD-10-CM   1. Chronic combined systolic and diastolic heart failure (HCC)  I50.42 EKG 12-Lead    2. Mixed hyperlipidemia  E78.2     3. Essential hypertension  I10     4. Hx pulmonary embolism  Z86.711     5. Hx of deep venous thrombosis  Z86.718     6. Class 1 obesity due to excess calories without serious comorbidity with body mass index (BMI) of 30.0 to 30.9 in adult  E66.09    Z68.30        RECOMMENDATIONS: AKEYA RYTHER is a 88 y.o. female whose past medical history and cardiovascular risk factors include: Chronic combined systolic and diastolic heart failure, stage B, NYHA class II/III, hypertension, hypothyroidism, prediabetes, diabetic neuropathy, familial Mediterranean fever, history of DVT, history of PE, postmenopausal female, advanced age.   Chronic combined systolic and diastolic heart failure (HCC) Stage B, NYHA class II Her weight remains stable since last office visit 6 months ago. NT proBNP within normal limits. Medications reconciled. Was on Farxiga in the past but discontinued secondary to lightheadedness/dizziness/AKI. We will avoid up titration of GDMT as she complains of feeling lightheaded and dizzy at times her blood pressures are soft. Patient complains of sleep disturbances, frequently waking up, and not getting a good night rest.  Recommend sleep study to evaluate for sleep apnea given her age, oral anatomy, and comorbidities.  Patient and daughter to discuss this further.  We will either reach out to [place] or PCP for further guidance Echo results from August 2022 independently reviewed and noted above. No additional diagnostic testing warranted at this  time as she is clinically stable  Mixed hyperlipidemia In the past, has been intolerant to Lipitor, Crestor, Zocor.   Continue Livalo.  Independently reviewed labs from 12/16/2021, LDL within acceptable limits.  Essential hypertension Office blood pressures within acceptable limits. Medications reconciled.  Hx pulmonary embolism / Hx of deep venous thrombosis Currently on oral anticoagulation managed by PCP.  Class 1 obesity due to excess calories without serious comorbidity with body mass index (BMI) of 30.0 to 30.9 in adult Body mass index is 30.62 kg/m. I reviewed with the patient the importance of diet, regular physical activity/exercise, weight loss.   Patient is educated on increasing physical activity gradually as tolerated.  With the goal of moderate intensity exercise for 30 minutes a day 5 days a week.  FINAL MEDICATION LIST END OF ENCOUNTER: No orders of the defined types were placed in this encounter.   Current Outpatient Medications:    acetaminophen (TYLENOL) 650 MG CR tablet, Take 1,300 mg by mouth every 8 (eight) hours as needed for pain., Disp: , Rfl:    colchicine 0.6 MG tablet, Take 0.6 mg by mouth daily., Disp: , Rfl:    gabapentin (NEURONTIN) 300 MG capsule, Take 300 mg by mouth in the morning and at bedtime. , Disp: , Rfl:    levothyroxine (SYNTHROID) 50 MCG tablet, Take 1 tablet by mouth daily., Disp: , Rfl:    metoprolol succinate (TOPROL XL) 25 MG 24 hr tablet, Take 1 tablet (25 mg total) by mouth daily., Disp: 90 tablet, Rfl: 2   Multiple Vitamins-Minerals (CENTRUM SILVER PO), Take 1 tablet by mouth  daily., Disp: , Rfl:    ondansetron (ZOFRAN-ODT) 4 MG disintegrating tablet, Take 1 tablet (4 mg total) by mouth every 8 (eight) hours as needed for nausea or vomiting., Disp: 20 tablet, Rfl: 0   Pitavastatin Calcium (LIVALO) 2 MG TABS, Take 1 tablet (2 mg total) by mouth daily., Disp: 90 tablet, Rfl: 3   Rivaroxaban (XARELTO) 15 MG TABS tablet, Take 15 mg by  mouth daily with supper., Disp: , Rfl:    sacubitril-valsartan (ENTRESTO) 49-51 MG, Take 1 tablet by mouth 2 (two) times daily., Disp: 180 tablet, Rfl: 3  Orders Placed This Encounter  Procedures   EKG 12-Lead    --Continue cardiac medications as reconciled in final medication list. --Return in about 6 months (around 07/05/2022) for Follow up HF. Or sooner if needed. --Continue follow-up with your primary care physician regarding the management of your other chronic comorbid conditions.  Patient's questions and concerns were addressed to her satisfaction. She voices understanding of the instructions provided during this encounter.   This note was created using a voice recognition software as a result there may be grammatical errors inadvertently enclosed that do not reflect the nature of this encounter. Every attempt is made to correct such errors.  Rex Kras, [place], [facility]  Pager: [phone] Office: [phone]

## 2022-01-13 DIAGNOSIS — I5022 Chronic systolic (congestive) heart failure: Secondary | ICD-10-CM | POA: Diagnosis not present

## 2022-02-12 DIAGNOSIS — I5022 Chronic systolic (congestive) heart failure: Secondary | ICD-10-CM | POA: Diagnosis not present

## 2022-03-13 DIAGNOSIS — H7292 Unspecified perforation of tympanic membrane, left ear: Secondary | ICD-10-CM | POA: Diagnosis not present

## 2022-03-13 DIAGNOSIS — H6240 Otitis externa in other diseases classified elsewhere, unspecified ear: Secondary | ICD-10-CM | POA: Diagnosis not present

## 2022-03-13 DIAGNOSIS — B488 Other specified mycoses: Secondary | ICD-10-CM | POA: Diagnosis not present

## 2022-03-14 DIAGNOSIS — I5022 Chronic systolic (congestive) heart failure: Secondary | ICD-10-CM | POA: Diagnosis not present

## 2022-04-03 DIAGNOSIS — I5042 Chronic combined systolic (congestive) and diastolic (congestive) heart failure: Secondary | ICD-10-CM | POA: Diagnosis not present

## 2022-04-03 DIAGNOSIS — E7800 Pure hypercholesterolemia, unspecified: Secondary | ICD-10-CM | POA: Diagnosis not present

## 2022-04-03 DIAGNOSIS — E042 Nontoxic multinodular goiter: Secondary | ICD-10-CM | POA: Diagnosis not present

## 2022-04-03 DIAGNOSIS — Z0000 Encounter for general adult medical examination without abnormal findings: Secondary | ICD-10-CM | POA: Diagnosis not present

## 2022-04-03 DIAGNOSIS — G629 Polyneuropathy, unspecified: Secondary | ICD-10-CM | POA: Diagnosis not present

## 2022-04-03 DIAGNOSIS — Z23 Encounter for immunization: Secondary | ICD-10-CM | POA: Diagnosis not present

## 2022-04-03 DIAGNOSIS — I4891 Unspecified atrial fibrillation: Secondary | ICD-10-CM | POA: Diagnosis not present

## 2022-04-03 DIAGNOSIS — D6869 Other thrombophilia: Secondary | ICD-10-CM | POA: Diagnosis not present

## 2022-04-03 DIAGNOSIS — E850 Non-neuropathic heredofamilial amyloidosis: Secondary | ICD-10-CM | POA: Diagnosis not present

## 2022-04-03 DIAGNOSIS — I10 Essential (primary) hypertension: Secondary | ICD-10-CM | POA: Diagnosis not present

## 2022-04-11 DIAGNOSIS — E039 Hypothyroidism, unspecified: Secondary | ICD-10-CM | POA: Diagnosis not present

## 2022-04-11 DIAGNOSIS — I5042 Chronic combined systolic (congestive) and diastolic (congestive) heart failure: Secondary | ICD-10-CM | POA: Diagnosis not present

## 2022-04-11 DIAGNOSIS — I10 Essential (primary) hypertension: Secondary | ICD-10-CM | POA: Diagnosis not present

## 2022-04-11 DIAGNOSIS — E7800 Pure hypercholesterolemia, unspecified: Secondary | ICD-10-CM | POA: Diagnosis not present

## 2022-04-11 DIAGNOSIS — K219 Gastro-esophageal reflux disease without esophagitis: Secondary | ICD-10-CM | POA: Diagnosis not present

## 2022-04-11 DIAGNOSIS — I4891 Unspecified atrial fibrillation: Secondary | ICD-10-CM | POA: Diagnosis not present

## 2022-04-14 DIAGNOSIS — I5022 Chronic systolic (congestive) heart failure: Secondary | ICD-10-CM | POA: Diagnosis not present

## 2022-05-14 DIAGNOSIS — I5022 Chronic systolic (congestive) heart failure: Secondary | ICD-10-CM | POA: Diagnosis not present

## 2022-05-19 ENCOUNTER — Telehealth: Payer: Self-pay

## 2022-05-19 NOTE — Telephone Encounter (Signed)
I spoke with the patient for a BP alert that was uncharacteristically low for patient's normal trends.  She reports to have been sick to her stomach the last 48 hours and had episodes of vomiting yesterday; she reporting to be feeling just overall weak and was not eating or drinking much yesterday. Patient did hold her Entresto dose yesterday (hold for SBP<100). Patient denies fever, HA, or symptoms of pre-syncope.   Patient mentioned to me her daughter called the office - asked patient regarding the vision changes. She stated when she was feeling really ill yesterday her vision wasn't steady when looking at the television. Patient stated she is starting to feel better and has been able to eat this morning.   05/19/2022 Monday at 05:20 AM 98 / 60      05/18/2022 'Sunday at 05:14 PM 90 / 53      05/18/2022 Sunday at 09:09 AM 152 / 83      05/18/2022 Sunday at 05:14 AM 136 / 62      05/17/2022 Saturday at 04:14 PM 162 / 84      05/17/2022 Saturday at 05:02 AM 128 / 70      05/16/2022 Friday at 05:21 PM 137 / 71      05/16/2022 Friday at 05:12 AM 137 / 73      05/15/2022 Thursday at 05:17 PM 129 / 69      05/15/2022 Thursday at 05:10 AM 118 / 65      12'$ /11/2021 Wednesday at 05:18 PM 136 / 69

## 2022-05-19 NOTE — Telephone Encounter (Signed)
Patients daughter calling about the patient experiencing high BP (157/84). She is experiencing vomiting with vision changes as well, that she believes is from the increased blood pressure. Patients daughter explained that the patient feels better after vomiting, and that the vision changes do not last long before returning to normal. This has happened in the past and when she went to the ER they diagnosed her with gastritis.   Please advise and I will call the patients daughter back.  DaughterMaudie [phone]

## 2022-05-20 ENCOUNTER — Telehealth: Payer: Self-pay | Admitting: *Deleted

## 2022-05-20 NOTE — Patient Outreach (Signed)
  Care Coordination   05/20/2022 Name: AUDREANNA TORRISI MRN: [id number] DOB: 1930/08/10   Care Coordination Outreach Attempts:  An unsuccessful telephone outreach was attempted today to offer the patient information about available care coordination services as a benefit of their health plan.   Follow Up Plan:  Additional outreach attempts will be made to offer the patient care coordination information and services.   Encounter Outcome:  No Answer   Care Coordination Interventions:  No, not indicated    Raina Mina, RN Care Management Coordinator [place] Office [phone]

## 2022-05-22 NOTE — Telephone Encounter (Signed)
Yes, talk to her.  Given her age soft BP will predispose her to falls.  Debbie Negash [place], DO, [facility]

## 2022-05-22 NOTE — Telephone Encounter (Signed)
Have her hold her BP related medication for now, especially if SBP is less than 120 mmHg. With regards to his vision changes I will defer workup to PCP and if progressive needs to go to ED for further evaluation.  Rowan Blaker [place], DO, [facility]

## 2022-06-14 DIAGNOSIS — I5022 Chronic systolic (congestive) heart failure: Secondary | ICD-10-CM | POA: Diagnosis not present

## 2022-06-19 DIAGNOSIS — I4891 Unspecified atrial fibrillation: Secondary | ICD-10-CM | POA: Diagnosis not present

## 2022-06-19 DIAGNOSIS — I5042 Chronic combined systolic (congestive) and diastolic (congestive) heart failure: Secondary | ICD-10-CM | POA: Diagnosis not present

## 2022-06-19 DIAGNOSIS — E7800 Pure hypercholesterolemia, unspecified: Secondary | ICD-10-CM | POA: Diagnosis not present

## 2022-06-19 DIAGNOSIS — K219 Gastro-esophageal reflux disease without esophagitis: Secondary | ICD-10-CM | POA: Diagnosis not present

## 2022-06-19 DIAGNOSIS — I10 Essential (primary) hypertension: Secondary | ICD-10-CM | POA: Diagnosis not present

## 2022-06-19 DIAGNOSIS — E039 Hypothyroidism, unspecified: Secondary | ICD-10-CM | POA: Diagnosis not present

## 2022-06-30 DIAGNOSIS — R2689 Other abnormalities of gait and mobility: Secondary | ICD-10-CM | POA: Diagnosis not present

## 2022-06-30 DIAGNOSIS — Z008 Encounter for other general examination: Secondary | ICD-10-CM | POA: Diagnosis not present

## 2022-06-30 DIAGNOSIS — E669 Obesity, unspecified: Secondary | ICD-10-CM | POA: Diagnosis not present

## 2022-06-30 DIAGNOSIS — M1990 Unspecified osteoarthritis, unspecified site: Secondary | ICD-10-CM | POA: Diagnosis not present

## 2022-06-30 DIAGNOSIS — I110 Hypertensive heart disease with heart failure: Secondary | ICD-10-CM | POA: Diagnosis not present

## 2022-06-30 DIAGNOSIS — G603 Idiopathic progressive neuropathy: Secondary | ICD-10-CM | POA: Diagnosis not present

## 2022-06-30 DIAGNOSIS — I4891 Unspecified atrial fibrillation: Secondary | ICD-10-CM | POA: Diagnosis not present

## 2022-06-30 DIAGNOSIS — I739 Peripheral vascular disease, unspecified: Secondary | ICD-10-CM | POA: Diagnosis not present

## 2022-06-30 DIAGNOSIS — E039 Hypothyroidism, unspecified: Secondary | ICD-10-CM | POA: Diagnosis not present

## 2022-06-30 DIAGNOSIS — Z8249 Family history of ischemic heart disease and other diseases of the circulatory system: Secondary | ICD-10-CM | POA: Diagnosis not present

## 2022-06-30 DIAGNOSIS — R32 Unspecified urinary incontinence: Secondary | ICD-10-CM | POA: Diagnosis not present

## 2022-06-30 DIAGNOSIS — I509 Heart failure, unspecified: Secondary | ICD-10-CM | POA: Diagnosis not present

## 2022-06-30 DIAGNOSIS — E785 Hyperlipidemia, unspecified: Secondary | ICD-10-CM | POA: Diagnosis not present

## 2022-07-04 ENCOUNTER — Ambulatory Visit: Payer: Medicare HMO | Admitting: Cardiology

## 2022-07-04 ENCOUNTER — Encounter: Payer: Self-pay | Admitting: Cardiology

## 2022-07-04 VITALS — BP 118/57 | HR 70 | Ht 70.0 in | Wt 223.0 lb

## 2022-07-04 DIAGNOSIS — Z86718 Personal history of other venous thrombosis and embolism: Secondary | ICD-10-CM

## 2022-07-04 DIAGNOSIS — Z6830 Body mass index (BMI) 30.0-30.9, adult: Secondary | ICD-10-CM | POA: Diagnosis not present

## 2022-07-04 DIAGNOSIS — I5042 Chronic combined systolic (congestive) and diastolic (congestive) heart failure: Secondary | ICD-10-CM | POA: Diagnosis not present

## 2022-07-04 DIAGNOSIS — E6609 Other obesity due to excess calories: Secondary | ICD-10-CM

## 2022-07-04 DIAGNOSIS — I10 Essential (primary) hypertension: Secondary | ICD-10-CM | POA: Diagnosis not present

## 2022-07-04 DIAGNOSIS — E782 Mixed hyperlipidemia: Secondary | ICD-10-CM

## 2022-07-04 DIAGNOSIS — Z86711 Personal history of pulmonary embolism: Secondary | ICD-10-CM

## 2022-07-04 NOTE — Progress Notes (Signed)
Debbie Richards Date of Birth: 10-Apr-1931 MRN: [id number] Primary Care Provider:Meyers, Annie Main, MD Former Cardiology Providers: Sinclair Grooms, MD, Sueanne Margarita, MD, Jeri Lager, APRN, FNP-C Primary Cardiologist: Rex Kras, DO, [facility] (established care 11/10/2019)  Date: 07/04/22 Last Office Visit: 07/04/2021  Chief Complaint  Patient presents with  . Congestive Heart Failure  . Follow-up    6 months    HPI  Debbie Richards is a 89 y.o.  female whose past medical history and cardiovascular risk factors include: Chronic combined systolic and diastolic heart failure, stage B, NYHA class II, hypertension, hypothyroidism, prediabetes, diabetic neuropathy, familial Mediterranean fever, history of DVT, history of PE, postmenopausal female, advanced age.   Patient is accompanied with her daughter [name] at today's office visit.  Patient presents today for 46-monthfollow-up visit for management of chronic combined systolic and diastolic heart failure.  Since last office visit patient's weight remains stable, recent labs from July 2023 independently reviewed.  Hemoglobin, LDL, and NT proBNP are all within acceptable limits.  Clinically she denies anginal discomfort or heart failure symptoms.  FUNCTIONAL STATUS: She able to garden and maintain her home. No structured exercise program or daily routine.    ALLERGIES: Allergies  Allergen Reactions  . Dilaudid [Hydromorphone Hcl] Other (See Comments)    Flushing, hallucinations  . Gabapentin Other (See Comments)    RLS with burning  . Lipitor [Atorvastatin Calcium] Other (See Comments)    Muscle aches   . Lovastatin Other (See Comments)    Muscle pain  . Zetia [Ezetimibe] Other (See Comments)    Muscles aches  . Zocor [Simvastatin] Other (See Comments)    Muscle Aches     MEDICATION LIST PRIOR TO VISIT: Current Outpatient Medications on File Prior to Visit  Medication Sig Dispense Refill  . acetaminophen (TYLENOL) 650 MG CR  tablet Take 1,300 mg by mouth every 8 (eight) hours as needed for pain.    .Marland Kitchencolchicine 0.6 MG tablet Take 0.6 mg by mouth daily.    .Marland Kitchengabapentin (NEURONTIN) 300 MG capsule Take 300 mg by mouth in the morning and at bedtime.     .Marland Kitchenlevothyroxine (SYNTHROID) 50 MCG tablet Take 1 tablet by mouth daily.    . metoprolol succinate (TOPROL XL) 25 MG 24 hr tablet Take 1 tablet (25 mg total) by mouth daily. 90 tablet 2  . Multiple Vitamins-Minerals (CENTRUM SILVER PO) Take 1 tablet by mouth daily.    . ondansetron (ZOFRAN-ODT) 4 MG disintegrating tablet Take 1 tablet (4 mg total) by mouth every 8 (eight) hours as needed for nausea or vomiting. 20 tablet 0  . Pitavastatin Calcium (LIVALO) 2 MG TABS Take 1 tablet (2 mg total) by mouth daily. 90 tablet 3  . Rivaroxaban (XARELTO) 15 MG TABS tablet Take 15 mg by mouth daily with supper.    . sacubitril-valsartan (ENTRESTO) 49-51 MG Take 1 tablet by mouth 2 (two) times daily. 180 tablet 3   No current facility-administered medications on file prior to visit.    PAST MEDICAL HISTORY: Past Medical History:  Diagnosis Date  . Borderline diabetes   . Chronic back pain   . Chronic lower back pain   . [place]otting disorder ([place]   . [place] (DVT) ([place]   . [place], [place] ([place] 01[place]; 06/2003   RLE; LLE  . Enlarged thyroid gland   . F[place]ial Mediterranean fever ([place]   . Fibromyalgia   . Fibromyalgia   . Gallstones   .  GERD (gastroesophageal reflux disease)   . GERD (gastroesophageal reflux disease)   . H/O hiatal hernia   . History of stomach ulcers   . Hyperlipidemia   . Hypertension   . Hypothyroidism   . Insomnia   . Migraine    "I've had 4; last time was when I was in my ?30's"  . Osteoarthritis   . Phlebitis    LLE  . Pneumonia   . Pulmonary embolism ([place]) 2003  . Shortness of breath on exertion   . Tinnitus     PAST SURGICAL HISTORY: Past Surgical History:  Procedure Laterality Date  . ABDOMINAL HYSTERECTOMY  1972   . APPENDECTOMY  1960  . bladder tack    . BREAST BIOPSY     left  . [place]   right  . CATARACT EXTRACTION, BILATERAL  ~ 2011  . CHOLECYSTECTOMY  09/12/2011   Procedure: LAPAROSCOPIC CHOLECYSTECTOMY WITH INTRAOPERATIVE CHOLANGIOGRAM;  Surgeon: Shann Medal, MD;  Location: [place];  Service: General;  Laterality: N/A;  . excision of goiter    . EXTERNAL EAR SURGERY     x3  . HERNIA REPAIR  1970's   ventral  . KNEE ARTHROSCOPY Left 12/05/2019   Procedure: ARTHROSCOPY KNEE AND DEBRIDEMENT, partial medial and lateral meniscectomy, left femoral patella chondroplasty;  Surgeon: Dorna Leitz, MD;  Location: [place];  Service: Orthopedics;  Laterality: Left;  Marland Kitchen MASS EXCISION  12/2001   paravertebral chest mass  . [place]; 1982; after 1982  . mini thoracotomy  12/2001  . THORACOTOMY  2003   procedure done   to remove growth in lung   . TOTAL KNEE ARTHROPLASTY Right 01/30/2017   Procedure: RIGHT TOTAL KNEE ARTHROPLASTY;  Surgeon: Dorna Leitz, MD;  Location: [facility];  Service: Orthopedics;  Laterality: Right;  With adductor canal block    FAMILY HISTORY: The patient's family history includes Dementia in her sister; Heart disease in her mother; Hyperlipidemia in her mother; Hypertension in her mother; Parkinson's disease in her brother, sister, and sister; Stroke in her father.   SOCIAL HISTORY:  The patient  reports that she has never smoked. She has never used smokeless tobacco. She reports that she does not drink alcohol and does not use drugs.  Review of Systems  Constitutional: Negative for chills, fever and malaise/fatigue.  HENT:  Negative for hoarse voice and nosebleeds.   Eyes:  Negative for discharge, double vision and pain.  Cardiovascular:  Positive for dyspnea on exertion (improving) and leg swelling (improving). Negative for chest pain, claudication, near-syncope, orthopnea, palpitations, paroxysmal nocturnal dyspnea and  syncope.  Respiratory:  Negative for hemoptysis and shortness of breath.   Musculoskeletal:  Positive for joint pain and joint swelling. Negative for muscle cramps and myalgias.  Gastrointestinal:  Negative for abdominal pain, constipation, diarrhea, hematemesis, hematochezia, melena, nausea and vomiting.  Neurological:  Negative for dizziness and light-headedness.    PHYSICAL EXAM:    07/04/2022   10:43 AM 01/02/2022   11:37 AM 11/06/2021    9:23 PM  Vitals with BMI  Height '5\' 10"'$  '5\' 10"'$    Weight 223 lbs 213 lbs 6 oz   BMI 32 67.54   Systolic 492 010 071  Diastolic 57 54 53  Pulse 70 85 82    CONSTITUTIONAL: Age-appropriate, ambulates with a cane, hemodynamically stable,  SKIN: Skin is warm and dry. No rash noted. No cyanosis. No pallor. No jaundice HEAD: Normocephalic and atraumatic.  EYES:  No scleral icterus MOUTH/THROAT: Moist oral membranes.  NECK: No JVD present. No thyromegaly noted. No carotid bruits  CHEST Normal respiratory effort. No intercostal retractions  LUNGS: Clear to auscultation bilaterally.  No stridor. No wheezes. No rales.  CARDIOVASCULAR: Regular rate and rhythm, positive S1-S2, no murmurs rubs or gallops appreciated ABDOMINAL: .Obese, soft, nontender, nondistended, positive bowel sounds all 4 quadrants.No apparent ascites.  EXTREMITIES: Bilateral trace of peripheral edema, warm to touch, tender to touch, skin changes suggestive of chronic venous stasis. HEMATOLOGIC: No significant bruising NEUROLOGIC: Oriented to person, place, and time. Nonfocal. Normal muscle tone.  PSYCHIATRIC: Normal mood and affect. Normal behavior. Cooperative  CARDIAC DATABASE: EKG: 01/02/2022: NSR, 74 bpm, right bundle branch block, occasional PACs. 07/04/2022: Sinus rhythm, 72 bpm, right bundle branch block.  Echocardiogram: 09/2011: LVEF 55-60% mildly dilated left atrium, PASP 33 mmHg.  11/14/2019: LVEF 81-85%, grade 2 diastolic dysfunction, elevated LAP, severe LAE, mild MR,  mild TR, RVSP 21 mmHg.  01/07/2021: Left ventricle cavity is normal in size. Moderate concentric hypertrophy of the left ventricle. Mild global hypokinesis. LVEF 45-50%. Doppler evidence of grade I (impaired) diastolic dysfunction, normal LAP.  Mild tricuspid regurgitation.  Trace pericardial effusion. No evidence of pulmonary hypertension. Compared to previous study, trace pericardial effusion is new.  Stress Testing:  Lexiscan/modified Bruce Tetrofosmin stress test 11/23/2019:  Lexiscan nuclear stress test performed using 1-day protocol. Stress EKG is non-diagnostic, as this is pharmacological stress test. In addition, rest and stress EKG showed sinus rhythm, anterosetpal nonspecific T wave inversion, frequent PVC's.  Decreased counts in inferior/inferolateral myocardium seen worse at rest, likely due to breast tissue attenuation with images performed in sitting position. Stress LVEF 48%.  Intermediate risk study. Recommend clinical correlation.    Heart Catheterization: None  LABORATORY DATA:    Latest Ref Rng & Units 12/16/2021    7:34 AM 11/06/2021    4:01 PM 02/02/2017    3:56 AM  CBC  WBC 4.0 - 10.5 K/uL  5.7  7.9   Hemoglobin 11.1 - 15.9 g/dL 14.9  13.1  11.8   Hematocrit 34.0 - 46.6 % 43.7  41.2  35.4   Platelets 150 - 400 K/uL  105  123        Latest Ref Rng & Units 12/16/2021    7:33 AM 11/06/2021    4:01 PM 06/28/2021    9:09 AM  CMP  Glucose 70 - 99 mg/dL 112  164  113   BUN 8 - 27 mg/dL '10  16  17   '$ Creatinine 0.57 - 1.00 mg/dL 0.83  0.65  0.93   Sodium 134 - 144 mmol/L 145  141  142   Potassium 3.5 - 5.2 mmol/L 4.8  4.0  4.1   Chloride 96 - 106 mmol/L 106  112  104   CO2 20 - 29 mmol/L '23  21  25   '$ Calcium 8.7 - 10.3 mg/dL 9.6  8.6  8.9   Total Protein 6.0 - 8.5 g/dL 6.1  5.6    Total Bilirubin 0.0 - 1.2 mg/dL 0.9  0.5    Alkaline Phos 44 - 121 IU/L 61  51    AST 0 - 40 IU/L 20  20    ALT 0 - 32 IU/L 7  11      Lipid Panel     Component Value Date/Time    CHOL 159 12/16/2021 0735   TRIG 71 12/16/2021 0735   HDL 66 12/16/2021 0735   LDLCALC 79 12/16/2021 0735  LDLDIRECT 78 12/16/2021 0736   LABVLDL 14 12/16/2021 0735    Lab Results  Component Value Date   HGBA1C 5.8 (H) 01/23/2017   No components found for: "NTPROBNP" No results found for: "TSH"  Cardiac Panel (last 3 results) No results for input(s): "CKTOTAL", "CKMB", "TROPONINIHS", "RELINDX" in the last 72 hours.  IMPRESSION:    ICD-10-CM   1. Chronic combined systolic and diastolic heart failure (HCC)  I50.42 EKG 12-Lead    2. Mixed hyperlipidemia  E78.2     3. Essential hypertension  I10     4. Hx pulmonary embolism  Z86.711     5. Hx of deep venous thrombosis  Z86.718     6. Class 1 obesity due to excess calories without serious comorbidity with body mass index (BMI) of 30.0 to 30.9 in adult  E66.09    Z68.30        RECOMMENDATIONS: Debbie Richards is a 89 y.o. female whose past medical history and cardiovascular risk factors include: Chronic combined systolic and diastolic heart failure, stage B, NYHA class II/III, hypertension, hypothyroidism, prediabetes, diabetic neuropathy, familial Mediterranean fever, history of DVT, history of PE, postmenopausal female, advanced age.   Chronic combined systolic and diastolic heart failure (HCC) Stage B, NYHA class II  Weight has gone up by 10 pounds since last visit. Check NT proBNP, BMP, magnesium level. Echo will be ordered to evaluate for structural heart disease and left ventricular systolic function.  Mixed hyperlipidemia In the past, has been intolerant to Lipitor, Crestor, Zocor.   Continue Livalo.  Independently reviewed labs from 12/16/2021, LDL within acceptable limits.  Essential hypertension Office blood pressures within acceptable limits. Medications reconciled.  Hx pulmonary embolism / Hx of deep venous thrombosis Currently on oral anticoagulation managed by PCP.  Class 1 obesity due to excess  calories without serious comorbidity with body mass index (BMI) of 30.0 to 30.9 in adult Body mass index is 32 kg/m. I reviewed with the patient the importance of diet, regular physical activity/exercise, weight loss.   Patient is educated on increasing physical activity gradually as tolerated.  With the goal of moderate intensity exercise for 30 minutes a day 5 days a week.  FINAL MEDICATION LIST END OF ENCOUNTER: No orders of the defined types were placed in this encounter.   Current Outpatient Medications:  .  acetaminophen (TYLENOL) 650 MG CR tablet, Take 1,300 mg by mouth every 8 (eight) hours as needed for pain., Disp: , Rfl:  .  colchicine 0.6 MG tablet, Take 0.6 mg by mouth daily., Disp: , Rfl:  .  gabapentin (NEURONTIN) 300 MG capsule, Take 300 mg by mouth in the morning and at bedtime. , Disp: , Rfl:  .  levothyroxine (SYNTHROID) 50 MCG tablet, Take 1 tablet by mouth daily., Disp: , Rfl:  .  metoprolol succinate (TOPROL XL) 25 MG 24 hr tablet, Take 1 tablet (25 mg total) by mouth daily., Disp: 90 tablet, Rfl: 2 .  Multiple Vitamins-Minerals (CENTRUM SILVER PO), Take 1 tablet by mouth daily., Disp: , Rfl:  .  ondansetron (ZOFRAN-ODT) 4 MG disintegrating tablet, Take 1 tablet (4 mg total) by mouth every 8 (eight) hours as needed for nausea or vomiting., Disp: 20 tablet, Rfl: 0 .  Pitavastatin Calcium (LIVALO) 2 MG TABS, Take 1 tablet (2 mg total) by mouth daily., Disp: 90 tablet, Rfl: 3 .  Rivaroxaban (XARELTO) 15 MG TABS tablet, Take 15 mg by mouth daily with supper., Disp: , Rfl:  .  sacubitril-valsartan (  ENTRESTO) 49-51 MG, Take 1 tablet by mouth 2 (two) times daily., Disp: 180 tablet, Rfl: 3  Orders Placed This Encounter  Procedures  . EKG 12-Lead    --Continue cardiac medications as reconciled in final medication list. --No follow-ups on file. Or sooner if needed. --Continue follow-up with your primary care physician regarding the management of your other chronic comorbid  conditions.  Patient's questions and concerns were addressed to her satisfaction. She voices understanding of the instructions provided during this encounter.   This note was created using a voice recognition software as a result there may be grammatical errors inadvertently enclosed that do not reflect the nature of this encounter. Every attempt is made to correct such errors.  Rex Kras, [place], [facility]  Pager: [phone] Office: [phone]

## 2022-07-05 ENCOUNTER — Encounter: Payer: Self-pay | Admitting: Cardiology

## 2022-07-05 LAB — MAGNESIUM: Magnesium: 2.3 mg/dL (ref 1.6–2.3)

## 2022-07-05 LAB — BASIC METABOLIC PANEL
BUN/Creatinine Ratio: 18 (ref 12–28)
BUN: 17 mg/dL (ref 8–27)
CO2: 23 mmol/L (ref 20–29)
Calcium: 9.7 mg/dL (ref 8.7–10.3)
Chloride: 103 mmol/L (ref 96–106)
Creatinine, Ser: 0.95 mg/dL (ref 0.57–1.00)
Glucose: 105 mg/dL — ABNORMAL HIGH (ref 70–99)
Potassium: 4.7 mmol/L (ref 3.5–5.2)
Sodium: 141 mmol/L (ref 134–144)
eGFR: 57 mL/min/{1.73_m2} — ABNORMAL LOW (ref >59)

## 2022-07-08 LAB — SPECIMEN STATUS REPORT

## 2022-07-08 LAB — PRO B NATRIURETIC PEPTIDE: NT-Pro BNP: 194 pg/mL (ref 0–738)

## 2022-07-09 ENCOUNTER — Ambulatory Visit: Payer: Medicare HMO

## 2022-07-09 DIAGNOSIS — I5042 Chronic combined systolic (congestive) and diastolic (congestive) heart failure: Secondary | ICD-10-CM

## 2022-07-15 DIAGNOSIS — I5022 Chronic systolic (congestive) heart failure: Secondary | ICD-10-CM | POA: Diagnosis not present

## 2022-07-21 ENCOUNTER — Other Ambulatory Visit: Payer: Self-pay

## 2022-07-21 DIAGNOSIS — R0602 Shortness of breath: Secondary | ICD-10-CM

## 2022-07-21 NOTE — Progress Notes (Signed)
Patient has been on Entresto 49-70m 1/2 tablet BID for over a week and labs are follow up

## 2022-07-22 DIAGNOSIS — R0602 Shortness of breath: Secondary | ICD-10-CM | POA: Diagnosis not present

## 2022-07-22 DIAGNOSIS — I10 Essential (primary) hypertension: Secondary | ICD-10-CM | POA: Diagnosis not present

## 2022-07-23 ENCOUNTER — Encounter: Payer: Self-pay | Admitting: Cardiology

## 2022-07-23 LAB — BASIC METABOLIC PANEL
BUN/Creatinine Ratio: 19 (ref 12–28)
BUN: 20 mg/dL (ref 8–27)
CO2: 22 mmol/L (ref 20–29)
Calcium: 9.7 mg/dL (ref 8.7–10.3)
Chloride: 102 mmol/L (ref 96–106)
Creatinine, Ser: 1.03 mg/dL — ABNORMAL HIGH (ref 0.57–1.00)
Glucose: 111 mg/dL — ABNORMAL HIGH (ref 70–99)
Potassium: 4.3 mmol/L (ref 3.5–5.2)
Sodium: 141 mmol/L (ref 134–144)
eGFR: 52 mL/min/{1.73_m2} — ABNORMAL LOW (ref >59)

## 2022-07-23 LAB — PRO B NATRIURETIC PEPTIDE: NT-Pro BNP: 400 pg/mL (ref 0–738)

## 2022-07-23 LAB — MAGNESIUM: Magnesium: 2.2 mg/dL (ref 1.6–2.3)

## 2022-07-23 NOTE — Telephone Encounter (Signed)
From patient daughter.

## 2022-07-23 NOTE — Telephone Encounter (Signed)
From pt daughter

## 2022-07-24 ENCOUNTER — Other Ambulatory Visit: Payer: Self-pay

## 2022-07-24 ENCOUNTER — Other Ambulatory Visit: Payer: Self-pay | Admitting: Cardiology

## 2022-07-24 DIAGNOSIS — I5042 Chronic combined systolic (congestive) and diastolic (congestive) heart failure: Secondary | ICD-10-CM

## 2022-07-24 MED ORDER — METOPROLOL SUCCINATE ER 25 MG PO TB24: 25.0000 mg | ORAL_TABLET | Freq: Every day | ORAL | 0 refills | Status: DC

## 2022-07-24 MED ORDER — METOPROLOL SUCCINATE ER 25 MG PO TB24: 25.0000 mg | ORAL_TABLET | Freq: Every day | ORAL | 2 refills | Status: DC

## 2022-07-25 NOTE — Progress Notes (Signed)
LMTCB

## 2022-07-28 ENCOUNTER — Other Ambulatory Visit: Payer: Self-pay

## 2022-07-28 DIAGNOSIS — R0602 Shortness of breath: Secondary | ICD-10-CM

## 2022-07-28 MED ORDER — BUMETANIDE 1 MG PO TABS: 1.0000 mg | ORAL_TABLET | Freq: Every day | ORAL | 2 refills | Status: DC

## 2022-07-29 NOTE — Progress Notes (Signed)
Gave patient results, acknowledged understanding and had no further questions.

## 2022-08-03 ENCOUNTER — Other Ambulatory Visit: Payer: Self-pay | Admitting: Cardiology

## 2022-08-03 DIAGNOSIS — R0602 Shortness of breath: Secondary | ICD-10-CM

## 2022-08-07 ENCOUNTER — Other Ambulatory Visit: Payer: Self-pay

## 2022-08-07 DIAGNOSIS — E039 Hypothyroidism, unspecified: Secondary | ICD-10-CM | POA: Diagnosis not present

## 2022-08-07 DIAGNOSIS — I5042 Chronic combined systolic (congestive) and diastolic (congestive) heart failure: Secondary | ICD-10-CM | POA: Diagnosis not present

## 2022-08-07 DIAGNOSIS — I10 Essential (primary) hypertension: Secondary | ICD-10-CM | POA: Diagnosis not present

## 2022-08-07 DIAGNOSIS — E7800 Pure hypercholesterolemia, unspecified: Secondary | ICD-10-CM | POA: Diagnosis not present

## 2022-08-07 DIAGNOSIS — R0609 Other forms of dyspnea: Secondary | ICD-10-CM

## 2022-08-07 DIAGNOSIS — K219 Gastro-esophageal reflux disease without esophagitis: Secondary | ICD-10-CM | POA: Diagnosis not present

## 2022-08-07 DIAGNOSIS — I4891 Unspecified atrial fibrillation: Secondary | ICD-10-CM | POA: Diagnosis not present

## 2022-08-07 MED ORDER — ENTRESTO 49-51 MG PO TABS: 1.0000 | ORAL_TABLET | Freq: Two times a day (BID) | ORAL | 3 refills | Status: DC

## 2022-08-08 ENCOUNTER — Other Ambulatory Visit: Payer: Self-pay

## 2022-08-08 DIAGNOSIS — I5042 Chronic combined systolic (congestive) and diastolic (congestive) heart failure: Secondary | ICD-10-CM

## 2022-08-08 DIAGNOSIS — R0609 Other forms of dyspnea: Secondary | ICD-10-CM

## 2022-08-08 MED ORDER — ENTRESTO 49-51 MG PO TABS: 1.0000 | ORAL_TABLET | Freq: Two times a day (BID) | ORAL | 3 refills | Status: DC

## 2022-08-12 DIAGNOSIS — R0602 Shortness of breath: Secondary | ICD-10-CM | POA: Diagnosis not present

## 2022-08-12 DIAGNOSIS — I10 Essential (primary) hypertension: Secondary | ICD-10-CM | POA: Diagnosis not present

## 2022-08-13 LAB — BASIC METABOLIC PANEL
BUN/Creatinine Ratio: 27 (ref 12–28)
BUN: 25 mg/dL (ref 8–27)
CO2: 24 mmol/L (ref 20–29)
Calcium: 9.4 mg/dL (ref 8.7–10.3)
Chloride: 107 mmol/L — ABNORMAL HIGH (ref 96–106)
Creatinine, Ser: 0.92 mg/dL (ref 0.57–1.00)
Glucose: 108 mg/dL — ABNORMAL HIGH (ref 70–99)
Potassium: 5 mmol/L (ref 3.5–5.2)
Sodium: 142 mmol/L (ref 134–144)
eGFR: 60 mL/min/{1.73_m2} (ref >59)

## 2022-08-13 LAB — MAGNESIUM: Magnesium: 2.4 mg/dL — ABNORMAL HIGH (ref 1.6–2.3)

## 2022-08-13 LAB — PRO B NATRIURETIC PEPTIDE: NT-Pro BNP: 480 pg/mL (ref 0–738)

## 2022-08-14 DIAGNOSIS — I5022 Chronic systolic (congestive) heart failure: Secondary | ICD-10-CM | POA: Diagnosis not present

## 2022-08-15 ENCOUNTER — Encounter: Payer: Self-pay | Admitting: Cardiology

## 2022-08-15 ENCOUNTER — Telehealth: Payer: Self-pay

## 2022-08-15 ENCOUNTER — Ambulatory Visit: Payer: Medicare HMO | Admitting: Cardiology

## 2022-08-15 VITALS — BP 122/75 | HR 83 | Ht 70.0 in | Wt 212.0 lb

## 2022-08-15 DIAGNOSIS — I5032 Chronic diastolic (congestive) heart failure: Secondary | ICD-10-CM

## 2022-08-15 DIAGNOSIS — E782 Mixed hyperlipidemia: Secondary | ICD-10-CM

## 2022-08-15 DIAGNOSIS — R0602 Shortness of breath: Secondary | ICD-10-CM

## 2022-08-15 DIAGNOSIS — I10 Essential (primary) hypertension: Secondary | ICD-10-CM | POA: Diagnosis not present

## 2022-08-15 DIAGNOSIS — Z86718 Personal history of other venous thrombosis and embolism: Secondary | ICD-10-CM

## 2022-08-15 DIAGNOSIS — I5042 Chronic combined systolic (congestive) and diastolic (congestive) heart failure: Secondary | ICD-10-CM | POA: Diagnosis not present

## 2022-08-15 DIAGNOSIS — E6609 Other obesity due to excess calories: Secondary | ICD-10-CM | POA: Diagnosis not present

## 2022-08-15 DIAGNOSIS — Z86711 Personal history of pulmonary embolism: Secondary | ICD-10-CM | POA: Diagnosis not present

## 2022-08-15 DIAGNOSIS — Z6830 Body mass index (BMI) 30.0-30.9, adult: Secondary | ICD-10-CM | POA: Diagnosis not present

## 2022-08-15 MED ORDER — BUMETANIDE 1 MG PO TABS: 1.0000 mg | ORAL_TABLET | Freq: Every day | ORAL | 0 refills | Status: DC

## 2022-08-15 MED ORDER — ENTRESTO 24-26 MG PO TABS: 1.0000 | ORAL_TABLET | Freq: Two times a day (BID) | ORAL | 0 refills | Status: AC

## 2022-08-15 NOTE — Progress Notes (Addendum)
Debbie Richards Date of Birth: 1931/04/12 MRN: [id number] Primary Care Provider:Meyers, Annie Main, MD Former Cardiology Providers: Sinclair Grooms, MD, Sueanne Margarita, MD, Jeri Lager, APRN, FNP-C Primary Cardiologist: Rex Kras, DO, [facility] (established care 11/10/2019)  Date: 08/15/22 Last Office Visit: 07/04/2022  Chief Complaint  Patient presents with   Congestive Heart Failure    management   Follow-up    6 weeks    HPI  Debbie Richards is a 89 y.o.  female whose past medical history and cardiovascular risk factors include: Chronic HFpEF, stage B, NYHA class II, hypertension, hypothyroidism, prediabetes, diabetic neuropathy, familial Mediterranean fever, history of DVT, history of PE, postmenopausal female, advanced age.   Patient is accompanied by her daughter [name] at today's office visit.  Patient is being followed by the practice for chronic combined systolic and diastolic heart failure.  At last office visit she had gained 10 pounds and concerns for volume overload.  Repeat echocardiogram was performed which noted improvement/recovered LVEF.  She has lost approximately 11 pounds since last office visit with diuretics and medication titration.  She uses bilateral compression stockings and elevates her legs if and when possible.  Most recent labs independently reviewed during today's encounter noted below for further reference.  Based on remote patient monitoring and average blood pressure is 127/63 with a pulse of 74 bpm and home weights as per her scales is 211-213 pounds.  ALLERGIES: Allergies  Allergen Reactions   Dilaudid [Hydromorphone Hcl] Other (See Comments)    Flushing, hallucinations   Gabapentin Other (See Comments)    RLS with burning   Lipitor [Atorvastatin Calcium] Other (See Comments)    Muscle aches    Lovastatin Other (See Comments)    Muscle pain   Zetia [Ezetimibe] Other (See Comments)    Muscles aches   Zocor [Simvastatin] Other (See Comments)     Muscle Aches     MEDICATION LIST PRIOR TO VISIT: Current Outpatient Medications on File Prior to Visit  Medication Sig Dispense Refill   acetaminophen (TYLENOL) 650 MG CR tablet Take 1,300 mg by mouth every 8 (eight) hours as needed for pain.     colchicine 0.6 MG tablet Take 0.6 mg by mouth daily.     gabapentin (NEURONTIN) 300 MG capsule Take 300 mg by mouth in the morning and at bedtime.      levothyroxine (SYNTHROID) 50 MCG tablet Take 1 tablet by mouth daily.     metoprolol succinate (TOPROL XL) 25 MG 24 hr tablet Take 1 tablet (25 mg total) by mouth daily. 90 tablet 0   Multiple Vitamins-Minerals (CENTRUM SILVER PO) Take 1 tablet by mouth daily.     ondansetron (ZOFRAN-ODT) 4 MG disintegrating tablet Take 1 tablet (4 mg total) by mouth every 8 (eight) hours as needed for nausea or vomiting. 20 tablet 0   Pitavastatin Calcium (LIVALO) 2 MG TABS Take 1 tablet (2 mg total) by mouth daily. 90 tablet 3   Rivaroxaban (XARELTO) 15 MG TABS tablet Take 15 mg by mouth daily with supper.     No current facility-administered medications on file prior to visit.    PAST MEDICAL HISTORY: Past Medical History:  Diagnosis Date   Borderline diabetes    Chronic back pain    Chronic lower back pain    Clotting disorder ([place])    Deep vein thrombosis (DVT) ([place])    DVT, lower extremity ([place]) 01/2002; 06/2003   RLE; LLE   Enlarged thyroid gland  Familial Mediterranean fever (HCC)    Fibromyalgia    Fibromyalgia    Gallstones    GERD (gastroesophageal reflux disease)    GERD (gastroesophageal reflux disease)    H/O hiatal hernia    History of stomach ulcers    Hyperlipidemia    Hypertension    Hypothyroidism    Insomnia    Migraine    "I've had 4; last time was when I was in my ?30's"   Osteoarthritis    Phlebitis    LLE   Pneumonia    Pulmonary embolism ([place]) 2003   Shortness of breath on exertion    Tinnitus     PAST SURGICAL HISTORY: Past Surgical History:  Procedure  Laterality Date   ABDOMINAL HYSTERECTOMY  1972   APPENDECTOMY  1960   bladder tack     BREAST BIOPSY     left   CARPAL TUNNEL RELEASE  1989   right   CATARACT EXTRACTION, BILATERAL  ~ 2011   CHOLECYSTECTOMY  09/12/2011   Procedure: LAPAROSCOPIC CHOLECYSTECTOMY WITH INTRAOPERATIVE CHOLANGIOGRAM;  Surgeon: Shann Medal, MD;  Location: [facility];  Service: General;  Laterality: N/A;   excision of goiter     EXTERNAL EAR SURGERY     x3   HERNIA REPAIR  1970's   ventral   KNEE ARTHROSCOPY Left 12/05/2019   Procedure: ARTHROSCOPY KNEE AND DEBRIDEMENT, partial medial and lateral meniscectomy, left femoral patella chondroplasty;  Surgeon: Dorna Leitz, MD;  Location: [facility];  Service: Orthopedics;  Laterality: Left;   MASS EXCISION  12/2001   paravertebral chest mass   MIDDLE EAR SURGERY  1959; 1982; after 1982   mini thoracotomy  12/2001   THORACOTOMY  2003   procedure done   to remove growth in lung    TOTAL KNEE ARTHROPLASTY Right 01/30/2017   Procedure: RIGHT TOTAL KNEE ARTHROPLASTY;  Surgeon: Dorna Leitz, MD;  Location: [facility];  Service: Orthopedics;  Laterality: Right;  With adductor canal block    FAMILY HISTORY: The patient's family history includes Dementia in her sister; Heart disease in her mother; Hyperlipidemia in her mother; Hypertension in her mother; Parkinson's disease in her brother, sister, and sister; Stroke in her father.   SOCIAL HISTORY:  The patient  reports that she has never smoked. She has never used smokeless tobacco. She reports that she does not drink alcohol and does not use drugs.  Review of Systems  Constitutional: Positive for weight loss. Negative for chills, fever and malaise/fatigue.  HENT:  Negative for hoarse voice and nosebleeds.   Eyes:  Negative for discharge, double vision and pain.  Cardiovascular:  Positive for dyspnea on exertion (improving) and leg swelling (improving). Negative for chest pain, claudication, near-syncope,  orthopnea, palpitations, paroxysmal nocturnal dyspnea and syncope.  Respiratory:  Negative for hemoptysis and shortness of breath.   Musculoskeletal:  Positive for joint pain and joint swelling. Negative for muscle cramps and myalgias.  Gastrointestinal:  Negative for abdominal pain, constipation, diarrhea, hematemesis, hematochezia, melena, nausea and vomiting.  Neurological:  Negative for dizziness and light-headedness.    PHYSICAL EXAM:    08/15/2022    1:21 PM 07/04/2022   10:43 AM 01/02/2022   11:37 AM  Vitals with BMI  Height '5\' 10"'$  '5\' 10"'$  '5\' 10"'$   Weight 212 lbs 223 lbs 213 lbs 6 oz  BMI Q000111Q 32 [id number]  Systolic 123XX123 123456 [id number]  Diastolic 75 57 54  Pulse 83 70 85    CONSTITUTIONAL: Age-appropriate, ambulates with a  cane, hemodynamically stable,  SKIN: Skin is warm and dry. No rash noted. No cyanosis. No pallor. No jaundice HEAD: Normocephalic and atraumatic.  EYES: No scleral icterus MOUTH/THROAT: Moist oral membranes.  NECK: No JVD present. No thyromegaly noted. No carotid bruits  CHEST Normal respiratory effort. No intercostal retractions  LUNGS: Clear to auscultation bilaterally.  No stridor. No wheezes. No rales.  CARDIOVASCULAR: Regular rate and rhythm, positive S1-S2, no murmurs rubs or gallops appreciated ABDOMINAL: .Obese, soft, nontender, nondistended, positive bowel sounds all 4 quadrants.No apparent ascites.  EXTREMITIES: Bilateral peripheral edema, warm to touch, tender to touch, skin changes suggestive of chronic venous stasis. HEMATOLOGIC: No significant bruising NEUROLOGIC: Oriented to person, place, and time. Nonfocal. Normal muscle tone.  PSYCHIATRIC: Normal mood and affect. Normal behavior. Cooperative  CARDIAC DATABASE: EKG: 08/15/2022: Normal sinus rhythm, 81 bpm, right bundle branch block..  Echocardiogram: 09/2011: LVEF 55-60% mildly dilated left atrium, PASP 33 mmHg.  11/14/2019: LVEF Q000111Q, grade 2 diastolic dysfunction, elevated LAP, severe LAE, mild MR,  mild TR, RVSP 21 mmHg.  01/07/2021: LVEF Q000111Q, grade 1 diastolic impairment, normal LAP, see report for additional details.  07/09/2022: Normal LV systolic function with visual EF 60-65%. Left ventricle cavity is normal in size. Normal left ventricular wall thickness. Normal global wall motion. Normal diastolic filling pattern, normal LAP.  Tricuspid trace regurgitation. No evidence of pulmonary hypertension. Mild pulmonic regurgitation. Compared to 01/07/2021 LVEF improved from 45-50% to 665%, G1DD is now normal otherwise no significant change.   Stress Testing:  Lexiscan/modified Bruce Tetrofosmin stress test 11/23/2019:  Lexiscan nuclear stress test performed using 1-day protocol. Stress EKG is non-diagnostic, as this is pharmacological stress test. In addition, rest and stress EKG showed sinus rhythm, anterosetpal nonspecific T wave inversion, frequent PVC's.  Decreased counts in inferior/inferolateral myocardium seen worse at rest, likely due to breast tissue attenuation with images performed in sitting position. Stress LVEF 48%.  Intermediate risk study. Recommend clinical correlation.    Heart Catheterization: None  REMOTE PATIENT MONITORING DATA Patient home blood pressure is well controlled on current antihypertensive regimen. Patient labs resulted 3/5. Patient denies any increased dyspnea and no increased edema from baseline; endorses still using her compression stockings daily. Patient reported weight is 211-213lb. (when previously talked to patient on 2/16 she said around 216lb, confirmed that patient weight has decreased overall with patient).   Systolic Blood Pressure      mmHg  --          127.9 (A999333 - 0000000) Diastolic Blood Pressure     mmHg  --          69.3 (42.0 - 96.0) Heart Rate      bpm     --          74.0 (45.0 - 100.0)   08/15/22 4:53 AM           ...         128      /           78        mmHg  78        bpm      08/14/22 5:18 PM           ...         103      /            61        mmHg  100      bpm  08/14/22 6:06 AM           ...         115      /           66        mmHg  84        bpm      08/14/22 5:48 AM           ...         108      /           61        mmHg  85        bpm      08/13/22 6:28 PM           ...         122      /           69        mmHg  85        bpm      08/13/22 4:59 PM           ...         101      /           61        mmHg  84        bpm      08/13/22 4:37 AM           ...         119      /           62        mmHg  75        bpm      08/12/22 5:20 PM           ...         121      /           65        mmHg  71        bpm      08/12/22 5:25 AM           ..Marland Kitchen         121      /           61        mmHg  74        bpm  LABORATORY DATA:    Latest Ref Rng & Units 12/16/2021    7:34 AM 11/06/2021    4:01 PM 02/02/2017    3:56 AM  CBC  WBC 4.0 - 10.5 K/uL  5.7  7.9   Hemoglobin 11.1 - 15.9 g/dL 14.9  13.1  11.8   Hematocrit 34.0 - 46.6 % 43.7  41.2  35.4   Platelets 150 - 400 K/uL  105  123        Latest Ref Rng & Units 08/12/2022    1:53 PM 07/22/2022    9:07 AM 07/04/2022   12:15 PM  CMP  Glucose 70 - 99 mg/dL 108  111  105   BUN 8 - 27 mg/dL '25  20  17   '$ Creatinine 0.57 - 1.00 mg/dL 0.92  1.03  0.95   Sodium 134 - 144 mmol/L 142  141  141   Potassium 3.5 - 5.2 mmol/L 5.0  4.3  4.7   Chloride 96 - 106 mmol/L 107  102  103   CO2 20 - 29 mmol/L '24  22  23   '$ Calcium 8.7 - 10.3 mg/dL 9.4  9.7  9.7     Lipid Panel     Component Value Date/Time   CHOL 159 12/16/2021 0735   TRIG 71 12/16/2021 0735   HDL 66 12/16/2021 0735   LDLCALC 79 12/16/2021 0735   LDLDIRECT 78 12/16/2021 0736   LABVLDL 14 12/16/2021 0735    Lab Results  Component Value Date   HGBA1C 5.8 (H) 01/23/2017   No components found for: "NTPROBNP" No results found for: "TSH"  Cardiac Panel (last 3 results) No results for input(s): "CKTOTAL", "CKMB", "TROPONINIHS", "RELINDX" in the last 72 hours.  IMPRESSION:    ICD-10-CM   1. Chronic combined systolic  and diastolic heart failure (HCC)  I50.42 EKG 12-Lead    sacubitril-valsartan (ENTRESTO) 24-26 MG    bumetanide (BUMEX) 1 MG tablet    2. Mixed hyperlipidemia  E78.2     3. Essential hypertension  I10     4. Hx pulmonary embolism  Z86.711     5. Hx of deep venous thrombosis  Z86.718     6. Class 1 obesity due to excess calories without serious comorbidity with body mass index (BMI) of 30.0 to 30.9 in adult  E66.09    Z68.30     7. Shortness of breath on exertion  R06.02        RECOMMENDATIONS: RAYANN JIAN is a 89 y.o. female whose past medical history and cardiovascular risk factors include: Chronic HFpEF, stage B, NYHA class II, hypertension, hypothyroidism, prediabetes, diabetic neuropathy, familial Mediterranean fever, history of DVT, history of PE, postmenopausal female, advanced age.   Chronic HFpEF Stage B, NYHA class II Recovered cardiomyopathy (01/2021 LVEF Q000111Q, grade 1 diastolic impairment and 123456 LVEF 60 to 123456, normal diastolic function).  Has lost 11 pounds since last office visit. Ambulatory blood pressure data reviewed blood pressures at home are currently well-controlled. She usually takes Entresto 49/51 mg half a tablet twice daily as long as SBP is greater than 120 mmHg.  Will discontinue Entresto 49/51 mg half a tablet twice daily. Prescribed a new prescription for Entresto 24/26 mg p.o. twice daily Currently on Bumex 1 mg p.o. daily Will target home weight between 211-213 pounds. Patient is asked to take half a tablet of Bumex extra if she gains more than 1 pound over 24 hours or 3 pounds in a week until she is back to her normal weight range.  Mixed hyperlipidemia In the past, has been intolerant to Lipitor, Crestor, Zocor.   Continue Livalo.  Independently reviewed labs from 12/16/2021, LDL within acceptable limits.  Essential hypertension Office and home blood pressures within acceptable limits. Medications reconciled and changes as noted  above  Hx pulmonary embolism / Hx of deep venous thrombosis Currently on oral anticoagulation managed by PCP.  Class 1 obesity due to excess calories without serious comorbidity with body mass index (BMI) of 30.0 to 30.9 in adult Body mass index is 30.42 kg/m. I reviewed with the patient the importance of diet, regular physical activity/exercise, weight loss.   Patient is educated on increasing physical activity gradually as tolerated.  With the goal of moderate intensity exercise for 30 minutes a day 5 days a week.  Discussed management of his 2 chronic comorbid conditions, independently reviewed most recent echocardiogram results, independently reviewed labs from 08/12/2022, RPM data reviewed independently,  medication changes as discussed above.  FINAL MEDICATION LIST END OF ENCOUNTER: Meds ordered this encounter  Medications   sacubitril-valsartan (ENTRESTO) 24-26 MG    Sig: Take 1 tablet by mouth 2 (two) times daily.    Dispense:  180 tablet    Refill:  0   bumetanide (BUMEX) 1 MG tablet    Sig: Take 1 tablet (1 mg total) by mouth daily.    Dispense:  90 tablet    Refill:  0    Current Outpatient Medications:    acetaminophen (TYLENOL) 650 MG CR tablet, Take 1,300 mg by mouth every 8 (eight) hours as needed for pain., Disp: , Rfl:    colchicine 0.6 MG tablet, Take 0.6 mg by mouth daily., Disp: , Rfl:    gabapentin (NEURONTIN) 300 MG capsule, Take 300 mg by mouth in the morning and at bedtime. , Disp: , Rfl:    levothyroxine (SYNTHROID) 50 MCG tablet, Take 1 tablet by mouth daily., Disp: , Rfl:    metoprolol succinate (TOPROL XL) 25 MG 24 hr tablet, Take 1 tablet (25 mg total) by mouth daily., Disp: 90 tablet, Rfl: 0   Multiple Vitamins-Minerals (CENTRUM SILVER PO), Take 1 tablet by mouth daily., Disp: , Rfl:    ondansetron (ZOFRAN-ODT) 4 MG disintegrating tablet, Take 1 tablet (4 mg total) by mouth every 8 (eight) hours as needed for nausea or vomiting., Disp: 20 tablet, Rfl: 0    Pitavastatin Calcium (LIVALO) 2 MG TABS, Take 1 tablet (2 mg total) by mouth daily., Disp: 90 tablet, Rfl: 3   Rivaroxaban (XARELTO) 15 MG TABS tablet, Take 15 mg by mouth daily with supper., Disp: , Rfl:    sacubitril-valsartan (ENTRESTO) 24-26 MG, Take 1 tablet by mouth 2 (two) times daily., Disp: 180 tablet, Rfl: 0   bumetanide (BUMEX) 1 MG tablet, Take 1 tablet (1 mg total) by mouth daily., Disp: 90 tablet, Rfl: 0  Orders Placed This Encounter  Procedures   EKG 12-Lead    --Continue cardiac medications as reconciled in final medication list. --Return in about 3 months (around 11/15/2022) for Follow up, heart failure management.. Or sooner if needed. --Continue follow-up with your primary care physician regarding the management of your other chronic comorbid conditions.  Patient's questions and concerns were addressed to her satisfaction. She voices understanding of the instructions provided during this encounter.   This note was created using a voice recognition software as a result there may be grammatical errors inadvertently enclosed that do not reflect the nature of this encounter. Every attempt is made to correct such errors.  Rex Kras, [place], [facility]  Pager:  [phone] Office: [phone]

## 2022-08-15 NOTE — Telephone Encounter (Signed)
Patient home blood pressure is well controlled on current antihypertensive regimen. Patient labs resulted 3/5. Patient denies any increased dyspnea and no increased edema from baseline; endorses still using her compression stockings daily. Patient reported weight is 211-213lb. (when previously talked to patient on 2/16 she said around 216lb, confirmed that patient weight has decreased overall with patient).  Systolic Blood Pressure mmHg -- 127.9 (A999333 - 0000000) Diastolic Blood Pressure mmHg -- 69.3 (42.0 - 96.0) Heart Rate bpm -- 74.0 (45.0 - 100.0)  08/15/22 4:53 AM ... 128 / 78 mmHg 78 bpm  08/14/22 5:18 PM ... 103 / 61 mmHg 100 bpm  08/14/22 6:06 AM ... 115 / 66 mmHg 84 bpm  08/14/22 5:48 AM ... 108 / 61 mmHg 85 bpm  08/13/22 6:28 PM ... 122 / 69 mmHg 85 bpm  08/13/22 4:59 PM ... 101 / 61 mmHg 84 bpm  08/13/22 4:37 AM ... 119 / 62 mmHg 75 bpm  08/12/22 5:20 PM ... 121 / 65 mmHg 71 bpm  08/12/22 5:25 AM ..Marland Kitchen 121 / 61 mmHg 74 bpm

## 2022-09-01 ENCOUNTER — Encounter: Payer: Self-pay | Admitting: Cardiology

## 2022-09-01 NOTE — Telephone Encounter (Signed)
From patient.

## 2022-09-01 NOTE — Telephone Encounter (Signed)
Lets review her RPM data given's daughter concerns.   Dr. Terri Skains

## 2022-09-02 NOTE — Telephone Encounter (Signed)
Thank you for following up. Can you get her Entresto 24/26mg  po bid tablets?  Dr. Terri Skains

## 2022-09-13 DIAGNOSIS — I5022 Chronic systolic (congestive) heart failure: Secondary | ICD-10-CM | POA: Diagnosis not present

## 2022-09-29 ENCOUNTER — Other Ambulatory Visit: Payer: Self-pay

## 2022-09-29 DIAGNOSIS — I5032 Chronic diastolic (congestive) heart failure: Secondary | ICD-10-CM

## 2022-09-29 MED ORDER — BUMETANIDE 1 MG PO TABS: 1.0000 mg | ORAL_TABLET | Freq: Every day | ORAL | 3 refills | Status: DC

## 2022-10-02 DIAGNOSIS — D6869 Other thrombophilia: Secondary | ICD-10-CM | POA: Diagnosis not present

## 2022-10-02 DIAGNOSIS — E042 Nontoxic multinodular goiter: Secondary | ICD-10-CM | POA: Diagnosis not present

## 2022-10-02 DIAGNOSIS — H9212 Otorrhea, left ear: Secondary | ICD-10-CM | POA: Diagnosis not present

## 2022-10-02 DIAGNOSIS — I5042 Chronic combined systolic (congestive) and diastolic (congestive) heart failure: Secondary | ICD-10-CM | POA: Diagnosis not present

## 2022-10-02 DIAGNOSIS — I110 Hypertensive heart disease with heart failure: Secondary | ICD-10-CM | POA: Diagnosis not present

## 2022-10-02 DIAGNOSIS — R7303 Prediabetes: Secondary | ICD-10-CM | POA: Diagnosis not present

## 2022-10-02 DIAGNOSIS — E7800 Pure hypercholesterolemia, unspecified: Secondary | ICD-10-CM | POA: Diagnosis not present

## 2022-10-02 DIAGNOSIS — I10 Essential (primary) hypertension: Secondary | ICD-10-CM | POA: Diagnosis not present

## 2022-10-02 DIAGNOSIS — I4891 Unspecified atrial fibrillation: Secondary | ICD-10-CM | POA: Diagnosis not present

## 2022-10-02 DIAGNOSIS — G629 Polyneuropathy, unspecified: Secondary | ICD-10-CM | POA: Diagnosis not present

## 2022-10-02 DIAGNOSIS — E850 Non-neuropathic heredofamilial amyloidosis: Secondary | ICD-10-CM | POA: Diagnosis not present

## 2022-10-02 NOTE — Progress Notes (Signed)
Averages Mar 2024 Systolic Blood Pressure 124.9 (83.0 - 156.0) Diastolic Blood Pressure 68.5 (16.1 - 96.0) Heart Rate  75.6 (40.0 - 104.0)  April 2024 Average BP 111/62 Average HR 75  Date Systolic Diastolic Units Heart Rate Units 06/09/22 5:33 AM 131 73 mmHg 69 bpm 06/09/22 5:32 PM 128 59 mmHg 74 bpm 06/10/22 5:32 AM 144 71 mmHg 68 bpm 06/10/22 5:08 PM 128 67 mmHg 75 bpm 06/11/22 5:19 AM 141 67 mmHg 62 bpm 06/11/22 5:11 PM 125 69 mmHg 88 bpm 06/12/22 5:17 AM 122 70 mmHg 66 bpm 06/12/22 5:13 PM 141 68 mmHg 72 bpm 06/13/22 5:25 AM 128 72 mmHg 65 bpm 06/13/22 5:18 PM 123 60 mmHg 85 bpm 06/14/22 5:21 AM 115 72 mmHg 64 bpm 06/14/22 5:19 PM 133 64 mmHg 86 bpm 06/15/22 5:55 AM 118 62 mmHg 115 bpm 06/15/22 6:50 AM 83 56 mmHg 100 bpm 06/15/22 5:19 PM 89 50 mmHg 79 bpm 06/16/22 3:28 AM 147 74 mmHg 71 bpm 06/16/22 4:11 PM 150 80 mmHg 73 bpm 06/17/22 4:57 AM 127 71 mmHg 61 bpm 06/17/22 5:15 PM 143 80 mmHg 79 bpm 06/18/22 5:08 AM 141 76 mmHg 70 bpm 06/18/22 5:12 PM 143 73 mmHg 71 bpm 06/19/22 5:14 AM 122 69 mmHg 62 bpm 06/19/22 5:16 PM 96 54 mmHg 74 bpm 06/20/22 5:13 AM 123 72 mmHg 64 bpm 06/20/22 5:17 PM 113 60 mmHg 82 bpm 06/21/22 5:20 AM 122 64 mmHg 68 bpm 06/21/22 5:26 PM 145 74 mmHg 76 bpm 06/22/22 5:17 AM 137 72 mmHg 59 bpm 06/22/22 5:20 PM 144 83 mmHg 74 bpm 06/23/22 5:40 AM 113 69 mmHg 61 bpm 06/23/22 5:20 PM 143 74 mmHg 70 bpm 06/24/22 5:14 AM 147 79 mmHg 62 bpm 06/24/22 5:21 PM 142 71 mmHg 76 bpm 06/25/22 5:23 AM 131 70 mmHg 60 bpm 06/25/22 5:23 PM 146 77 mmHg 74 bpm 06/26/22 5:56 AM 121 69 mmHg 62 bpm 06/26/22 5:18 PM 113 63 mmHg 73 bpm 06/27/22 5:21 AM 119 65 mmHg 61 bpm 06/27/22 5:23 PM 129 70 mmHg 75 bpm 06/28/22 5:35 AM 113 63 mmHg 58 bpm 06/28/22 5:20 PM 136 68 mmHg 77 bpm 06/29/22 5:17 AM 125 65 mmHg 59 bpm 06/29/22 5:19 PM 147 75 mmHg 79 bpm 06/30/22 5:15 AM 106 62 mmHg 60 bpm 06/30/22 5:21 PM 156 79 mmHg 76 bpm 07/01/22 5:34 AM 132 75 mmHg 65 bpm 07/01/22 5:22 PM 125 70 mmHg 69 bpm 07/02/22 5:10  AM 116 69 mmHg 50 bpm 07/02/22 5:09 PM 155 83 mmHg 73 bpm 07/03/22 5:29 AM 152 79 mmHg 72 bpm 07/03/22 5:28 PM 139 65 mmHg 72 bpm 07/04/22 5:13 AM 108 64 mmHg 67 bpm 07/04/22 5:11 PM 134 72 mmHg 76 bpm 07/05/22 5:12 AM 117 70 mmHg 63 bpm 07/05/22 5:11 PM 133 71 mmHg 74 bpm 07/06/22 5:18 AM 127 72 mmHg 71 bpm 07/06/22 5:19 PM 144 77 mmHg 68 bpm 07/07/22 5:19 AM 144 79 mmHg 71 bpm 07/07/22 5:19 PM 143 84 mmHg 79 bpm 07/08/22 5:20 AM 148 87 mmHg 78 bpm 07/08/22 5:17 PM 141 81 mmHg 69 bpm 07/09/22 5:47 AM 143 96 mmHg 72 bpm 07/09/22 4:26 PM 133 77 mmHg 91 bpm 07/10/22 5:08 AM 115 65 mmHg 66 bpm 07/10/22 5:04 PM 142 71 mmHg 80 bpm 07/11/22 5:09 AM 86 44 mmHg 69 bpm 07/11/22 5:23 AM 98 42 mmHg 65 bpm 07/11/22 4:49 PM 154 93 mmHg 81 bpm 07/12/22 5:09 AM 115 65 mmHg 78 bpm 07/12/22 5:10 PM 128 64 mmHg  75 bpm 07/13/22 5:09 AM 119 74 mmHg 71 bpm 07/13/22 5:48 AM 127 62 mmHg 76 bpm 07/13/22 5:44 PM 153 87 mmHg 79 bpm 07/14/22 5:07 AM 131 62 mmHg 71 bpm 07/14/22 5:14 PM 131 80 mmHg 73 bpm 07/15/22 5:31 AM 143 69 mmHg 72 bpm 07/15/22 5:05 PM 147 79 mmHg 85 bpm 07/16/22 5:29 AM 147 82 mmHg 75 bpm 07/16/22 5:09 PM 129 73 mmHg 73 bpm 07/17/22 4:47 AM 136 64 mmHg 69 bpm 07/17/22 5:09 PM 142 80 mmHg 78 bpm 07/18/22 5:19 AM 133 61 mmHg 84 bpm 07/18/22 5:13 PM 133 74 mmHg 88 bpm 07/19/22 7:06 AM 103 58 mmHg 84 bpm 07/19/22 5:17 PM 138 76 mmHg 86 bpm 07/20/22 5:13 AM 126 66 mmHg 79 bpm 07/20/22 5:29 PM 125 64 mmHg 80 bpm 07/21/22 5:25 AM 125 64 mmHg 74 bpm 07/21/22 5:16 PM 137 81 mmHg 45 bpm 07/22/22 5:21 AM 122 69 mmHg 94 bpm 07/22/22 5:20 PM 103 60 mmHg 74 bpm 07/23/22 6:12 AM 115 64 mmHg 72 bpm 07/23/22 5:06 PM 136 80 mmHg 76 bpm 07/24/22 5:10 AM 122 68 mmHg 74 bpm 07/24/22 5:16 PM 135 69 mmHg 72 bpm 07/25/22 5:11 AM 141 79 mmHg 64 bpm 07/25/22 5:12 PM 112 64 mmHg 76 bpm 07/26/22 5:19 AM 112 66 mmHg 63 bpm 07/26/22 5:28 AM 141 67 mmHg 70 bpm 07/26/22 5:09 PM 149 83 mmHg 73 bpm 07/27/22 5:36 AM 138 74 mmHg 73 bpm 07/27/22 5:51  PM 147 77 mmHg 72 bpm 07/28/22 5:33 AM 131 69 mmHg 68 bpm 07/28/22 5:31 PM 149 75 mmHg 80 bpm 07/29/22 6:03 AM 130 71 mmHg 74 bpm 07/29/22 5:18 PM 109 67 mmHg 78 bpm 07/30/22 5:35 AM 116 61 mmHg 75 bpm 07/30/22 5:47 AM 112 62 mmHg 79 bpm 07/30/22 5:17 PM 133 63 mmHg 76 bpm 07/31/22 5:49 AM 139 60 mmHg 74 bpm 07/31/22 5:12 PM 88 54 mmHg 82 bpm 08/01/22 5:30 AM 131 73 mmHg 80 bpm 08/01/22 5:12 PM 121 71 mmHg 76 bpm 08/02/22 5:36 AM 113 62 mmHg 80 bpm 08/02/22 4:56 PM 125 65 mmHg 82 bpm 08/03/22 5:08 AM 97 62 mmHg 73 bpm 08/03/22 5:22 PM 121 64 mmHg 83 bpm 08/04/22 5:17 AM 83 49 mmHg 73 bpm 08/04/22 5:38 AM 122 66 mmHg 81 bpm 08/04/22 5:28 PM 123 63 mmHg 77 bpm 08/05/22 5:26 AM 131 79 mmHg 87 bpm 08/05/22 5:13 PM 110 64 mmHg 79 bpm 08/06/22 5:14 AM 91 59 mmHg 74 bpm 08/06/22 5:36 AM 124 75 mmHg 80 bpm 08/06/22 5:18 PM 123 67 mmHg 77 bpm 08/07/22 5:58 AM 108 59 mmHg 74 bpm 08/07/22 6:23 AM 128 69 mmHg 75 bpm 08/07/22 5:16 PM 132 73 mmHg 78 bpm 08/08/22 5:31 AM 117 64 mmHg 72 bpm 08/08/22 5:59 AM 132 69 mmHg 84 bpm 08/08/22 5:17 PM 103 61 mmHg 77 bpm 08/09/22 5:25 AM 125 71 mmHg 76 bpm 08/09/22 5:45 PM 142 66 mmHg 80 bpm 08/10/22 4:41 AM 104 60 mmHg 80 bpm 08/10/22 5:18 PM 112 56 mmHg 78 bpm 08/10/22 5:32 PM 127 71 mmHg 88 bpm 08/11/22 4:55 AM 116 60 mmHg 76 bpm 08/11/22 5:14 PM 127 70 mmHg 79 bpm 08/12/22 5:25 AM 121 61 mmHg 74 bpm 08/12/22 5:20 PM 121 65 mmHg 71 bpm 08/13/22 4:37 AM 119 62 mmHg 75 bpm 08/13/22 4:59 PM 101 61 mmHg 84 bpm 08/13/22 6:28 PM 122 69 mmHg 85 bpm 08/14/22 5:48 AM 108 61 mmHg 85 bpm 08/14/22 6:06 AM 115 66  mmHg 84 bpm 08/14/22 5:18 PM 103 61 mmHg 100 bpm 08/15/22 4:53 AM 128 78 mmHg 78 bpm 08/15/22 4:58 PM 113 56 mmHg 91 bpm 08/16/22 5:52 AM 123 66 mmHg 76 bpm 08/16/22 5:14 PM 141 90 mmHg 82 bpm 08/17/22 3:16 AM 102 55 mmHg 73 bpm 08/17/22 4:54 AM 97 59 mmHg 93 bpm 08/17/22 5:40 PM 108 62 mmHg 88 bpm 08/18/22 6:24 AM 127 71 mmHg 87 bpm 08/18/22 5:09 PM 96 61 mmHg 77 bpm 08/19/22 6:03 AM 127 80 mmHg 89 bpm 08/19/22  5:14 PM 107 59 mmHg 75 bpm 08/19/22 5:44 PM 100 59 mmHg 75 bpm 08/20/22 4:05 AM 130 78 mmHg 87 bpm 08/20/22 5:05 PM 111 67 mmHg 82 bpm 08/21/22 6:59 AM 124 74 mmHg 85 bpm 08/21/22 5:18 PM 136 76 mmHg 81 bpm 08/22/22 5:16 AM 91 54 mmHg 85 bpm 08/22/22 5:33 AM 118 69 mmHg 82 bpm 08/22/22 5:06 PM 113 64 mmHg 104 bpm 08/23/22 6:15 AM 124 74 mmHg 91 bpm 08/23/22 5:08 PM 114 75 mmHg 87 bpm 08/24/22 4:56 AM 118 63 mmHg 73 bpm 08/24/22 5:11 AM 124 71 mmHg 82 bpm 08/24/22 5:06 PM 105 60 mmHg 83 bpm 08/25/22 5:52 AM 119 72 mmHg 77 bpm 08/25/22 5:10 PM 144 83 mmHg 85 bpm 08/26/22 4:48 AM 111 51 mmHg 40 bpm 08/26/22 5:20 AM 127 69 mmHg 60 bpm 08/26/22 5:10 PM 139 79 mmHg 81 bpm 08/27/22 7:03 AM 127 74 mmHg 81 bpm 08/27/22 5:08 PM 123 74 mmHg 77 bpm 08/28/22 4:55 AM 111 59 mmHg 69 bpm 08/28/22 5:06 AM 116 62 mmHg 73 bpm 08/28/22 5:09 PM 129 68 mmHg 83 bpm 08/29/22 6:03 AM 100 59 mmHg 71 bpm 08/29/22 4:58 PM 144 77 mmHg 81 bpm 08/30/22 6:00 AM 96 55 mmHg 73 bpm 08/30/22 5:17 PM 141 66 mmHg 78 bpm 08/31/22 5:42 AM 114 60 mmHg 74 bpm 08/31/22 5:29 PM 126 67 mmHg 79 bpm 09/01/22 6:44 AM 112 58 mmHg 72 bpm 09/01/22 5:18 PM 135 72 mmHg 75 bpm 09/02/22 6:57 AM 104 57 mmHg 82 bpm 09/02/22 5:03 PM 132 61 mmHg 78 bpm 09/03/22 6:07 AM 117 64 mmHg 40 bpm 09/03/22 5:09 PM 132 65 mmHg 72 bpm 09/04/22 5:27 AM 95 57 mmHg 63 bpm 09/04/22 5:10 PM 133 71 mmHg 72 bpm 09/05/22 5:03 AM 108 59 mmHg 70 bpm 09/05/22 5:00 PM 150 88 mmHg 86 bpm 09/06/22 6:09 AM 114 54 mmHg 82 bpm 09/06/22 5:15 PM 109 60 mmHg 82 bpm 09/07/22 5:39 AM 96 53 mmHg 76 bpm 09/07/22 5:02 PM 130 63 mmHg 77 bpm 09/08/22 5:01 AM 113 67 mmHg 68 bpm 09/08/22 5:14 PM 109 65 mmHg 77 bpm 09/09/22 5:35 AM 111 66 mmHg 73 bpm 09/09/22 5:34 PM 128 64 mmHg 76 bpm 09/10/22 5:00 AM 99 57 mmHg 60 bpm 09/10/22 5:07 AM 109 64 mmHg 66 bpm 09/10/22 5:06 PM 106 64 mmHg 62 bpm 09/11/22 5:49 AM 106 64 mmHg 68 bpm 09/11/22 5:14 PM 94 57 mmHg 86 bpm 09/11/22 6:54 PM 131 73 mmHg 82 bpm 09/12/22 5:48  AM 111 59 mmHg 70 bpm 09/12/22 5:09 PM 116 65 mmHg 78 bpm 09/13/22 5:14 AM 108 63 mmHg 77 bpm 09/13/22 5:21 PM 102 55 mmHg 91 bpm 09/14/22 5:33 AM 112 64 mmHg 69 bpm 09/14/22 5:10 PM 118 59 mmHg 84 bpm 09/15/22 5:04 AM 100 53 mmHg 67 bpm 09/15/22 5:21 PM 119 71 mmHg 80 bpm 09/16/22 7:49 AM 109 57 mmHg 88 bpm 09/16/22 5:23 PM  107 56 mmHg 80 bpm 09/17/22 5:54 AM 123 60 mmHg 69 bpm 09/17/22 5:26 PM 115 65 mmHg 78 bpm 09/18/22 4:56 AM 116 65 mmHg 67 bpm 09/18/22 5:08 PM 121 63 mmHg 79 bpm 09/19/22 5:01 AM 99 56 mmHg 64 bpm 09/19/22 5:10 AM 90 44 mmHg 67 bpm 09/19/22 5:25 PM 125 76 mmHg 78 bpm 09/20/22 5:50 AM 115 56 mmHg 70 bpm 09/20/22 5:01 PM 132 78 mmHg 73 bpm 09/21/22 5:06 AM 96 56 mmHg 69 bpm 09/21/22 6:21 AM 119 61 mmHg 69 bpm 09/22/22 5:15 AM 110 58 mmHg 71 bpm 09/22/22 5:13 PM 118 64 mmHg 76 bpm 09/23/22 7:16 AM 111 57 mmHg 69 bpm 09/23/22 5:04 PM 121 62 mmHg 77 bpm 09/24/22 5:57 AM 116 64 mmHg 77 bpm 09/24/22 5:24 PM 92 54 mmHg 79 bpm 09/25/22 6:02 AM 101 64 mmHg 76 bpm 09/25/22 5:21 PM 115 56 mmHg 80 bpm 09/26/22 5:02 AM 98 61 mmHg 59 bpm 09/26/22 6:22 AM 103 65 mmHg 73 bpm 09/26/22 3:19 PM 141 75 mmHg 80 bpm 09/27/22 5:07 AM 100 61 mmHg 71 bpm 09/27/22 5:11 PM 122 58 mmHg 70 bpm 09/28/22 5:09 AM 106 57 mmHg 63 bpm 09/28/22 5:07 PM 130 75 mmHg 89 bpm 09/29/22 6:02 AM 123 65 mmHg 80 bpm 09/29/22 5:00 PM 118 72 mmHg 90 bpm 09/30/22 5:08 AM 97 53 mmHg 65 bpm 09/30/22 5:17 AM 117 59 mmHg 70 bpm 09/30/22 5:17 PM 124 76 mmHg 89 bpm 10/01/22 5:01 AM 100 62 mmHg 65 bpm 10/01/22 4:57 PM 96 54 mmHg 103 bpm 10/01/22 5:41 PM 89 50 mmHg 78 bpm 10/02/22 5:31 AM 113 64 mmHg 71 bpm

## 2022-10-08 DIAGNOSIS — H7293 Unspecified perforation of tympanic membrane, bilateral: Secondary | ICD-10-CM | POA: Diagnosis not present

## 2022-10-08 DIAGNOSIS — H9212 Otorrhea, left ear: Secondary | ICD-10-CM | POA: Diagnosis not present

## 2022-10-13 DIAGNOSIS — I5022 Chronic systolic (congestive) heart failure: Secondary | ICD-10-CM | POA: Diagnosis not present

## 2022-10-14 DIAGNOSIS — Z85828 Personal history of other malignant neoplasm of skin: Secondary | ICD-10-CM | POA: Diagnosis not present

## 2022-10-14 DIAGNOSIS — L570 Actinic keratosis: Secondary | ICD-10-CM | POA: Diagnosis not present

## 2022-10-14 DIAGNOSIS — L821 Other seborrheic keratosis: Secondary | ICD-10-CM | POA: Diagnosis not present

## 2022-10-19 ENCOUNTER — Encounter: Payer: Self-pay | Admitting: Cardiology

## 2022-10-20 NOTE — Telephone Encounter (Signed)
From patient.

## 2022-11-12 DIAGNOSIS — I5022 Chronic systolic (congestive) heart failure: Secondary | ICD-10-CM | POA: Diagnosis not present

## 2022-11-13 DIAGNOSIS — H7293 Unspecified perforation of tympanic membrane, bilateral: Secondary | ICD-10-CM | POA: Diagnosis not present

## 2022-11-13 DIAGNOSIS — H9212 Otorrhea, left ear: Secondary | ICD-10-CM | POA: Diagnosis not present

## 2022-11-14 ENCOUNTER — Ambulatory Visit: Payer: Medicare HMO | Admitting: Cardiology

## 2022-11-25 ENCOUNTER — Ambulatory Visit: Payer: Medicare HMO | Admitting: Cardiology

## 2022-11-25 ENCOUNTER — Encounter: Payer: Self-pay | Admitting: Cardiology

## 2022-11-25 VITALS — BP 114/67 | HR 71 | Ht 70.0 in | Wt 201.8 lb

## 2022-11-25 DIAGNOSIS — I10 Essential (primary) hypertension: Secondary | ICD-10-CM | POA: Diagnosis not present

## 2022-11-25 DIAGNOSIS — E782 Mixed hyperlipidemia: Secondary | ICD-10-CM

## 2022-11-25 DIAGNOSIS — I5042 Chronic combined systolic (congestive) and diastolic (congestive) heart failure: Secondary | ICD-10-CM | POA: Diagnosis not present

## 2022-11-25 DIAGNOSIS — I5032 Chronic diastolic (congestive) heart failure: Secondary | ICD-10-CM

## 2022-11-25 MED ORDER — ENTRESTO 24-26 MG PO TABS: 1.0000 | ORAL_TABLET | Freq: Two times a day (BID) | ORAL | 0 refills | Status: AC

## 2022-11-25 NOTE — Progress Notes (Signed)
Debbie Richards Date of Birth: 26-Sep-1930 MRN: [id number] Primary Care Provider:Meyers, Jeannett Senior, MD Former Cardiology Providers: Lesleigh Noe, MD, Quintella Reichert, MD, Altamese Albertson, APRN, FNP-C Primary Cardiologist: Tessa Lerner, DO, [facility] (established care 11/10/2019)  Date: 11/25/22 Last Office Visit: 08/15/2022  Chief Complaint  Patient presents with   Chronic heart failure with preserved ejection fraction (HFp   Follow-up    HPI  Debbie Richards is a 89 y.o.  female whose past medical history and cardiovascular risk factors include: Chronic HFpEF, stage B, NYHA class II, hypertension, hypothyroidism, prediabetes, diabetic neuropathy, familial Mediterranean fever, history of DVT, history of PE, postmenopausal female, advanced age.   Patient is accompanied by her daughter Selena Batten at today's office visit.  Patient is being followed by the practice for chronic combined systolic and diastolic heart failure.  She presents today for 5-month follow-up visit.  Since last office visit, she is doing well from a cardiovascular standpoint.  She denies anginal chest pain or heart failure symptoms.  Her dyspnea remains stable since January 2024 with uptitration of medical therapy she has lost 22 pounds.  ALLERGIES: Allergies  Allergen Reactions   Dilaudid [Hydromorphone Hcl] Other (See Comments)    Flushing, hallucinations   Gabapentin Other (See Comments)    RLS with burning   Lipitor [Atorvastatin Calcium] Other (See Comments)    Muscle aches    Lovastatin Other (See Comments)    Muscle pain   Zetia [Ezetimibe] Other (See Comments)    Muscles aches   Zocor [Simvastatin] Other (See Comments)    Muscle Aches     MEDICATION LIST PRIOR TO VISIT: Current Outpatient Medications on File Prior to Visit  Medication Sig Dispense Refill   acetaminophen (TYLENOL) 650 MG CR tablet Take 1,300 mg by mouth every 8 (eight) hours as needed for pain.     bumetanide (BUMEX) 1 MG tablet Take 1  tablet (1 mg total) by mouth daily. 90 tablet 3   colchicine 0.6 MG tablet Take 0.6 mg by mouth daily.     gabapentin (NEURONTIN) 100 MG capsule Take 100 mg by mouth at bedtime.     levothyroxine (SYNTHROID) 50 MCG tablet Take 1 tablet by mouth daily.     metoprolol succinate (TOPROL XL) 25 MG 24 hr tablet Take 1 tablet (25 mg total) by mouth daily. 90 tablet 0   Multiple Vitamins-Minerals (CENTRUM SILVER PO) Take 1 tablet by mouth daily.     Pitavastatin Calcium (LIVALO) 2 MG TABS Take 1 tablet (2 mg total) by mouth daily. 90 tablet 3   Rivaroxaban (XARELTO) 15 MG TABS tablet Take 15 mg by mouth daily with supper.     No current facility-administered medications on file prior to visit.    PAST MEDICAL HISTORY: Past Medical History:  Diagnosis Date   Borderline diabetes    Chronic back pain    Chronic lower back pain    Clotting disorder (HCC)    Deep vein thrombosis (DVT) (HCC)    DVT, lower extremity (HCC) 01/2002; 06/2003   RLE; LLE   Enlarged thyroid gland    Familial Mediterranean fever (HCC)    Fibromyalgia    Fibromyalgia    Gallstones    GERD (gastroesophageal reflux disease)    GERD (gastroesophageal reflux disease)    H/O hiatal hernia    History of stomach ulcers    Hyperlipidemia    Hypertension    Hypothyroidism    Insomnia    Migraine    "  I've had 4; last time was when I was in my ?30's"   Osteoarthritis    Phlebitis    LLE   Pneumonia    Pulmonary embolism (HCC) 2003   Shortness of breath on exertion    Tinnitus     PAST SURGICAL HISTORY: Past Surgical History:  Procedure Laterality Date   ABDOMINAL HYSTERECTOMY  1972   APPENDECTOMY  1960   bladder tack     BREAST BIOPSY     left   CARPAL TUNNEL RELEASE  1989   right   CATARACT EXTRACTION, BILATERAL  ~ 2011   CHOLECYSTECTOMY  09/12/2011   Procedure: LAPAROSCOPIC CHOLECYSTECTOMY WITH INTRAOPERATIVE CHOLANGIOGRAM;  Surgeon: Kandis Cocking, MD;  Location: [facility] OR;  Service: General;  Laterality: N/A;    excision of goiter     EXTERNAL EAR SURGERY     x3   HERNIA REPAIR  1970's   ventral   KNEE ARTHROSCOPY Left 12/05/2019   Procedure: ARTHROSCOPY KNEE AND DEBRIDEMENT, partial medial and lateral meniscectomy, left femoral patella chondroplasty;  Surgeon: Jodi Geralds, MD;  Location:  SURGERY CENTER;  Service: Orthopedics;  Laterality: Left;   MASS EXCISION  12/2001   paravertebral chest mass   MIDDLE EAR SURGERY  1959; 1982; after 1982   mini thoracotomy  12/2001   THORACOTOMY  2003   procedure done   to remove growth in lung    TOTAL KNEE ARTHROPLASTY Right 01/30/2017   Procedure: RIGHT TOTAL KNEE ARTHROPLASTY;  Surgeon: Jodi Geralds, MD;  Location: [facility] ORS;  Service: Orthopedics;  Laterality: Right;  With adductor canal block    FAMILY HISTORY: The patient's family history includes Dementia in her sister; Heart disease in her mother; Hyperlipidemia in her mother; Hypertension in her mother; Parkinson's disease in her brother, sister, and sister; Stroke in her father.   SOCIAL HISTORY:  The patient  reports that she has never smoked. She has never used smokeless tobacco. She reports that she does not drink alcohol and does not use drugs.  Review of Systems  Constitutional: Positive for weight loss. Negative for chills, fever and malaise/fatigue.  HENT:  Negative for hoarse voice and nosebleeds.   Eyes:  Negative for discharge, double vision and pain.  Cardiovascular:  Positive for dyspnea on exertion (improving) and leg swelling (improving). Negative for chest pain, claudication, near-syncope, orthopnea, palpitations, paroxysmal nocturnal dyspnea and syncope.  Respiratory:  Negative for hemoptysis and shortness of breath.   Musculoskeletal:  Positive for joint pain and joint swelling. Negative for muscle cramps and myalgias.  Gastrointestinal:  Negative for abdominal pain, constipation, diarrhea, hematemesis, hematochezia, melena, nausea and vomiting.  Neurological:  Negative for  dizziness and light-headedness.    PHYSICAL EXAM:    11/25/2022    2:50 PM 08/15/2022    1:21 PM 07/04/2022   10:43 AM  Vitals with BMI  Height 5\' 10"  5\' 10"  5\' 10"   Weight 201 lbs 13 oz 212 lbs 223 lbs  BMI 28.96 30.42 32  Systolic 114 122 161  Diastolic 67 75 57  Pulse 71 83 70    Physical Exam  Constitutional: No distress.  Age appropriate, hemodynamically stable, ambulates with a cane.  Neck: No JVD present.  Cardiovascular: Normal rate, regular rhythm, S1 normal, S2 normal, intact distal pulses and normal pulses. Exam reveals no gallop, no S3 and no S4.  No murmur heard. Pulmonary/Chest: Effort normal and breath sounds normal. No stridor. She has no wheezes. She has no rales.  Abdominal: Soft. Bowel  sounds are normal. She exhibits no distension. There is no abdominal tenderness.  Musculoskeletal:        General: Edema (Trace bilateral) present.     Cervical back: Neck supple.     Comments: Tender to touch bilateral lower extremity, compression stockings present.  Neurological: She is alert and oriented to person, place, and time. She has intact cranial nerves (2-12).  Skin: Skin is warm and moist.     CARDIAC DATABASE: EKG: 08/15/2022: Normal sinus rhythm, 81 bpm, right bundle branch block..  Echocardiogram: 09/2011: LVEF 55-60% mildly dilated left atrium, PASP 33 mmHg.  11/14/2019: LVEF 45-50%, grade 2 diastolic dysfunction, elevated LAP, severe LAE, mild MR, mild TR, RVSP 21 mmHg.  01/07/2021: LVEF 45-50%, grade 1 diastolic impairment, normal LAP, see report for additional details.  07/09/2022: Normal LV systolic function with visual EF 60-65%. Left ventricle cavity is normal in size. Normal left ventricular wall thickness. Normal global wall motion. Normal diastolic filling pattern, normal LAP.  Tricuspid trace regurgitation. No evidence of pulmonary hypertension. Mild pulmonic regurgitation. Compared to 01/07/2021 LVEF improved from 45-50% to 665%, G1DD is now normal  otherwise no significant change.   Stress Testing:  Lexiscan/modified Bruce Tetrofosmin stress test 11/23/2019:  Lexiscan nuclear stress test performed using 1-day protocol. Stress EKG is non-diagnostic, as this is pharmacological stress test. In addition, rest and stress EKG showed sinus rhythm, anterosetpal nonspecific T wave inversion, frequent PVC's.  Decreased counts in inferior/inferolateral myocardium seen worse at rest, likely due to breast tissue attenuation with images performed in sitting position. Stress LVEF 48%.  Intermediate risk study. Recommend clinical correlation.    Heart Catheterization: None  LABORATORY DATA:    Latest Ref Rng & Units 12/16/2021    7:34 AM 11/06/2021    4:01 PM 02/02/2017    3:56 AM  CBC  WBC 4.0 - 10.5 K/uL  5.7  7.9   Hemoglobin 11.1 - 15.9 g/dL 72.5  36.6  44.0   Hematocrit 34.0 - 46.6 % 43.7  41.2  35.4   Platelets 150 - 400 K/uL  105  123        Latest Ref Rng & Units 08/12/2022    1:53 PM 07/22/2022    9:07 AM 07/04/2022   12:15 PM  CMP  Glucose 70 - 99 mg/dL 347  425  956   BUN 8 - 27 mg/dL 25  20  17    Creatinine 0.57 - 1.00 mg/dL 3.87  5.64  3.32   Sodium 134 - 144 mmol/L 142  141  141   Potassium 3.5 - 5.2 mmol/L 5.0  4.3  4.7   Chloride 96 - 106 mmol/L 107  102  103   CO2 20 - 29 mmol/L 24  22  23    Calcium 8.7 - 10.3 mg/dL 9.4  9.7  9.7     Lipid Panel     Component Value Date/Time   CHOL 159 12/16/2021 0735   TRIG 71 12/16/2021 0735   HDL 66 12/16/2021 0735   LDLCALC 79 12/16/2021 0735   LDLDIRECT 78 12/16/2021 0736   LABVLDL 14 12/16/2021 0735    Lab Results  Component Value Date   HGBA1C 5.8 (H) 01/23/2017   No components found for: "NTPROBNP" No results found for: "TSH"  Cardiac Panel (last 3 results) No results for input(s): "CKTOTAL", "CKMB", "TROPONINIHS", "RELINDX" in the last 72 hours.  IMPRESSION:    ICD-10-CM   1. Chronic heart failure with preserved ejection fraction (HFpEF) (HCC)  I50.32  sacubitril-valsartan (ENTRESTO) 24-26 MG    Basic metabolic panel    Magnesium    Pro b natriuretic peptide (BNP)    2. Essential hypertension  I10     3. Mixed hyperlipidemia  E78.2        RECOMMENDATIONS: MIRAJANE SEEPERSAD is a 89 y.o. female whose past medical history and cardiovascular risk factors include: Chronic HFpEF, stage B, NYHA class II, hypertension, hypothyroidism, prediabetes, diabetic neuropathy, familial Mediterranean fever, history of DVT, history of PE, postmenopausal female, advanced age.   Chronic HFpEF Stage B, NYHA class II Recovered cardiomyopathy (01/2021 LVEF 45-50%, grade 1 diastolic impairment and 06/2022 LVEF 60 to 65%, normal diastolic function).  Has lost 22 pounds since January 2024. Discontinue Entresto 49/51 mg half a tablet twice a day. New prescription sent to cover my meds Entresto 24/26 mg p.o. twice daily with holding parameters. Check BMP NT proBNP. Patient is encouraged to get a blood pressure cuff from [facility] or local grocery store to monitor her BP at home  Mixed hyperlipidemia In the past, has been intolerant to Lipitor, Crestor, Zocor.   Continue Livalo.  Currently managed by primary care provider.  Essential hypertension Home and office blood pressures are well-controlled. No changes warranted at this time.  Hx pulmonary embolism / Hx of deep venous thrombosis Currently on oral anticoagulation managed by PCP.   FINAL MEDICATION LIST END OF ENCOUNTER: Meds ordered this encounter  Medications   sacubitril-valsartan (ENTRESTO) 24-26 MG    Sig: Take 1 tablet by mouth 2 (two) times daily. Hold if systolic blood pressure (top number) less than 100 mmHg.    Dispense:  180 tablet    Refill:  0    Current Outpatient Medications:    acetaminophen (TYLENOL) 650 MG CR tablet, Take 1,300 mg by mouth every 8 (eight) hours as needed for pain., Disp: , Rfl:    bumetanide (BUMEX) 1 MG tablet, Take 1 tablet (1 mg total) by mouth daily., Disp: 90  tablet, Rfl: 3   colchicine 0.6 MG tablet, Take 0.6 mg by mouth daily., Disp: , Rfl:    gabapentin (NEURONTIN) 100 MG capsule, Take 100 mg by mouth at bedtime., Disp: , Rfl:    levothyroxine (SYNTHROID) 50 MCG tablet, Take 1 tablet by mouth daily., Disp: , Rfl:    metoprolol succinate (TOPROL XL) 25 MG 24 hr tablet, Take 1 tablet (25 mg total) by mouth daily., Disp: 90 tablet, Rfl: 0   Multiple Vitamins-Minerals (CENTRUM SILVER PO), Take 1 tablet by mouth daily., Disp: , Rfl:    Pitavastatin Calcium (LIVALO) 2 MG TABS, Take 1 tablet (2 mg total) by mouth daily., Disp: 90 tablet, Rfl: 3   Rivaroxaban (XARELTO) 15 MG TABS tablet, Take 15 mg by mouth daily with supper., Disp: , Rfl:    sacubitril-valsartan (ENTRESTO) 24-26 MG, Take 1 tablet by mouth 2 (two) times daily. Hold if systolic blood pressure (top number) less than 100 mmHg., Disp: 180 tablet, Rfl: 0  Orders Placed This Encounter  Procedures   Basic metabolic panel   Magnesium   Pro b natriuretic peptide (BNP)    --Continue cardiac medications as reconciled in final medication list. --Return in about 3 months (around 02/25/2023) for Follow up, heart failure management.. Or sooner if needed. --Continue follow-up with your primary care physician regarding the management of your other chronic comorbid conditions.  Patient's questions and concerns were addressed to her satisfaction. She voices understanding of the instructions provided during this encounter.   This note  was created using a voice recognition software as a result there may be grammatical errors inadvertently enclosed that do not reflect the nature of this encounter. Every attempt is made to correct such errors.  Tessa Lerner, [place], [facility]  Pager:  [phone] Office: [phone]

## 2022-11-26 LAB — BASIC METABOLIC PANEL
BUN/Creatinine Ratio: 21 (ref 12–28)
BUN: 18 mg/dL (ref 10–36)
CO2: 25 mmol/L (ref 20–29)
Calcium: 9.3 mg/dL (ref 8.7–10.3)
Chloride: 102 mmol/L (ref 96–106)
Creatinine, Ser: 0.86 mg/dL (ref 0.57–1.00)
Glucose: 93 mg/dL (ref 70–99)
Potassium: 4.5 mmol/L (ref 3.5–5.2)
Sodium: 142 mmol/L (ref 134–144)
eGFR: 64 mL/min/{1.73_m2} (ref >59)

## 2022-12-19 DIAGNOSIS — H903 Sensorineural hearing loss, bilateral: Secondary | ICD-10-CM | POA: Diagnosis not present

## 2022-12-19 DIAGNOSIS — H7293 Unspecified perforation of tympanic membrane, bilateral: Secondary | ICD-10-CM | POA: Diagnosis not present

## 2023-02-19 DIAGNOSIS — L570 Actinic keratosis: Secondary | ICD-10-CM | POA: Diagnosis not present

## 2023-02-19 DIAGNOSIS — L821 Other seborrheic keratosis: Secondary | ICD-10-CM | POA: Diagnosis not present

## 2023-02-24 ENCOUNTER — Encounter: Payer: Self-pay | Admitting: Cardiology

## 2023-02-24 ENCOUNTER — Ambulatory Visit: Payer: Medicare HMO | Admitting: Cardiology

## 2023-02-24 VITALS — BP 134/79 | HR 74 | Ht 70.0 in | Wt 199.0 lb

## 2023-02-24 DIAGNOSIS — Z86711 Personal history of pulmonary embolism: Secondary | ICD-10-CM | POA: Diagnosis not present

## 2023-02-24 DIAGNOSIS — E782 Mixed hyperlipidemia: Secondary | ICD-10-CM

## 2023-02-24 DIAGNOSIS — I10 Essential (primary) hypertension: Secondary | ICD-10-CM

## 2023-02-24 DIAGNOSIS — Z86718 Personal history of other venous thrombosis and embolism: Secondary | ICD-10-CM | POA: Diagnosis not present

## 2023-02-24 DIAGNOSIS — I5032 Chronic diastolic (congestive) heart failure: Secondary | ICD-10-CM | POA: Diagnosis not present

## 2023-02-24 NOTE — Progress Notes (Signed)
Debbie Richards Date of Birth: 07-31-1930 MRN: [id number] Primary Care Provider:Meyers, Jeannett Senior, MD Former Cardiology Providers: Lesleigh Noe, MD, Quintella Reichert, MD, Altamese Megargel, APRN, FNP-C Primary Cardiologist: Tessa Lerner, DO, [facility] (established care 11/10/2019)  Date: 02/24/23 Last Office Visit: 11/25/2022  Chief Complaint  Patient presents with   Congestive Heart Failure   Follow-up    HPI  Debbie Richards is a 89 y.o.  female whose past medical history and cardiovascular risk factors include: Chronic HFpEF, stage B, NYHA class II, hypertension, hypothyroidism, prediabetes, diabetic neuropathy, familial Mediterranean fever, history of DVT, history of PE, postmenopausal female, advanced age.   Patient is being followed in the practice given her history of combined systolic and diastolic heart failure.  She presents today for 38-month follow-up visit.  For the last 3 months patient is doing well from a cardiovascular standpoint.  Denies heart failure symptoms or anginal discomfort.  She continues to lose weight with lifestyle modifications.  She has done well on the lower dose of Entresto 24/26 mg p.o. twice daily.  ALLERGIES: Allergies  Allergen Reactions   Dilaudid [Hydromorphone Hcl] Other (See Comments)    Flushing, hallucinations   Gabapentin Other (See Comments)    RLS with burning   Lipitor [Atorvastatin Calcium] Other (See Comments)    Muscle aches    Lovastatin Other (See Comments)    Muscle pain   Zetia [Ezetimibe] Other (See Comments)    Muscles aches   Zocor [Simvastatin] Other (See Comments)    Muscle Aches     MEDICATION LIST PRIOR TO VISIT: Current Outpatient Medications on File Prior to Visit  Medication Sig Dispense Refill   acetaminophen (TYLENOL) 650 MG CR tablet Take 1,300 mg by mouth every 8 (eight) hours as needed for pain.     bumetanide (BUMEX) 1 MG tablet Take 1 tablet (1 mg total) by mouth daily. 90 tablet 3   colchicine 0.6 MG tablet  Take 0.6 mg by mouth daily.     gabapentin (NEURONTIN) 100 MG capsule Take 100 mg by mouth at bedtime.     levothyroxine (SYNTHROID) 50 MCG tablet Take 1 tablet by mouth daily.     metoprolol succinate (TOPROL XL) 25 MG 24 hr tablet Take 1 tablet (25 mg total) by mouth daily. 90 tablet 0   Multiple Vitamins-Minerals (CENTRUM SILVER PO) Take 1 tablet by mouth daily.     Pitavastatin Calcium (LIVALO) 2 MG TABS Take 1 tablet (2 mg total) by mouth daily. 90 tablet 3   Rivaroxaban (XARELTO) 15 MG TABS tablet Take 15 mg by mouth daily with supper.     sacubitril-valsartan (ENTRESTO) 49-51 MG Take 1 tablet by mouth 2 (two) times daily.     No current facility-administered medications on file prior to visit.    PAST MEDICAL HISTORY: Past Medical History:  Diagnosis Date   Borderline diabetes    Chronic back pain    Chronic lower back pain    Clotting disorder (HCC)    Deep vein thrombosis (DVT) (HCC)    DVT, lower extremity (HCC) 01/2002; 06/2003   RLE; LLE   Enlarged thyroid gland    Familial Mediterranean fever (HCC)    Fibromyalgia    Fibromyalgia    Gallstones    GERD (gastroesophageal reflux disease)    GERD (gastroesophageal reflux disease)    H/O hiatal hernia    History of stomach ulcers    Hyperlipidemia    Hypertension    Hypothyroidism  Insomnia    Migraine    "I've had 4; last time was when I was in my ?30's"   Osteoarthritis    Phlebitis    LLE   Pneumonia    Pulmonary embolism (HCC) 2003   Shortness of breath on exertion    Tinnitus     PAST SURGICAL HISTORY: Past Surgical History:  Procedure Laterality Date   ABDOMINAL HYSTERECTOMY  1972   APPENDECTOMY  1960   bladder tack     BREAST BIOPSY     left   CARPAL TUNNEL RELEASE  1989   right   CATARACT EXTRACTION, BILATERAL  ~ 2011   CHOLECYSTECTOMY  09/12/2011   Procedure: LAPAROSCOPIC CHOLECYSTECTOMY WITH INTRAOPERATIVE CHOLANGIOGRAM;  Surgeon: Kandis Cocking, MD;  Location: [facility];  Service: General;   Laterality: N/A;   excision of goiter     EXTERNAL EAR SURGERY     x3   HERNIA REPAIR  1970's   ventral   KNEE ARTHROSCOPY Left 12/05/2019   Procedure: ARTHROSCOPY KNEE AND DEBRIDEMENT, partial medial and lateral meniscectomy, left femoral patella chondroplasty;  Surgeon: Jodi Geralds, MD;  Location: [facility];  Service: Orthopedics;  Laterality: Left;   MASS EXCISION  12/2001   paravertebral chest mass   MIDDLE EAR SURGERY  1959; 1982; after 1982   mini thoracotomy  12/2001   THORACOTOMY  2003   procedure done   to remove growth in lung    TOTAL KNEE ARTHROPLASTY Right 01/30/2017   Procedure: RIGHT TOTAL KNEE ARTHROPLASTY;  Surgeon: Jodi Geralds, MD;  Location: [facility];  Service: Orthopedics;  Laterality: Right;  With adductor canal block    FAMILY HISTORY: The patient's family history includes Dementia in her sister; Heart disease in her mother; Hyperlipidemia in her mother; Hypertension in her mother; Parkinson's disease in her brother, sister, and sister; Stroke in her father.   SOCIAL HISTORY:  The patient  reports that she has never smoked. She has never used smokeless tobacco. She reports that she does not drink alcohol and does not use drugs.  Review of Systems  Constitutional: Positive for weight loss. Negative for chills, fever and malaise/fatigue.  HENT:  Negative for hoarse voice and nosebleeds.   Eyes:  Negative for discharge, double vision and pain.  Cardiovascular:  Positive for dyspnea on exertion (improving) and leg swelling (improving). Negative for chest pain, claudication, near-syncope, orthopnea, palpitations, paroxysmal nocturnal dyspnea and syncope.  Respiratory:  Negative for hemoptysis and shortness of breath.   Musculoskeletal:  Positive for joint pain and joint swelling. Negative for muscle cramps and myalgias.  Gastrointestinal:  Negative for abdominal pain, constipation, diarrhea, hematemesis, hematochezia, melena, nausea and vomiting.   Neurological:  Negative for dizziness and light-headedness.    PHYSICAL EXAM:    02/24/2023    1:13 PM 11/25/2022    2:50 PM 08/15/2022    1:21 PM  Vitals with BMI  Height 5\' 10"  5\' 10"  5\' 10"   Weight 199 lbs 201 lbs 13 oz 212 lbs  BMI 28.55 28.96 30.42  Systolic 134 114 629  Diastolic 79 67 75  Pulse 74 71 83    Physical Exam  Constitutional: No distress.  Age appropriate, hemodynamically stable, ambulates with a cane.  Neck: No JVD present.  Cardiovascular: Normal rate, regular rhythm, S1 normal, S2 normal, intact distal pulses and normal pulses. Exam reveals no gallop, no S3 and no S4.  No murmur heard. Pulmonary/Chest: Effort normal and breath sounds normal. No stridor. She has no  wheezes. She has no rales.  Abdominal: Soft. Bowel sounds are normal. She exhibits no distension. There is no abdominal tenderness.  Musculoskeletal:        General: Edema (Trace bilateral) present.     Cervical back: Neck supple.     Comments: Tender to touch bilateral lower extremity, compression stockings present.  Neurological: She is alert and oriented to person, place, and time. She has intact cranial nerves (2-12).  Skin: Skin is warm and moist.     CARDIAC DATABASE: EKG: 02/24/2023: Sinus rhythm, 74 bpm, right bundle branch block, right axis, RVH, low voltage in the limb leads.  Echocardiogram: 09/2011: LVEF 55-60% mildly dilated left atrium, PASP 33 mmHg.  11/14/2019: LVEF 45-50%, grade 2 diastolic dysfunction, elevated LAP, severe LAE, mild MR, mild TR, RVSP 21 mmHg.  01/07/2021: LVEF 45-50%, grade 1 diastolic impairment, normal LAP, see report for additional details.  07/09/2022: Normal LV systolic function with visual EF 60-65%. Left ventricle cavity is normal in size. Normal left ventricular wall thickness. Normal global wall motion. Normal diastolic filling pattern, normal LAP.  Tricuspid trace regurgitation. No evidence of pulmonary hypertension. Mild pulmonic  regurgitation. Compared to 01/07/2021 LVEF improved from 45-50% to 665%, G1DD is now normal otherwise no significant change.   Stress Testing:  Lexiscan/modified Bruce Tetrofosmin stress test 11/23/2019:  Lexiscan nuclear stress test performed using 1-day protocol. Stress EKG is non-diagnostic, as this is pharmacological stress test. In addition, rest and stress EKG showed sinus rhythm, anterosetpal nonspecific T wave inversion, frequent PVC's.  Decreased counts in inferior/inferolateral myocardium seen worse at rest, likely due to breast tissue attenuation with images performed in sitting position. Stress LVEF 48%.  Intermediate risk study. Recommend clinical correlation.    Heart Catheterization: None  LABORATORY DATA:    Latest Ref Rng & Units 12/16/2021    7:34 AM 11/06/2021    4:01 PM 02/02/2017    3:56 AM  CBC  WBC 4.0 - 10.5 K/uL  5.7  7.9   Hemoglobin 11.1 - 15.9 g/dL 40.9  81.1  91.4   Hematocrit 34.0 - 46.6 % 43.7  41.2  35.4   Platelets 150 - 400 K/uL  105  123        Latest Ref Rng & Units 11/25/2022    3:41 PM 08/12/2022    1:53 PM 07/22/2022    9:07 AM  CMP  Glucose 70 - 99 mg/dL 93  782  956   BUN 10 - 36 mg/dL 18  25  20    Creatinine 0.57 - 1.00 mg/dL 2.13  0.86  5.78   Sodium 134 - 144 mmol/L 142  142  141   Potassium 3.5 - 5.2 mmol/L 4.5  5.0  4.3   Chloride 96 - 106 mmol/L 102  107  102   CO2 20 - 29 mmol/L 25  24  22    Calcium 8.7 - 10.3 mg/dL 9.3  9.4  9.7     Lipid Panel     Component Value Date/Time   CHOL 159 12/16/2021 0735   TRIG 71 12/16/2021 0735   HDL 66 12/16/2021 0735   LDLCALC 79 12/16/2021 0735   LDLDIRECT 78 12/16/2021 0736   LABVLDL 14 12/16/2021 0735    Lab Results  Component Value Date   HGBA1C 5.8 (H) 01/23/2017   No components found for: "NTPROBNP" No results found for: "TSH"  Cardiac Panel (last 3 results) No results for input(s): "CKTOTAL", "CKMB", "TROPONINIHS", "RELINDX" in the last 72 hours.  IMPRESSION:  ICD-10-CM    1. Chronic heart failure with preserved ejection fraction (HFpEF) (HCC)  I50.32 EKG 12-Lead    2. Essential hypertension  I10     3. Mixed hyperlipidemia  E78.2     4. Hx pulmonary embolism  Z86.711     5. Hx of deep venous thrombosis  Z86.718        RECOMMENDATIONS: Debbie Richards is a 89 y.o. female whose past medical history and cardiovascular risk factors include: Chronic HFpEF, stage B, NYHA class II, hypertension, hypothyroidism, prediabetes, diabetic neuropathy, familial Mediterranean fever, history of DVT, history of PE, postmenopausal female, advanced age.   Chronic HFpEF Stage B, NYHA class II Recovered cardiomyopathy (01/2021 LVEF 45-50%, grade 1 DD and 06/2022 LVEF 60 to 65%, normal diastolic function). Has done well with the lower dose of Entresto 24/26 mg p.o. twice daily. Medications reconciled. She continues to implement lifestyle changes to manage her blood pressures and to lose weight.  She is congratulated on her efforts.  Mixed hyperlipidemia In the past, has been intolerant to Lipitor, Crestor, Zocor.   Continue Livalo.   Currently managed by primary care provider.  Essential hypertension Home and office blood pressures are well-controlled. No medication changes warranted at this time  Hx pulmonary embolism / Hx of deep venous thrombosis Currently on oral anticoagulation managed by PCP.   FINAL MEDICATION LIST END OF ENCOUNTER: No orders of the defined types were placed in this encounter.   Current Outpatient Medications:    acetaminophen (TYLENOL) 650 MG CR tablet, Take 1,300 mg by mouth every 8 (eight) hours as needed for pain., Disp: , Rfl:    bumetanide (BUMEX) 1 MG tablet, Take 1 tablet (1 mg total) by mouth daily., Disp: 90 tablet, Rfl: 3   colchicine 0.6 MG tablet, Take 0.6 mg by mouth daily., Disp: , Rfl:    gabapentin (NEURONTIN) 100 MG capsule, Take 100 mg by mouth at bedtime., Disp: , Rfl:    levothyroxine (SYNTHROID) 50 MCG tablet, Take 1  tablet by mouth daily., Disp: , Rfl:    metoprolol succinate (TOPROL XL) 25 MG 24 hr tablet, Take 1 tablet (25 mg total) by mouth daily., Disp: 90 tablet, Rfl: 0   Multiple Vitamins-Minerals (CENTRUM SILVER PO), Take 1 tablet by mouth daily., Disp: , Rfl:    Pitavastatin Calcium (LIVALO) 2 MG TABS, Take 1 tablet (2 mg total) by mouth daily., Disp: 90 tablet, Rfl: 3   Rivaroxaban (XARELTO) 15 MG TABS tablet, Take 15 mg by mouth daily with supper., Disp: , Rfl:    sacubitril-valsartan (ENTRESTO) 49-51 MG, Take 1 tablet by mouth 2 (two) times daily., Disp: , Rfl:   Orders Placed This Encounter  Procedures   EKG 12-Lead    --Continue cardiac medications as reconciled in final medication list. --Return in about 6 months (around 08/24/2023) for Follow up, heart failure management.. Or sooner if needed. --Continue follow-up with your primary care physician regarding the management of your other chronic comorbid conditions.  Patient's questions and concerns were addressed to her satisfaction. She voices understanding of the instructions provided during this encounter.   This note was created using a voice recognition software as a result there may be grammatical errors inadvertently enclosed that do not reflect the nature of this encounter. Every attempt is made to correct such errors.  Tessa Lerner, [place], [facility]  Pager:  [phone] Office: [phone]

## 2023-03-31 ENCOUNTER — Telehealth: Payer: Self-pay | Admitting: Cardiology

## 2023-03-31 NOTE — Telephone Encounter (Signed)
Left voicemail to return call to office.

## 2023-03-31 NOTE — Telephone Encounter (Signed)
Pt c/o medication issue:  1. Name of Medication: sacubitril-valsartan (ENTRESTO) 49-51 MG   2. How are you currently taking this medication (dosage and times per day)?    3. Are you having a reaction (difficulty breathing--STAT)? no  4. What is your medication issue? Patient calling in to say that Dr. Odis Hollingshead feels out the paperwork for her to get the medication. Calling in to for him to do that because she running out of medication. Please advise

## 2023-04-01 NOTE — Telephone Encounter (Signed)
Attempted to return call to patient, but no answer and no DPR on file. Left message asking that she call office back to let us know how much medication she has left on hand. Will need to explain the patient assistance application process here with our office is different than in the past with [place]. Call has been routed to [place] [employer] and pt assistance pool for assistance.

## 2023-04-03 ENCOUNTER — Encounter: Payer: Self-pay | Admitting: Cardiology

## 2023-04-03 ENCOUNTER — Telehealth: Payer: Self-pay | Admitting: Cardiology

## 2023-04-03 NOTE — Telephone Encounter (Signed)
Call placed to patient's daughter. Left message to call office

## 2023-04-03 NOTE — Telephone Encounter (Signed)
See previous phone note.  

## 2023-04-03 NOTE — Telephone Encounter (Signed)
I spoke with patient's daughter.  She reports patient has been receiving free Entresto which is sent to her home.  Daughter does not know if this is through a grant or the assistance program.  Does not known when it expires.  I told patient's daughter I would send message to assistance team to follow up on this and they would call her back

## 2023-04-03 NOTE — Telephone Encounter (Signed)
Spoke with patient and she stated she would like for me to give her daughter KIm a call to discuss the patient assistance.   Left voicemail to return call to office

## 2023-04-03 NOTE — Telephone Encounter (Signed)
Pt following up on phone note from 10/22. Please advise

## 2023-04-03 NOTE — Telephone Encounter (Signed)
Daughter returned RN's call. 

## 2023-04-06 ENCOUNTER — Other Ambulatory Visit (HOSPITAL_COMMUNITY): Payer: Self-pay

## 2023-04-06 ENCOUNTER — Telehealth: Payer: Self-pay

## 2023-04-06 NOTE — Telephone Encounter (Signed)
Patient Advocate Encounter   The patient was approved for a [employer] grant that will help cover the cost of ENTRESTO Total amount awarded, $10,000.  Effective: 03/07/23 - 03/05/24   BIN [id number] PCN [id number] Group [id number] ID [id number]   Patient informed via Dorcas Carrow, CPhT  Pharmacy Patient Advocate Specialist  Direct Number: [phone] Fax: [phone]

## 2023-04-06 NOTE — Telephone Encounter (Signed)
Please send entresto script to  [facility] [id number] - [place], [place] - [address]    With grant billing in rx comments BIN [id number], PCN [id number], Group [id number], ID [id number]

## 2023-04-16 ENCOUNTER — Telehealth: Payer: Self-pay | Admitting: Cardiology

## 2023-04-16 DIAGNOSIS — I5042 Chronic combined systolic (congestive) and diastolic (congestive) heart failure: Secondary | ICD-10-CM

## 2023-04-16 MED ORDER — METOPROLOL SUCCINATE ER 25 MG PO TB24
25.0000 mg | ORAL_TABLET | Freq: Every day | ORAL | 2 refills | Status: DC
Start: 2023-04-16 — End: 2024-04-07

## 2023-04-16 NOTE — Telephone Encounter (Signed)
*  STAT* If patient is at the pharmacy, call can be transferred to refill team.   1. Which medications need to be refilled? (please list name of each medication and dose if known)  metoprolol succinate (TOPROL XL) 25 MG 24 hr tablet    2. Would you like to learn more about the convenience, safety, & potential cost savings by using the [facility]?      3. Are you open to using the [facility] (Type [facility].  ).   4. Which pharmacy/location (including street and city if local pharmacy) is medication to be sent to? [facility] [id number] - [place], [place] - [address]   Patient only has two pills left.    5. Do they need a 30 day or 90 day supply? 90 day

## 2023-04-20 ENCOUNTER — Telehealth: Payer: Self-pay | Admitting: Cardiology

## 2023-04-20 NOTE — Telephone Encounter (Signed)
Patient left patient assistant paperwork up front I put it in the pink folder for you.

## 2023-04-21 NOTE — Telephone Encounter (Signed)
Pt's daughter dropped off paperwork and a note was sent with the application. Please contact daughter, I only send what patients drop off at the office. Thanks

## 2023-04-21 NOTE — Telephone Encounter (Signed)
Please see encounter from 04/06/23. Patient already has PAP for Entresto.

## 2023-04-21 NOTE — Telephone Encounter (Signed)
Pt's [employer] patient assistance application was scanned to [employer] email. FYI

## 2023-04-23 DIAGNOSIS — M1712 Unilateral primary osteoarthritis, left knee: Secondary | ICD-10-CM | POA: Diagnosis not present

## 2023-05-04 ENCOUNTER — Encounter: Payer: Self-pay | Admitting: Cardiology

## 2023-05-05 DIAGNOSIS — D1800 Hemangioma unspecified site: Secondary | ICD-10-CM | POA: Diagnosis not present

## 2023-05-06 NOTE — Telephone Encounter (Signed)
Please see 10/28 encounter. Patient was approved for a [employer] grant to completely cover the cost of Entresto. They do not need to complete a [employer] application. Patient was made aware of the grant approval.

## 2023-05-27 ENCOUNTER — Encounter: Payer: Self-pay | Admitting: Cardiology

## 2023-05-28 ENCOUNTER — Other Ambulatory Visit: Payer: Self-pay

## 2023-05-28 ENCOUNTER — Telehealth: Payer: Self-pay | Admitting: Cardiology

## 2023-05-28 MED ORDER — ENTRESTO 49-51 MG PO TABS: 1.0000 | ORAL_TABLET | Freq: Two times a day (BID) | ORAL | 3 refills | Status: DC

## 2023-05-28 NOTE — Telephone Encounter (Signed)
Pt c/o medication issue:  1. Name of Medication:   sacubitril-valsartan (ENTRESTO) 49-51 MG   2. How are you currently taking this medication (dosage and times per day)?   As prescribed  3. Are you having a reaction (difficulty breathing--STAT)?   4. What is your medication issue?   Patient stated she is only has 10 pills left of this medication and will need to get a refill.  Patient noted this medication come from the company.

## 2023-06-08 ENCOUNTER — Telehealth: Payer: Self-pay | Admitting: Cardiology

## 2023-06-08 MED ORDER — ENTRESTO 49-51 MG PO TABS: 1.0000 | ORAL_TABLET | Freq: Two times a day (BID) | ORAL | 3 refills | Status: DC

## 2023-06-08 NOTE — Telephone Encounter (Signed)
Spoke with pt and only has 1 pill left Per pt takes 1/2 tab twice daily  Entresto 49-51 mg 1 bid called in to [facility] pharmacy in [place]  Pt qualifies for grant for this med to be covered  Per pt will take 1/2 tab twice daily as pt has been doing this per conversation

## 2023-06-08 NOTE — Telephone Encounter (Signed)
Patient calling the office for samples of medication:   1.  What medication and dosage are you requesting samples for?  sacubitril-valsartan (ENTRESTO) 49-51 MG   2.  Are you currently out of this medication?   Patient stated she is out of this medication and has not received her prescription from [facility].  Patient wants to get samples.

## 2023-06-09 ENCOUNTER — Other Ambulatory Visit: Payer: Self-pay

## 2023-06-24 DIAGNOSIS — Z9049 Acquired absence of other specified parts of digestive tract: Secondary | ICD-10-CM | POA: Diagnosis not present

## 2023-06-24 DIAGNOSIS — M542 Cervicalgia: Secondary | ICD-10-CM | POA: Diagnosis not present

## 2023-06-24 DIAGNOSIS — Z86718 Personal history of other venous thrombosis and embolism: Secondary | ICD-10-CM | POA: Diagnosis not present

## 2023-06-24 DIAGNOSIS — S0990XA Unspecified injury of head, initial encounter: Secondary | ICD-10-CM | POA: Diagnosis not present

## 2023-06-24 DIAGNOSIS — M5134 Other intervertebral disc degeneration, thoracic region: Secondary | ICD-10-CM | POA: Diagnosis not present

## 2023-06-24 DIAGNOSIS — Z7901 Long term (current) use of anticoagulants: Secondary | ICD-10-CM | POA: Diagnosis not present

## 2023-06-24 DIAGNOSIS — W19XXXA Unspecified fall, initial encounter: Secondary | ICD-10-CM | POA: Diagnosis not present

## 2023-06-24 DIAGNOSIS — I10 Essential (primary) hypertension: Secondary | ICD-10-CM | POA: Diagnosis not present

## 2023-06-24 DIAGNOSIS — M47814 Spondylosis without myelopathy or radiculopathy, thoracic region: Secondary | ICD-10-CM | POA: Diagnosis not present

## 2023-06-24 DIAGNOSIS — W1830XA Fall on same level, unspecified, initial encounter: Secondary | ICD-10-CM | POA: Diagnosis not present

## 2023-06-24 DIAGNOSIS — S199XXA Unspecified injury of neck, initial encounter: Secondary | ICD-10-CM | POA: Diagnosis not present

## 2023-06-24 DIAGNOSIS — Z79890 Hormone replacement therapy (postmenopausal): Secondary | ICD-10-CM | POA: Diagnosis not present

## 2023-06-24 DIAGNOSIS — M4184 Other forms of scoliosis, thoracic region: Secondary | ICD-10-CM | POA: Diagnosis not present

## 2023-06-24 DIAGNOSIS — Z043 Encounter for examination and observation following other accident: Secondary | ICD-10-CM | POA: Diagnosis not present

## 2023-06-24 DIAGNOSIS — Z79899 Other long term (current) drug therapy: Secondary | ICD-10-CM | POA: Diagnosis not present

## 2023-07-02 DIAGNOSIS — M25562 Pain in left knee: Secondary | ICD-10-CM | POA: Diagnosis not present

## 2023-07-02 DIAGNOSIS — M7918 Myalgia, other site: Secondary | ICD-10-CM | POA: Diagnosis not present

## 2023-07-02 DIAGNOSIS — M542 Cervicalgia: Secondary | ICD-10-CM | POA: Diagnosis not present

## 2023-07-02 DIAGNOSIS — M546 Pain in thoracic spine: Secondary | ICD-10-CM | POA: Diagnosis not present

## 2023-08-04 ENCOUNTER — Encounter: Payer: Self-pay | Admitting: Cardiology

## 2023-08-18 ENCOUNTER — Encounter: Payer: Self-pay | Admitting: Cardiology

## 2023-08-25 ENCOUNTER — Encounter: Payer: Self-pay | Admitting: Cardiology

## 2023-08-25 ENCOUNTER — Ambulatory Visit: Payer: PRIVATE HEALTH INSURANCE | Attending: Cardiology | Admitting: Cardiology

## 2023-08-25 VITALS — BP 120/64 | HR 77 | Resp 16 | Ht 70.0 in | Wt 200.0 lb

## 2023-08-25 DIAGNOSIS — Z86718 Personal history of other venous thrombosis and embolism: Secondary | ICD-10-CM | POA: Diagnosis not present

## 2023-08-25 DIAGNOSIS — Z86711 Personal history of pulmonary embolism: Secondary | ICD-10-CM | POA: Diagnosis not present

## 2023-08-25 DIAGNOSIS — I5032 Chronic diastolic (congestive) heart failure: Secondary | ICD-10-CM

## 2023-08-25 DIAGNOSIS — E782 Mixed hyperlipidemia: Secondary | ICD-10-CM

## 2023-08-25 DIAGNOSIS — I10 Essential (primary) hypertension: Secondary | ICD-10-CM | POA: Diagnosis not present

## 2023-08-25 NOTE — Progress Notes (Signed)
 Cardiology Office Note:  .   Date:  08/25/2023  ID:  Debbie Richards, DOB 1930/10/11, MRN [id number] PCP:  Debbie Rua, MD  Former Cardiology Providers: Debbie Noe, MD, Debbie Reichert, MD, Debbie [place], APRN, FNP-C  [facility] Providers Cardiologist:  Debbie Lerner, DO , [facility] (established care 11/10/2019) Electrophysiologist:  None  Click to update primary MD,subspecialty MD or APP then REFRESH:1}    Chief Complaint  Patient presents with   Follow-up    6 month, Heart failure    History of Present Illness: .   Debbie Richards is a 89 y.o. Caucasian female whose past medical history and cardiovascular risk factors includes: Chronic HFpEF, stage B, NYHA class II, hypertension, hypothyroidism, prediabetes, diabetic neuropathy, familial Mediterranean fever, history of DVT, history of PE, postmenopausal female, advanced age.   Patient being followed by the practice given her history of combined systolic and diastolic heart failure.  She is here for 32-month follow-up visit.  Since last office visit patient denies any anginal chest pain or heart failure symptoms.  Patient states that she is feeling tired, fatigued, and decreased endurance.  She had a mechanical fall and because she is on a blood thinner she did go to the hospital to be evaluated.  No hospitalizations for congestive heart failure.  Weight remains stable over last 6 months.  Patient's systolic blood pressures are usually less than 120 mmHg.  She is currently on Entresto 49/51 mg 0.5 tablets twice daily, to help with medication cost.  In the past I recommended holding her medications if SBP is less than 100 mmHg.    Review of Systems: .   Review of Systems  Constitutional: Positive for malaise/fatigue.  Cardiovascular:  Positive for dyspnea on exertion (chronic and stable). Negative for chest pain, claudication, irregular heartbeat, leg swelling, near-syncope, orthopnea, palpitations, paroxysmal nocturnal dyspnea  and syncope.  Hematologic/Lymphatic: Negative for bleeding problem.  Musculoskeletal:  Positive for falls (did go to ER).    Studies Reviewed:   EKG: EKG Interpretation Date/Time:  Tuesday August 25 2023 09:16:29 EDT Ventricular Rate:  77 PR Interval:  196 QRS Duration:  130 QT Interval:  434 QTC Calculation: 491 R Axis:   35  Text Interpretation: Normal sinus rhythm Right bundle branch block When compared with ECG of 06-Nov-2021 17:14, No significant change since last tracing Confirmed by Debbie Richards [phone]) on 08/25/2023 9:26:17 AM  Echocardiogram: 09/2011: LVEF 55-60% mildly dilated left atrium, PASP 33 mmHg.   11/14/2019: LVEF 45-50%, grade 2 diastolic dysfunction, elevated LAP, severe LAE, mild MR, mild TR, RVSP 21 mmHg.   01/07/2021: LVEF 45-50%, grade 1 diastolic impairment, normal LAP, see report for additional details.   07/09/2022: Normal LV systolic function with visual EF 60-65%. Left ventricle cavity is normal in size. Normal left ventricular wall thickness. Normal global wall motion. Normal diastolic filling pattern, normal LAP.  Tricuspid trace regurgitation. No evidence of pulmonary hypertension. Mild pulmonic regurgitation. Compared to 01/07/2021 LVEF improved from 45-50% to 665%, G1DD is now normal otherwise no significant change.  Stress Testing: Lexiscan/modified Bruce Tetrofosmin stress test 11/23/2019: Intermediate risk study. Recommend clinical correlation.   Cardiac monitor: NA  RADIOLOGY: NA  Risk Assessment/Calculations:   NA   Labs:       Latest Ref Rng & Units 12/16/2021    7:34 AM 11/06/2021    4:01 PM 02/02/2017    3:56 AM  CBC  WBC 4.0 - 10.5 K/uL  5.7  7.9   Hemoglobin  11.1 - 15.9 g/dL 16.1  09.6  04.5   Hematocrit 34.0 - 46.6 % 43.7  41.2  35.4   Platelets 150 - 400 K/uL  105  123        Latest Ref Rng & Units 11/25/2022    3:41 PM 08/12/2022    1:53 PM 07/22/2022    9:07 AM  BMP  Glucose 70 - 99 mg/dL 93  409  811   BUN 10 - 36  mg/dL 18  25  20    Creatinine 0.57 - 1.00 mg/dL 9.14  7.82  9.56   BUN/Creat Ratio 12 - 28 21  27  19    Sodium 134 - 144 mmol/L 142  142  141   Potassium 3.5 - 5.2 mmol/L 4.5  5.0  4.3   Chloride 96 - 106 mmol/L 102  107  102   CO2 20 - 29 mmol/L 25  24  22    Calcium 8.7 - 10.3 mg/dL 9.3  9.4  9.7       Latest Ref Rng & Units 11/25/2022    3:41 PM 08/12/2022    1:53 PM 07/22/2022    9:07 AM  CMP  Glucose 70 - 99 mg/dL 93  213  086   BUN 10 - 36 mg/dL 18  25  20    Creatinine 0.57 - 1.00 mg/dL 5.78  4.69  6.29   Sodium 134 - 144 mmol/L 142  142  141   Potassium 3.5 - 5.2 mmol/L 4.5  5.0  4.3   Chloride 96 - 106 mmol/L 102  107  102   CO2 20 - 29 mmol/L 25  24  22    Calcium 8.7 - 10.3 mg/dL 9.3  9.4  9.7     Lab Results  Component Value Date   CHOL 159 12/16/2021   HDL 66 12/16/2021   LDLCALC 79 12/16/2021   LDLDIRECT 78 12/16/2021   TRIG 71 12/16/2021   No results for input(s): "LIPOA" in the last 8760 hours. No components found for: "NTPROBNP" No results for input(s): "PROBNP" in the last 8760 hours. No results for input(s): "TSH" in the last 8760 hours.  Physical Exam:    Today's Vitals   08/25/23 0914  BP: 120/64  Pulse: 77  Resp: 16  SpO2: 93%  Weight: 200 lb (90.7 kg)  Height: 5\' 10"  (1.778 m)   Body mass index is 28.7 kg/m. Wt Readings from Last 3 Encounters:  08/25/23 200 lb (90.7 kg)  02/24/23 199 lb (90.3 kg)  11/25/22 201 lb 12.8 oz (91.5 kg)    Physical Exam  Constitutional: No distress.  Age appropriate, hemodynamically stable, ambulates with a cane.  Neck: No JVD present.  Cardiovascular: Normal rate, regular rhythm, S1 normal, S2 normal, intact distal pulses and normal pulses. Exam reveals no gallop, no S3 and no S4.  No murmur heard. Pulmonary/Chest: Effort normal and breath sounds normal. No stridor. She has no wheezes. She has no rales.  Abdominal: Soft. Bowel sounds are normal. She exhibits no distension. There is no abdominal tenderness.   Musculoskeletal:        General: Edema (Trace bilateral) present.     Cervical back: Neck supple.     Comments: Tender to touch bilateral lower extremity, compression stockings present.  Neurological: She is alert and oriented to person, place, and time. She has intact cranial nerves (2-12).  Skin: Skin is warm and moist.   Impression & Recommendation(s):  Impression:   ICD-10-CM   1.  Chronic heart failure with preserved ejection fraction (HFpEF) (HCC)  I50.32 EKG 12-Lead    2. Essential hypertension  I10     3. Mixed hyperlipidemia  E78.2     4. Hx pulmonary embolism  Z86.711     5. Hx of deep venous thrombosis  Z86.718        Recommendation(s):  Chronic heart failure with preserved ejection fraction (HFpEF) (HCC) Stage B, NYHA class II Overall euvolemic on physical examination, weight remains stable in the last 6 months. Recovered cardiomyopathy (01/2021 LVEF 45-50%, grade 1 DD and 06/2022 LVEF 60 to 65%, normal diastolic function).  Currently on Entresto 49/51 mg half a tablet twice daily. Given her tiredness/fatigue recommended transitioning her off of Entresto to losartan.  However, daughter is concerned that discontinuation of Entresto may lead to fluid retention.  For now recommending holding GDMT if her systolic blood pressure less than 100 mmHg.  Essential hypertension Office blood pressures are very well-controlled. Continue Bumex 1 mg p.o. daily. Continue Toprol-XL 25 mg p.o. daily. Continue Entresto 49/51 mg 0.5 tablets p.o. twice daily  Mixed hyperlipidemia Continue Livalo 2 mg p.o. daily  Hx pulmonary embolism Hx of deep venous thrombosis Currently on Xarelto 15 mg p.o. q. supper, medication managed by PCP  Recommend that she follows up with PCP with regards to checking noncardiac causes of feeling tired and fatigued-consider checking vitamin D levels, B12/folate, TSH, etc.  Orders Placed:  Orders Placed This Encounter  Procedures   EKG 12-Lead      Final Medication List:   No orders of the defined types were placed in this encounter.   There are no discontinued medications.   Current Outpatient Medications:    acetaminophen (TYLENOL) 650 MG CR tablet, Take 1,300 mg by mouth every 8 (eight) hours as needed for pain., Disp: , Rfl:    bumetanide (BUMEX) 1 MG tablet, Take 1 tablet (1 mg total) by mouth daily., Disp: 90 tablet, Rfl: 3   colchicine 0.6 MG tablet, Take 0.6 mg by mouth daily., Disp: , Rfl:    gabapentin (NEURONTIN) 100 MG capsule, Take 100 mg by mouth at bedtime., Disp: , Rfl:    levothyroxine (SYNTHROID) 50 MCG tablet, Take 1 tablet by mouth daily., Disp: , Rfl:    metoprolol succinate (TOPROL XL) 25 MG 24 hr tablet, Take 1 tablet (25 mg total) by mouth daily., Disp: 90 tablet, Rfl: 2   Multiple Vitamins-Minerals (CENTRUM SILVER PO), Take 1 tablet by mouth daily., Disp: , Rfl:    Pitavastatin Calcium (LIVALO) 2 MG TABS, Take 1 tablet (2 mg total) by mouth daily., Disp: 90 tablet, Rfl: 3   Rivaroxaban (XARELTO) 15 MG TABS tablet, Take 15 mg by mouth daily with supper., Disp: , Rfl:    sacubitril-valsartan (ENTRESTO) 49-51 MG, Take 1 tablet by mouth 2 (two) times daily., Disp: 180 tablet, Rfl: 3  Consent:   NA  Disposition:   46-month follow-up sooner if needed  Her questions and concerns were addressed to her satisfaction. She voices understanding of the recommendations provided during this encounter.    Signed, Debbie Lerner, DO, [facility]  [facility]  [address] [place], [place] [address] 08/25/2023 9:44 AM

## 2023-08-25 NOTE — Patient Instructions (Signed)
 Medication Instructions:  Your physician recommends that you continue on your current medications as directed. Please refer to the Current Medication list given to you today.  *If you need a refill on your cardiac medications before your next appointment, please call your pharmacy*  Lab Work: None ordered today. If you have labs (blood work) drawn today and your tests are completely normal, you will receive your results only by: MyChart Message (if you have MyChart) OR A paper copy in the mail If you have any lab test that is abnormal or we need to change your treatment, we will call you to review the results.  Testing/Procedures: None ordered today.  Follow-Up: At [facility], you and your health needs are our priority.  As part of our continuing mission to provide you with exceptional heart care, we have created designated Provider Care Teams.  These Care Teams include your primary Cardiologist (physician) and Advanced Practice Providers (APPs -  Physician Assistants and Nurse Practitioners) who all work together to provide you with the care you need, when you need it.  We recommend signing up for the patient portal called "MyChart".  Sign up information is provided on this After Visit Summary.  MyChart is used to connect with patients for Virtual Visits (Telemedicine).  Patients are able to view lab/test results, encounter notes, upcoming appointments, etc.  Non-urgent messages can be sent to your provider as well.   To learn more about what you can do with MyChart, go to [email or web address].    Your next appointment:   6 month(s)  The format for your next appointment:   In Person  Provider:   Tessa Lerner, DO {

## 2023-09-17 DIAGNOSIS — D6869 Other thrombophilia: Secondary | ICD-10-CM | POA: Diagnosis not present

## 2023-09-17 DIAGNOSIS — E042 Nontoxic multinodular goiter: Secondary | ICD-10-CM | POA: Diagnosis not present

## 2023-09-17 DIAGNOSIS — I5042 Chronic combined systolic (congestive) and diastolic (congestive) heart failure: Secondary | ICD-10-CM | POA: Diagnosis not present

## 2023-09-17 DIAGNOSIS — R7303 Prediabetes: Secondary | ICD-10-CM | POA: Diagnosis not present

## 2023-09-17 DIAGNOSIS — E850 Non-neuropathic heredofamilial amyloidosis: Secondary | ICD-10-CM | POA: Diagnosis not present

## 2023-09-17 DIAGNOSIS — E7800 Pure hypercholesterolemia, unspecified: Secondary | ICD-10-CM | POA: Diagnosis not present

## 2023-09-17 DIAGNOSIS — I4891 Unspecified atrial fibrillation: Secondary | ICD-10-CM | POA: Diagnosis not present

## 2023-09-17 DIAGNOSIS — I10 Essential (primary) hypertension: Secondary | ICD-10-CM | POA: Diagnosis not present

## 2023-09-17 DIAGNOSIS — M4602 Spinal enthesopathy, cervical region: Secondary | ICD-10-CM | POA: Diagnosis not present

## 2023-09-18 DIAGNOSIS — I10 Essential (primary) hypertension: Secondary | ICD-10-CM | POA: Diagnosis not present

## 2023-09-18 DIAGNOSIS — E7800 Pure hypercholesterolemia, unspecified: Secondary | ICD-10-CM | POA: Diagnosis not present

## 2023-09-18 DIAGNOSIS — R7303 Prediabetes: Secondary | ICD-10-CM | POA: Diagnosis not present

## 2023-09-18 DIAGNOSIS — E042 Nontoxic multinodular goiter: Secondary | ICD-10-CM | POA: Diagnosis not present

## 2023-10-02 NOTE — Telephone Encounter (Signed)
 Entresto  30 day free card has been discarded since patient has not picked it up from our office.

## 2023-10-13 DIAGNOSIS — L570 Actinic keratosis: Secondary | ICD-10-CM | POA: Diagnosis not present

## 2023-10-13 DIAGNOSIS — Z85828 Personal history of other malignant neoplasm of skin: Secondary | ICD-10-CM | POA: Diagnosis not present

## 2023-10-13 DIAGNOSIS — L821 Other seborrheic keratosis: Secondary | ICD-10-CM | POA: Diagnosis not present

## 2023-10-13 DIAGNOSIS — D1801 Hemangioma of skin and subcutaneous tissue: Secondary | ICD-10-CM | POA: Diagnosis not present

## 2023-11-07 DIAGNOSIS — I4891 Unspecified atrial fibrillation: Secondary | ICD-10-CM | POA: Diagnosis not present

## 2023-11-07 DIAGNOSIS — I5042 Chronic combined systolic (congestive) and diastolic (congestive) heart failure: Secondary | ICD-10-CM | POA: Diagnosis not present

## 2023-11-07 DIAGNOSIS — E7800 Pure hypercholesterolemia, unspecified: Secondary | ICD-10-CM | POA: Diagnosis not present

## 2023-11-07 DIAGNOSIS — I10 Essential (primary) hypertension: Secondary | ICD-10-CM | POA: Diagnosis not present

## 2023-11-13 DIAGNOSIS — H524 Presbyopia: Secondary | ICD-10-CM | POA: Diagnosis not present

## 2023-11-13 DIAGNOSIS — H5203 Hypermetropia, bilateral: Secondary | ICD-10-CM | POA: Diagnosis not present

## 2023-11-13 DIAGNOSIS — H52223 Regular astigmatism, bilateral: Secondary | ICD-10-CM | POA: Diagnosis not present

## 2023-12-07 DIAGNOSIS — I10 Essential (primary) hypertension: Secondary | ICD-10-CM | POA: Diagnosis not present

## 2023-12-07 DIAGNOSIS — I5042 Chronic combined systolic (congestive) and diastolic (congestive) heart failure: Secondary | ICD-10-CM | POA: Diagnosis not present

## 2023-12-07 DIAGNOSIS — I4891 Unspecified atrial fibrillation: Secondary | ICD-10-CM | POA: Diagnosis not present

## 2023-12-07 DIAGNOSIS — E7800 Pure hypercholesterolemia, unspecified: Secondary | ICD-10-CM | POA: Diagnosis not present

## 2023-12-24 ENCOUNTER — Other Ambulatory Visit: Payer: Self-pay | Admitting: Cardiology

## 2023-12-24 DIAGNOSIS — I5032 Chronic diastolic (congestive) heart failure: Secondary | ICD-10-CM

## 2024-01-07 DIAGNOSIS — I4891 Unspecified atrial fibrillation: Secondary | ICD-10-CM | POA: Diagnosis not present

## 2024-01-07 DIAGNOSIS — E7800 Pure hypercholesterolemia, unspecified: Secondary | ICD-10-CM | POA: Diagnosis not present

## 2024-01-07 DIAGNOSIS — I10 Essential (primary) hypertension: Secondary | ICD-10-CM | POA: Diagnosis not present

## 2024-01-07 DIAGNOSIS — I5042 Chronic combined systolic (congestive) and diastolic (congestive) heart failure: Secondary | ICD-10-CM | POA: Diagnosis not present

## 2024-02-07 DIAGNOSIS — I4891 Unspecified atrial fibrillation: Secondary | ICD-10-CM | POA: Diagnosis not present

## 2024-02-07 DIAGNOSIS — E7800 Pure hypercholesterolemia, unspecified: Secondary | ICD-10-CM | POA: Diagnosis not present

## 2024-02-07 DIAGNOSIS — I5042 Chronic combined systolic (congestive) and diastolic (congestive) heart failure: Secondary | ICD-10-CM | POA: Diagnosis not present

## 2024-02-07 DIAGNOSIS — I10 Essential (primary) hypertension: Secondary | ICD-10-CM | POA: Diagnosis not present

## 2024-02-22 ENCOUNTER — Encounter: Payer: Self-pay | Admitting: Cardiology

## 2024-02-22 ENCOUNTER — Ambulatory Visit: Payer: PRIVATE HEALTH INSURANCE | Attending: Cardiology | Admitting: Cardiology

## 2024-02-22 VITALS — BP 112/63 | HR 56 | Resp 16 | Ht 70.0 in | Wt 195.2 lb

## 2024-02-22 DIAGNOSIS — I5032 Chronic diastolic (congestive) heart failure: Secondary | ICD-10-CM

## 2024-02-22 DIAGNOSIS — Z86711 Personal history of pulmonary embolism: Secondary | ICD-10-CM

## 2024-02-22 DIAGNOSIS — E782 Mixed hyperlipidemia: Secondary | ICD-10-CM

## 2024-02-22 DIAGNOSIS — I10 Essential (primary) hypertension: Secondary | ICD-10-CM | POA: Diagnosis not present

## 2024-02-22 DIAGNOSIS — Z86718 Personal history of other venous thrombosis and embolism: Secondary | ICD-10-CM

## 2024-02-22 MED ORDER — SACUBITRIL-VALSARTAN 49-51 MG PO TABS: 1.0000 | ORAL_TABLET | Freq: Two times a day (BID) | ORAL | 3 refills | Status: AC

## 2024-02-22 NOTE — Patient Instructions (Addendum)
 Medication Instructions:  Your physician recommends that you continue on your current medications as directed. Please refer to the Current Medication list given to you today.  *If you need a refill on your cardiac medications before your next appointment, please call your pharmacy*  Lab Work: None ordered.  You may go to any [facility] Location for your lab work:  [facility] - [address] ([facility]) - [address] [phone] [address]  [place] - [address]   [place]  - [address]   [place] - [address] - [address] [employer]  - [address] [place] - [address] ([facility]   If you have labs (blood work) drawn today and your tests are completely normal, you will receive your results only by: [employer] (if you have MyChart)  If you have any lab test that is abnormal or we need to change your treatment, we will call you or send a MyChart message to review the results.  Testing/Procedures: None ordered.  Follow-Up: At [facility], you and your health needs are our priority.  As part of our continuing mission to provide you with exceptional heart care, we have created designated Provider Care Teams.  These Care Teams include your primary Cardiologist (physician) and Advanced Practice Providers (APPs -  Physician Assistants and Nurse Practitioners) who all work together to provide you with the care you need, when you need it.  Your next appointment:   6 months  The format for your next appointment:   In Person  Provider:   Madonna Large, DO

## 2024-02-22 NOTE — Progress Notes (Signed)
 Cardiology Office Note:  .   Date:  02/22/2024  ID:  Debbie Richards, DOB 01/05/1931, MRN [id number] PCP:  Nanci Senior, MD  Former Cardiology Providers: Dr. Claudene, Dr.Turner, Emmalene Lawrence, APRN, FNP-C  [facility] Providers Cardiologist:  Madonna Large, DO , [facility] (established care 11/10/2019) Electrophysiologist:  None  Click to update primary MD,subspecialty MD or APP then REFRESH:1}    Chief Complaint  Patient presents with   Chronic heart failure with preserved ejection fraction   Follow-up    History of Present Illness: .   Debbie Richards is a 89 y.o. Caucasian female whose past medical history and cardiovascular risk factors includes: Chronic HFpEF, stage B, NYHA class II, hypertension, hypothyroidism, prediabetes, diabetic neuropathy, familial Mediterranean fever, history of DVT, history of PE, postmenopausal female, advanced age.   Patient being followed by the practice given her history of combined systolic and diastolic heart failure.  She is here for 1-month follow-up visit.  Since last office visit patient denies any anginal chest pain or heart failure symptoms.  No hospitalizations or urgent care visits for cardiovascular reasons.  She has been compliant with her medical therapy.  Has lost approximately 5 pounds since last office visit physical endurance remains stable, enjoys trimming, shrubbery, ADLs, house tasks.  All blood pressures are well-controlled.   Review of Systems: .   Review of Systems  Constitutional: Positive for weight loss.  Cardiovascular:  Positive for dyspnea on exertion (Chronic and stable). Negative for chest pain, claudication, irregular heartbeat, leg swelling, near-syncope, orthopnea, palpitations, paroxysmal nocturnal dyspnea and syncope.  Hematologic/Lymphatic: Negative for bleeding problem.    Studies Reviewed:   EKG: EKG Interpretation Date/Time:  Monday February 22 2024 11:20:53 EDT Ventricular Rate:  57 PR  Interval:  198 QRS Duration:  142 QT Interval:  494 QTC Calculation: 480 R Axis:   130  Text Interpretation: Sinus bradycardia Right bundle branch block When compared with ECG of 25-Aug-2023 09:16, Since last tracing rate slower Confirmed by Large Madonna [phone]) on 02/22/2024 11:45:42 AM  Echocardiogram: 09/2011: LVEF 55-60% mildly dilated left atrium, PASP 33 mmHg.   11/14/2019: LVEF 45-50%, grade 2 diastolic dysfunction, elevated LAP, severe LAE, mild MR, mild TR, RVSP 21 mmHg.   01/07/2021: LVEF 45-50%, grade 1 diastolic impairment, normal LAP, see report for additional details.   07/09/2022: Normal LV systolic function with visual EF 60-65%. Left ventricle cavity is normal in size. Normal left ventricular wall thickness. Normal global wall motion. Normal diastolic filling pattern, normal LAP.  Tricuspid trace regurgitation. No evidence of pulmonary hypertension. Mild pulmonic regurgitation. Compared to 01/07/2021 LVEF improved from 45-50% to 665%, G1DD is now normal otherwise no significant change.  Stress Testing: Lexiscan /modified Bruce Tetrofosmin  stress test 11/23/2019: Intermediate risk study. Recommend clinical correlation.   Cardiac monitor: NA  RADIOLOGY: NA  Risk Assessment/Calculations:   NA   Labs:       Latest Ref Rng & Units 12/16/2021    7:34 AM 11/06/2021    4:01 PM 02/02/2017    3:56 AM  CBC  WBC 4.0 - 10.5 K/uL  5.7  7.9   Hemoglobin 11.1 - 15.9 g/dL 85.0  86.8  88.1   Hematocrit 34.0 - 46.6 % 43.7  41.2  35.4   Platelets 150 - 400 K/uL  105  123        Latest Ref Rng & Units 11/25/2022    3:41 PM 08/12/2022    1:53 PM 07/22/2022    9:07 AM  BMP  Glucose 70 - 99 mg/dL 93  891  888   BUN 10 - 36 mg/dL 18  25  20    Creatinine 0.57 - 1.00 mg/dL 9.13  9.07  8.96   BUN/Creat Ratio 12 - 28 21  27  19    Sodium 134 - 144 mmol/L 142  142  141   Potassium 3.5 - 5.2 mmol/L 4.5  5.0  4.3   Chloride 96 - 106 mmol/L 102  107  102   CO2 20 - 29 mmol/L 25  24  22     Calcium  8.7 - 10.3 mg/dL 9.3  9.4  9.7       Latest Ref Rng & Units 11/25/2022    3:41 PM 08/12/2022    1:53 PM 07/22/2022    9:07 AM  CMP  Glucose 70 - 99 mg/dL 93  891  888   BUN 10 - 36 mg/dL 18  25  20    Creatinine 0.57 - 1.00 mg/dL 9.13  9.07  8.96   Sodium 134 - 144 mmol/L 142  142  141   Potassium 3.5 - 5.2 mmol/L 4.5  5.0  4.3   Chloride 96 - 106 mmol/L 102  107  102   CO2 20 - 29 mmol/L 25  24  22    Calcium  8.7 - 10.3 mg/dL 9.3  9.4  9.7     Lab Results  Component Value Date   CHOL 159 12/16/2021   HDL 66 12/16/2021   LDLCALC 79 12/16/2021   LDLDIRECT 78 12/16/2021   TRIG 71 12/16/2021   No results for input(s): LIPOA in the last 8760 hours. No components found for: NTPROBNP No results for input(s): PROBNP in the last 8760 hours. No results for input(s): TSH in the last 8760 hours.  External Labs: Collected: September 18, 2023 [facility] database. Total cholesterol 157, triglycerides 64, HDL 63, LDL 81, Potassium 5. TSH 0.77  Physical Exam:    Today's Vitals   02/22/24 1117  BP: 112/63  Pulse: (!) 56  Resp: 16  SpO2: 98%  Weight: 195 lb 3.2 oz (88.5 kg)  Height: 5' 10 (1.778 m)   Body mass index is 28.01 kg/m. Wt Readings from Last 3 Encounters:  02/22/24 195 lb 3.2 oz (88.5 kg)  08/25/23 200 lb (90.7 kg)  02/24/23 199 lb (90.3 kg)    Physical Exam  Constitutional: No distress.  Age appropriate, hemodynamically stable, ambulates with a cane.  Neck: No JVD present.  Cardiovascular: Regular rhythm, S1 normal, S2 normal, intact distal pulses and normal pulses. Bradycardia present. Exam reveals no gallop, no S3 and no S4.  No murmur heard. Pulmonary/Chest: Effort normal and breath sounds normal. No stridor. She has no wheezes. She has no rales.  Abdominal: Soft. Bowel sounds are normal. She exhibits no distension. There is no abdominal tenderness.  Musculoskeletal:        General: Edema (Trace bilateral) present.     Cervical back: Neck supple.      Comments: Tender to touch bilateral lower extremity, compression stockings present.  Neurological: She is alert and oriented to person, place, and time. She has intact cranial nerves (2-12).  Skin: Skin is warm and moist.   Impression & Recommendation(s):  Impression:   ICD-10-CM   1. Chronic heart failure with preserved ejection fraction (HFpEF) (HCC)  I50.32 EKG 12-Lead    2. Essential hypertension  I10     3. Mixed hyperlipidemia  E78.2     4. Hx pulmonary embolism  Z86.711  5. Hx of deep venous thrombosis  Z86.718        Recommendation(s):  Chronic heart failure with preserved ejection fraction (HFpEF) (HCC) Stage B, NYHA class II Overall euvolemic on physical examination, weight remains stable in the last 6 months. Has lost 5 pounds since last office visit. Recovered cardiomyopathy (01/2021 LVEF 45-50%, grade 1 DD and 06/2022 LVEF 60 to 65%, normal diastolic function).  If she has soft blood pressures at home would recommend transitioning from Entresto  to losartan.  She will let us  know if this needs to happen.  In the past, we have tried to transition her to ARB but she was retained fluids and therefore she prefers to be on Entresto .   Hold off on additional GDMT for now due to BP Continue Bumex  1 mg p.o. daily. Continue Toprol -XL 25 mg p.o. daily. Continue Entresto  49/51 mg 0.5 tablets p.o. twice daily, help with medication cost.  Prescription refilled. Of note, did not tolerate Farxiga  well in the past.  Essential hypertension Office blood pressures are very well-controlled. Medications as discussed above  Mixed hyperlipidemia Continue Livalo  2 mg p.o. daily LDL 81 mg/dL as of April 2025 Cardiology following peripherally, managed by primary care provider.  Hx pulmonary embolism Hx of deep venous thrombosis Currently on Xarelto  15 mg p.o. q. supper, medication managed by PCP  Independently reviewed EKG 02/22/2024. Outside labs from [facility] database independently  reviewed/04/2024. Prescription drug management. Reviewed prior echocardiogram results as well as labs. Plan of care discussed with both patient and daughter at today's visit.  Orders Placed:  Orders Placed This Encounter  Procedures   EKG 12-Lead   Final Medication List:    Meds ordered this encounter  Medications   sacubitril -valsartan  (ENTRESTO ) 49-51 MG    Sig: Take 1 tablet by mouth 2 (two) times daily.    Dispense:  180 tablet    Refill:  3    Medications Discontinued During This Encounter  Medication Reason   sacubitril -valsartan  (ENTRESTO ) 49-51 MG Reorder     Current Outpatient Medications:    acetaminophen  (TYLENOL ) 650 MG CR tablet, Take 1,300 mg by mouth every 8 (eight) hours as needed for pain., Disp: , Rfl:    bumetanide  (BUMEX ) 1 MG tablet, TAKE 1 TABLET BY MOUTH DAILY, Disp: 90 tablet, Rfl: 2   colchicine  0.6 MG tablet, Take 0.6 mg by mouth daily., Disp: , Rfl:    gabapentin  (NEURONTIN ) 100 MG capsule, Take 100 mg by mouth at bedtime., Disp: , Rfl:    levothyroxine  (SYNTHROID ) 50 MCG tablet, Take 1 tablet by mouth daily., Disp: , Rfl:    metoprolol  succinate (TOPROL  XL) 25 MG 24 hr tablet, Take 1 tablet (25 mg total) by mouth daily., Disp: 90 tablet, Rfl: 2   Multiple Vitamins-Minerals (CENTRUM SILVER PO), Take 1 tablet by mouth daily., Disp: , Rfl:    Pitavastatin  Calcium  (LIVALO ) 2 MG TABS, Take 1 tablet (2 mg total) by mouth daily., Disp: 90 tablet, Rfl: 3   Rivaroxaban  (XARELTO ) 15 MG TABS tablet, Take 15 mg by mouth daily with supper., Disp: , Rfl:    sacubitril -valsartan  (ENTRESTO ) 49-51 MG, Take 1 tablet by mouth 2 (two) times daily., Disp: 180 tablet, Rfl: 3  Consent:   NA  Disposition:   52-month follow-up sooner if needed  Her questions and concerns were addressed to her satisfaction. She voices understanding of the recommendations provided during this encounter.    Signed, Madonna Large, DO, [facility]  [facility]  A Division of [facility]  [facility] [address], [place], [place] [address]   02/22/2024 5:45 PM

## 2024-03-08 DIAGNOSIS — E7800 Pure hypercholesterolemia, unspecified: Secondary | ICD-10-CM | POA: Diagnosis not present

## 2024-03-08 DIAGNOSIS — I10 Essential (primary) hypertension: Secondary | ICD-10-CM | POA: Diagnosis not present

## 2024-03-08 DIAGNOSIS — I4891 Unspecified atrial fibrillation: Secondary | ICD-10-CM | POA: Diagnosis not present

## 2024-03-08 DIAGNOSIS — I5042 Chronic combined systolic (congestive) and diastolic (congestive) heart failure: Secondary | ICD-10-CM | POA: Diagnosis not present

## 2024-03-31 DIAGNOSIS — I872 Venous insufficiency (chronic) (peripheral): Secondary | ICD-10-CM | POA: Diagnosis not present

## 2024-03-31 DIAGNOSIS — L720 Epidermal cyst: Secondary | ICD-10-CM | POA: Diagnosis not present

## 2024-04-07 ENCOUNTER — Other Ambulatory Visit: Payer: Self-pay | Admitting: Cardiology

## 2024-04-07 DIAGNOSIS — I5042 Chronic combined systolic (congestive) and diastolic (congestive) heart failure: Secondary | ICD-10-CM

## 2024-04-08 DIAGNOSIS — I10 Essential (primary) hypertension: Secondary | ICD-10-CM | POA: Diagnosis not present

## 2024-04-08 DIAGNOSIS — I4891 Unspecified atrial fibrillation: Secondary | ICD-10-CM | POA: Diagnosis not present

## 2024-04-08 DIAGNOSIS — E7800 Pure hypercholesterolemia, unspecified: Secondary | ICD-10-CM | POA: Diagnosis not present

## 2024-04-08 DIAGNOSIS — I5042 Chronic combined systolic (congestive) and diastolic (congestive) heart failure: Secondary | ICD-10-CM | POA: Diagnosis not present

## 2024-04-13 ENCOUNTER — Encounter: Payer: Self-pay | Admitting: Cardiology

## 2024-05-08 DIAGNOSIS — I4891 Unspecified atrial fibrillation: Secondary | ICD-10-CM | POA: Diagnosis not present

## 2024-05-08 DIAGNOSIS — I5042 Chronic combined systolic (congestive) and diastolic (congestive) heart failure: Secondary | ICD-10-CM | POA: Diagnosis not present

## 2024-05-08 DIAGNOSIS — I10 Essential (primary) hypertension: Secondary | ICD-10-CM | POA: Diagnosis not present

## 2024-05-08 DIAGNOSIS — E7800 Pure hypercholesterolemia, unspecified: Secondary | ICD-10-CM | POA: Diagnosis not present
